# Patient Record
Sex: Female | Born: 1980 | Race: Black or African American | Hispanic: No | Marital: Married | Smoking: Current every day smoker
Health system: Southern US, Community
[De-identification: ages and names within clinical notes are randomized; demographics above are authoritative.]

## PROBLEM LIST (undated history)

## (undated) DIAGNOSIS — IMO0002 Reserved for concepts with insufficient information to code with codable children: Secondary | ICD-10-CM

## (undated) DIAGNOSIS — K219 Gastro-esophageal reflux disease without esophagitis: Secondary | ICD-10-CM

## (undated) DIAGNOSIS — I209 Angina pectoris, unspecified: Secondary | ICD-10-CM

## (undated) DIAGNOSIS — D649 Anemia, unspecified: Secondary | ICD-10-CM

## (undated) DIAGNOSIS — J45909 Unspecified asthma, uncomplicated: Secondary | ICD-10-CM

## (undated) DIAGNOSIS — M545 Low back pain, unspecified: Secondary | ICD-10-CM

## (undated) DIAGNOSIS — I639 Cerebral infarction, unspecified: Secondary | ICD-10-CM

## (undated) DIAGNOSIS — R011 Cardiac murmur, unspecified: Secondary | ICD-10-CM

## (undated) DIAGNOSIS — F209 Schizophrenia, unspecified: Secondary | ICD-10-CM

## (undated) DIAGNOSIS — I82409 Acute embolism and thrombosis of unspecified deep veins of unspecified lower extremity: Secondary | ICD-10-CM

## (undated) DIAGNOSIS — I10 Essential (primary) hypertension: Secondary | ICD-10-CM

## (undated) DIAGNOSIS — G43909 Migraine, unspecified, not intractable, without status migrainosus: Secondary | ICD-10-CM

## (undated) DIAGNOSIS — F319 Bipolar disorder, unspecified: Secondary | ICD-10-CM

## (undated) DIAGNOSIS — E1151 Type 2 diabetes mellitus with diabetic peripheral angiopathy without gangrene: Secondary | ICD-10-CM

## (undated) DIAGNOSIS — E785 Hyperlipidemia, unspecified: Secondary | ICD-10-CM

## (undated) DIAGNOSIS — Q2112 Patent foramen ovale: Secondary | ICD-10-CM

## (undated) DIAGNOSIS — G8929 Other chronic pain: Secondary | ICD-10-CM

## (undated) DIAGNOSIS — E1165 Type 2 diabetes mellitus with hyperglycemia: Secondary | ICD-10-CM

## (undated) DIAGNOSIS — R569 Unspecified convulsions: Secondary | ICD-10-CM

## (undated) DIAGNOSIS — Q211 Atrial septal defect: Secondary | ICD-10-CM

## (undated) HISTORY — PX: VSD REPAIR: SHX276

## (undated) HISTORY — PX: HERNIA REPAIR: SHX51

## (undated) HISTORY — PX: UMBILICAL HERNIA REPAIR: SHX196

---

## 2006-05-11 HISTORY — PX: TUBAL LIGATION: SHX77

## 2010-07-03 ENCOUNTER — Emergency Department (HOSPITAL_COMMUNITY)
Admission: EM | Admit: 2010-07-03 | Discharge: 2010-07-03 | Disposition: A | Discharge status: Home / Self Care | Payer: Medicaid Other | Attending: Emergency Medicine | Admitting: Emergency Medicine

## 2010-07-03 DIAGNOSIS — R109 Unspecified abdominal pain: Secondary | ICD-10-CM | POA: Insufficient documentation

## 2010-07-03 DIAGNOSIS — R000 Tachycardia, unspecified: Secondary | ICD-10-CM | POA: Insufficient documentation

## 2010-07-03 DIAGNOSIS — R197 Diarrhea, unspecified: Secondary | ICD-10-CM | POA: Insufficient documentation

## 2010-07-03 DIAGNOSIS — A599 Trichomoniasis, unspecified: Secondary | ICD-10-CM | POA: Insufficient documentation

## 2010-07-03 DIAGNOSIS — Z79899 Other long term (current) drug therapy: Secondary | ICD-10-CM | POA: Insufficient documentation

## 2010-07-03 DIAGNOSIS — R112 Nausea with vomiting, unspecified: Secondary | ICD-10-CM | POA: Insufficient documentation

## 2010-07-03 LAB — COMPREHENSIVE METABOLIC PANEL
Alkaline Phosphatase: 87 U/L (ref 39–117)
BUN: 10 mg/dL (ref 6–23)
Glucose, Bld: 73 mg/dL (ref 70–99)
Potassium: 3.9 mEq/L (ref 3.5–5.1)
Total Bilirubin: 0.5 mg/dL (ref 0.3–1.2)
Total Protein: 7.5 g/dL (ref 6.0–8.3)

## 2010-07-03 LAB — POCT PREGNANCY, URINE: Preg Test, Ur: NEGATIVE

## 2010-07-03 LAB — DIFFERENTIAL
Basophils Absolute: 0 10*3/uL (ref 0.0–0.1)
Basophils Relative: 0 % (ref 0–1)
Neutro Abs: 6 10*3/uL (ref 1.7–7.7)
Neutrophils Relative %: 70 % (ref 43–77)

## 2010-07-03 LAB — CBC
Hemoglobin: 13.9 g/dL (ref 12.0–15.0)
MCHC: 33.8 g/dL (ref 30.0–36.0)
RBC: 4.57 MIL/uL (ref 3.87–5.11)
WBC: 8.4 10*3/uL (ref 4.0–10.5)

## 2010-07-03 LAB — URINALYSIS, ROUTINE W REFLEX MICROSCOPIC
Protein, ur: 30 mg/dL — AB
Specific Gravity, Urine: 1.022 (ref 1.005–1.030)
Urine Glucose, Fasting: NEGATIVE mg/dL
pH: 5.5 (ref 5.0–8.0)

## 2010-07-03 LAB — URINE MICROSCOPIC-ADD ON

## 2010-07-04 LAB — URINE CULTURE

## 2010-11-25 ENCOUNTER — Emergency Department (HOSPITAL_COMMUNITY)
Admission: EM | Admit: 2010-11-25 | Discharge: 2010-11-25 | Disposition: A | Discharge status: Home / Self Care | Payer: Medicaid Other | Attending: Emergency Medicine | Admitting: Emergency Medicine

## 2010-11-25 DIAGNOSIS — H209 Unspecified iridocyclitis: Secondary | ICD-10-CM | POA: Insufficient documentation

## 2010-11-25 DIAGNOSIS — H53149 Visual discomfort, unspecified: Secondary | ICD-10-CM | POA: Insufficient documentation

## 2015-01-03 ENCOUNTER — Emergency Department (HOSPITAL_COMMUNITY): Payer: Medicaid Other

## 2015-01-03 ENCOUNTER — Encounter (HOSPITAL_COMMUNITY): Payer: Self-pay | Admitting: *Deleted

## 2015-01-03 ENCOUNTER — Inpatient Hospital Stay (HOSPITAL_COMMUNITY)
Admission: EM | Admit: 2015-01-03 | Discharge: 2015-01-04 | DRG: 065 | Discharge status: Against Medical Advice | Payer: Medicaid Other | Attending: Internal Medicine | Admitting: Internal Medicine

## 2015-01-03 DIAGNOSIS — J45909 Unspecified asthma, uncomplicated: Secondary | ICD-10-CM | POA: Diagnosis present

## 2015-01-03 DIAGNOSIS — Z9119 Patient's noncompliance with other medical treatment and regimen: Secondary | ICD-10-CM | POA: Diagnosis present

## 2015-01-03 DIAGNOSIS — I639 Cerebral infarction, unspecified: Secondary | ICD-10-CM | POA: Diagnosis not present

## 2015-01-03 DIAGNOSIS — Z823 Family history of stroke: Secondary | ICD-10-CM | POA: Diagnosis not present

## 2015-01-03 DIAGNOSIS — I69854 Hemiplegia and hemiparesis following other cerebrovascular disease affecting left non-dominant side: Secondary | ICD-10-CM | POA: Diagnosis not present

## 2015-01-03 DIAGNOSIS — F419 Anxiety disorder, unspecified: Secondary | ICD-10-CM | POA: Diagnosis present

## 2015-01-03 DIAGNOSIS — Z720 Tobacco use: Secondary | ICD-10-CM | POA: Diagnosis not present

## 2015-01-03 DIAGNOSIS — I69354 Hemiplegia and hemiparesis following cerebral infarction affecting left non-dominant side: Secondary | ICD-10-CM

## 2015-01-03 DIAGNOSIS — Z91012 Allergy to eggs: Secondary | ICD-10-CM

## 2015-01-03 DIAGNOSIS — G43909 Migraine, unspecified, not intractable, without status migrainosus: Secondary | ICD-10-CM | POA: Diagnosis present

## 2015-01-03 DIAGNOSIS — R4781 Slurred speech: Secondary | ICD-10-CM

## 2015-01-03 DIAGNOSIS — I10 Essential (primary) hypertension: Secondary | ICD-10-CM | POA: Diagnosis present

## 2015-01-03 DIAGNOSIS — Z7902 Long term (current) use of antithrombotics/antiplatelets: Secondary | ICD-10-CM | POA: Diagnosis not present

## 2015-01-03 DIAGNOSIS — R531 Weakness: Secondary | ICD-10-CM

## 2015-01-03 DIAGNOSIS — Z79899 Other long term (current) drug therapy: Secondary | ICD-10-CM | POA: Diagnosis not present

## 2015-01-03 DIAGNOSIS — Z8673 Personal history of transient ischemic attack (TIA), and cerebral infarction without residual deficits: Secondary | ICD-10-CM

## 2015-01-03 DIAGNOSIS — E785 Hyperlipidemia, unspecified: Secondary | ICD-10-CM | POA: Diagnosis present

## 2015-01-03 DIAGNOSIS — E119 Type 2 diabetes mellitus without complications: Secondary | ICD-10-CM | POA: Diagnosis not present

## 2015-01-03 DIAGNOSIS — I129 Hypertensive chronic kidney disease with stage 1 through stage 4 chronic kidney disease, or unspecified chronic kidney disease: Secondary | ICD-10-CM | POA: Diagnosis present

## 2015-01-03 DIAGNOSIS — Z7982 Long term (current) use of aspirin: Secondary | ICD-10-CM | POA: Diagnosis not present

## 2015-01-03 DIAGNOSIS — Z91018 Allergy to other foods: Secondary | ICD-10-CM | POA: Diagnosis not present

## 2015-01-03 DIAGNOSIS — F449 Dissociative and conversion disorder, unspecified: Secondary | ICD-10-CM | POA: Diagnosis present

## 2015-01-03 DIAGNOSIS — N182 Chronic kidney disease, stage 2 (mild): Secondary | ICD-10-CM | POA: Diagnosis present

## 2015-01-03 DIAGNOSIS — E1149 Type 2 diabetes mellitus with other diabetic neurological complication: Secondary | ICD-10-CM | POA: Diagnosis present

## 2015-01-03 DIAGNOSIS — F1210 Cannabis abuse, uncomplicated: Secondary | ICD-10-CM | POA: Diagnosis present

## 2015-01-03 DIAGNOSIS — Z86718 Personal history of other venous thrombosis and embolism: Secondary | ICD-10-CM

## 2015-01-03 DIAGNOSIS — F17200 Nicotine dependence, unspecified, uncomplicated: Secondary | ICD-10-CM | POA: Diagnosis present

## 2015-01-03 DIAGNOSIS — F329 Major depressive disorder, single episode, unspecified: Secondary | ICD-10-CM | POA: Diagnosis present

## 2015-01-03 DIAGNOSIS — E1122 Type 2 diabetes mellitus with diabetic chronic kidney disease: Secondary | ICD-10-CM | POA: Diagnosis present

## 2015-01-03 HISTORY — DX: Cerebral infarction, unspecified: I63.9

## 2015-01-03 HISTORY — DX: Essential (primary) hypertension: I10

## 2015-01-03 LAB — CBC
HCT: 42.1 % (ref 36.0–46.0)
Hemoglobin: 13.5 g/dL (ref 12.0–15.0)
MCH: 29.2 pg (ref 26.0–34.0)
MCHC: 32.1 g/dL (ref 30.0–36.0)
MCV: 90.9 fL (ref 78.0–100.0)
PLATELETS: 180 10*3/uL (ref 150–400)
RBC: 4.63 MIL/uL (ref 3.87–5.11)
RDW: 13.4 % (ref 11.5–15.5)
WBC: 5.5 10*3/uL (ref 4.0–10.5)

## 2015-01-03 LAB — ETHANOL: Alcohol, Ethyl (B): <5 mg/dL (ref <5)

## 2015-01-03 LAB — CBG MONITORING, ED: GLUCOSE-CAPILLARY: 111 mg/dL — AB (ref 65–99)

## 2015-01-03 LAB — URINALYSIS, ROUTINE W REFLEX MICROSCOPIC
Bilirubin Urine: NEGATIVE
Glucose, UA: NEGATIVE mg/dL
Hgb urine dipstick: NEGATIVE
Ketones, ur: NEGATIVE mg/dL
LEUKOCYTES UA: NEGATIVE
NITRITE: NEGATIVE
PH: 7 (ref 5.0–8.0)
Protein, ur: 30 mg/dL — AB
SPECIFIC GRAVITY, URINE: 1.012 (ref 1.005–1.030)
Urobilinogen, UA: 0.2 mg/dL (ref 0.0–1.0)

## 2015-01-03 LAB — RAPID URINE DRUG SCREEN, HOSP PERFORMED
Amphetamines: NOT DETECTED
Barbiturates: NOT DETECTED
Benzodiazepines: POSITIVE — AB
COCAINE: NOT DETECTED
OPIATES: NOT DETECTED
TETRAHYDROCANNABINOL: POSITIVE — AB

## 2015-01-03 LAB — I-STAT CHEM 8, ED
BUN: 12 mg/dL (ref 6–20)
Calcium, Ion: 1.24 mmol/L — ABNORMAL HIGH (ref 1.12–1.23)
Chloride: 105 mmol/L (ref 101–111)
Creatinine, Ser: 1.2 mg/dL — ABNORMAL HIGH (ref 0.44–1.00)
Glucose, Bld: 76 mg/dL (ref 65–99)
HEMATOCRIT: 47 % — AB (ref 36.0–46.0)
HEMOGLOBIN: 16 g/dL — AB (ref 12.0–15.0)
Potassium: 4.2 mmol/L (ref 3.5–5.1)
SODIUM: 142 mmol/L (ref 135–145)
TCO2: 24 mmol/L (ref 0–100)

## 2015-01-03 LAB — DIFFERENTIAL
Basophils Absolute: 0 10*3/uL (ref 0.0–0.1)
Basophils Relative: 1 % (ref 0–1)
EOS ABS: 0.2 10*3/uL (ref 0.0–0.7)
EOS PCT: 4 % (ref 0–5)
LYMPHS ABS: 1.9 10*3/uL (ref 0.7–4.0)
Lymphocytes Relative: 35 % (ref 12–46)
Monocytes Absolute: 0.5 10*3/uL (ref 0.1–1.0)
Monocytes Relative: 8 % (ref 3–12)
NEUTROS PCT: 52 % (ref 43–77)
Neutro Abs: 2.9 10*3/uL (ref 1.7–7.7)

## 2015-01-03 LAB — APTT: aPTT: 29 seconds (ref 24–37)

## 2015-01-03 LAB — COMPREHENSIVE METABOLIC PANEL
ALBUMIN: 4.1 g/dL (ref 3.5–5.0)
ALT: 17 U/L (ref 14–54)
ANION GAP: 7 (ref 5–15)
AST: 28 U/L (ref 15–41)
Alkaline Phosphatase: 105 U/L (ref 38–126)
BILIRUBIN TOTAL: 0.6 mg/dL (ref 0.3–1.2)
BUN: 12 mg/dL (ref 6–20)
CO2: 26 mmol/L (ref 22–32)
Calcium: 9.5 mg/dL (ref 8.9–10.3)
Chloride: 108 mmol/L (ref 101–111)
Creatinine, Ser: 1.32 mg/dL — ABNORMAL HIGH (ref 0.44–1.00)
GFR calc non Af Amer: 52 mL/min — ABNORMAL LOW (ref >60)
GLUCOSE: 79 mg/dL (ref 65–99)
POTASSIUM: 4.5 mmol/L (ref 3.5–5.1)
Sodium: 141 mmol/L (ref 135–145)
TOTAL PROTEIN: 7.8 g/dL (ref 6.5–8.1)

## 2015-01-03 LAB — GLUCOSE, CAPILLARY
GLUCOSE-CAPILLARY: 83 mg/dL (ref 65–99)
Glucose-Capillary: 87 mg/dL (ref 65–99)

## 2015-01-03 LAB — PROTIME-INR
INR: 0.88 (ref 0.00–1.49)
PROTHROMBIN TIME: 12.2 s (ref 11.6–15.2)

## 2015-01-03 LAB — URINE MICROSCOPIC-ADD ON

## 2015-01-03 LAB — I-STAT BETA HCG BLOOD, ED (MC, WL, AP ONLY)

## 2015-01-03 LAB — POC OCCULT BLOOD, ED: FECAL OCCULT BLD: NEGATIVE

## 2015-01-03 LAB — I-STAT TROPONIN, ED: TROPONIN I, POC: 0.01 ng/mL (ref 0.00–0.08)

## 2015-01-03 LAB — I-STAT CG4 LACTIC ACID, ED: LACTIC ACID, VENOUS: 1.39 mmol/L (ref 0.5–2.0)

## 2015-01-03 MED ORDER — GADOBENATE DIMEGLUMINE 529 MG/ML IV SOLN
15.0000 mL | Freq: Once | INTRAVENOUS | Status: AC | PRN
  Administered 2015-01-03: 15 mL via INTRAVENOUS

## 2015-01-03 MED ORDER — ASPIRIN 300 MG RE SUPP
300.0000 mg | Freq: Every day | RECTAL | Status: DC
Start: 2015-01-03 — End: 2015-01-04
  Administered 2015-01-03: 300 mg via RECTAL
  Filled 2015-01-03 (×2): qty 1

## 2015-01-03 MED ORDER — SODIUM CHLORIDE 0.9 % IV SOLN
INTRAVENOUS | Status: AC
  Administered 2015-01-03: 21:00:00 via INTRAVENOUS

## 2015-01-03 MED ORDER — INSULIN ASPART 100 UNIT/ML ~~LOC~~ SOLN: 0.0000 [IU] | SUBCUTANEOUS | Status: DC

## 2015-01-03 MED ORDER — DIPHENHYDRAMINE HCL 50 MG/ML IJ SOLN
12.5000 mg | Freq: Once | INTRAMUSCULAR | Status: AC
  Administered 2015-01-03: 12.5 mg via INTRAVENOUS
  Filled 2015-01-03: qty 1

## 2015-01-03 MED ORDER — ENOXAPARIN SODIUM 40 MG/0.4ML ~~LOC~~ SOLN
40.0000 mg | SUBCUTANEOUS | Status: DC
  Administered 2015-01-03: 40 mg via SUBCUTANEOUS
  Filled 2015-01-03: qty 0.4

## 2015-01-03 MED ORDER — ACETAMINOPHEN 650 MG RE SUPP
650.0000 mg | RECTAL | Status: DC | PRN
  Administered 2015-01-04: 650 mg via RECTAL
  Filled 2015-01-03: qty 1

## 2015-01-03 MED ORDER — CLOPIDOGREL BISULFATE 75 MG PO TABS
75.0000 mg | ORAL_TABLET | Freq: Every day | ORAL | Status: DC
  Administered 2015-01-04: 75 mg via ORAL
  Filled 2015-01-03: qty 1

## 2015-01-03 MED ORDER — HYDRALAZINE HCL 20 MG/ML IJ SOLN: 10.0000 mg | Freq: Four times a day (QID) | INTRAMUSCULAR | Status: DC | PRN

## 2015-01-03 MED ORDER — STROKE: EARLY STAGES OF RECOVERY BOOK
Freq: Once | Status: AC
  Administered 2015-01-03: 20:00:00

## 2015-01-03 MED ORDER — PROCHLORPERAZINE EDISYLATE 5 MG/ML IJ SOLN
10.0000 mg | Freq: Once | INTRAMUSCULAR | Status: AC
  Administered 2015-01-03: 10 mg via INTRAVENOUS
  Filled 2015-01-03: qty 2

## 2015-01-03 NOTE — ED Notes (Addendum)
Pt husband has been gone for estiamted 2 hours. Pt reports husband went to talk to a Clinical research associate.  pts black cell phone in pt belonging bag, along with her usb port with medial info, clothes, shoes.

## 2015-01-03 NOTE — ED Provider Notes (Signed)
CSN: [id number]     Arrival date & time 01/03/15  1127 History   First MD Initiated Contact with Patient 01/03/15 1142     Chief Complaint  Patient presents with  . Hypertension     (Consider location/radiation/quality/duration/timing/severity/associated sxs/prior Treatment) The history is provided by the patient, a significant other and medical records. No language interpreter was used.     Tanya Mayer is a 34 y.o. 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  Patient significant other is in the room. She was last seen well at 8:15 last night. 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 She denies known history of clotting disorder. She reports she is currently seeing spots.  Associated symptoms include rectal bleeding. Patient reports that she was evaluated for this in [place] and was told this was from hemorrhoids.   She has been in [place] 1 week and has not established care with a primary care provider or neurologist.      Past Medical History  Diagnosis Date  . Diabetes mellitus without complication   . Hypertension   . Stroke     hx of- left sided deficits   No past surgical history on file. No family history on file. Social History  Substance Use Topics  . Smoking status: Not on file  . Smokeless tobacco: Not on file  . Alcohol Use: Not on file   OB History    No data available     Review of Systems  Constitutional: Negative for fever, diaphoresis, appetite change, fatigue and unexpected weight change.  HENT: Negative for mouth sores.   Eyes: Negative for visual disturbance.  Respiratory: Negative for cough, chest tightness, shortness of breath and  wheezing.   Cardiovascular: Negative for chest pain.  Gastrointestinal: Negative for nausea, vomiting, abdominal pain, diarrhea and constipation.  Endocrine: Negative for polydipsia, polyphagia and polyuria.  Genitourinary: Negative for dysuria, urgency, frequency and hematuria.  Musculoskeletal: Negative for back pain and neck stiffness.  Skin: Negative for rash.  Allergic/Immunologic: Negative for immunocompromised state.  Neurological: Positive for speech difficulty and weakness. Negative for syncope, light-headedness and headaches.  Hematological: Does not bruise/bleed easily.  Psychiatric/Behavioral: Negative for sleep disturbance. The patient is not nervous/anxious.       Allergies  Carrot; Eggs or egg-derived products; and Mushroom extract complex  Home Medications   Prior to Admission medications   Medication Sig Start Date End Date Taking? Authorizing Provider  acetaminophen (TYLENOL) 500 MG tablet Take 500 mg by mouth every 6 (six) hours as needed for moderate pain.   Yes Historical Provider, MD  ALPRAZolam Prudy Feeler) 1 MG tablet Take 1 mg by mouth 2 (two) times daily as needed for anxiety.   Yes Historical Provider, MD  aspirin 81 MG tablet Take 81 mg by mouth daily.   Yes Historical Provider, MD  clopidogrel (PLAVIX) 75 MG tablet Take 75 mg by mouth daily.   Yes Historical Provider, MD  gabapentin (NEURONTIN) 300 MG capsule Take 900 mg by mouth 3 (three) times daily.   Yes Historical Provider, MD  hydrOXYzine (VISTARIL) 50 MG capsule Take 50 mg by mouth at bedtime.   Yes Historical Provider, MD  polyethylene glycol (MIRALAX / GLYCOLAX) packet Take 17 g by mouth daily.  Yes Historical Provider, MD  QUEtiapine (SEROQUEL) 300 MG tablet Take 300 mg by mouth at bedtime.   Yes Historical Provider, MD  risperiDONE (RISPERDAL) 0.25 MG tablet Take 0.25 mg by mouth 2 (two) times daily.   Yes Historical Provider, MD  trazodone (DESYREL) 300 MG tablet Take 300 mg by mouth at bedtime.    Yes Historical Provider, MD   BP 146/107 mmHg  Pulse 58  Temp(Src) 98.4 F (36.9 C) (Oral)  Resp 22  SpO2 100%  LMP 12/13/2014 (Approximate) Physical Exam  Constitutional: She is oriented to person, place, and time. She appears well-developed and well-nourished. No distress.  HENT:  Head: Normocephalic and atraumatic.  Mouth/Throat: Oropharynx is clear and moist.  Eyes: Conjunctivae and EOM are normal. Pupils are equal, round, and reactive to light. No scleral icterus.  No horizontal, vertical or rotational nystagmus  Neck: Normal range of motion. Neck supple.  Full active and passive ROM without pain No midline or paraspinal tenderness No nuchal rigidity or meningeal signs  Cardiovascular: Normal rate, regular rhythm, normal heart sounds and intact distal pulses.   No murmur heard. Pulmonary/Chest: Effort normal and breath sounds normal. No respiratory distress. She has no wheezes. She has no rales.  Abdominal: Soft. Bowel sounds are normal. There is no tenderness. There is no rebound and no guarding.  Musculoskeletal: Normal range of motion.  Lymphadenopathy:    She has no cervical adenopathy.  Neurological: She is alert and oriented to person, place, and time. No cranial nerve deficit. She exhibits normal muscle tone. Coordination normal.  Mental Status:  Alert, oriented, thought content appropriate. Slurred speech.  Able to follow 2 step commands without difficulty.  Cranial Nerves:  II: pupils equal, round, reactive to light III,IV, VI: ptosis not present, extra-ocular motions intact bilaterally  V,VII: Minimal left-sided facial droop noted at the corner of the mouth, facial light touch sensation equal VIII: hearing grossly normal bilaterally  IX,X: midline uvula rise  XI: bilateral shoulder shrug equal and strong XII: midline tongue extension  Motor:  4/5 in left upper and lower extremities including grip strength and dorsiflexion/plantar flexion 5/5 in left upper and  lower extremities including grip strength and dorsiflexion/plantar flexion Sensory: Pinprick and light touch normal in all extremities.  Cerebellar: normal finger-to-nose with RUE, unable to perform with left Gait: gait testing deferred CV: distal pulses palpable throughout  No Clonus  Skin: Skin is warm and dry. No rash noted. She is not diaphoretic.  Psychiatric: She has a normal mood and affect. Her behavior is normal. Judgment and thought content normal.  Nursing note and vitals reviewed.   ED Course  Procedures (including critical care time) Labs Review Labs Reviewed  COMPREHENSIVE METABOLIC PANEL - Abnormal; Notable for the following:    Creatinine, Ser 1.32 (*)    GFR calc non Af Amer 52 (*)    All other components within normal limits  URINE RAPID DRUG SCREEN, HOSP PERFORMED - Abnormal; Notable for the following:    Benzodiazepines POSITIVE (*)    Tetrahydrocannabinol POSITIVE (*)    All other components within normal limits  URINALYSIS, ROUTINE W REFLEX MICROSCOPIC (NOT AT [facility]) - Abnormal; Notable for the following:    APPearance CLOUDY (*)    Protein, ur 30 (*)    All other components within normal limits  URINE MICROSCOPIC-ADD ON - Abnormal; Notable for the following:    Bacteria, UA FEW (*)    All other components within normal limits  CBG MONITORING, ED - Abnormal;  Notable for the following:    Glucose-Capillary 111 (*)    All other components within normal limits  I-STAT CHEM 8, ED - Abnormal; Notable for the following:    Creatinine, Ser 1.20 (*)    Calcium, Ion 1.24 (*)    Hemoglobin 16.0 (*)    HCT 47.0 (*)    All other components within normal limits  ETHANOL  PROTIME-INR  APTT  CBC  DIFFERENTIAL  I-STAT TROPOININ, ED  I-STAT BETA HCG BLOOD, ED ([facility], [facility], [facility] ONLY)  CBG MONITORING, ED  POC OCCULT BLOOD, ED  I-STAT CG4 LACTIC ACID, ED    Imaging Review Dg Chest 2 View  01/03/2015   CLINICAL DATA:  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 There is no radio opaque or metallic valve demonstrated. No metallic structures are demonstrated within the thorax. There is a butane cigarette lighter overlying the left axillary and left breast region. The lungs are well-expanded and clear. The pulmonary vascularity is normal. The bony thorax is unremarkable.  IMPRESSION: No radiopaque prosthetic valve or other radiopaque implanted device is demonstrated. A butane cigarette lighter is visible overlying the soft tissues over the left aspect of the thorax.   Electronically Signed   By: David  Swaziland M.D.   On: 01/03/2015 15:25   Ct Head Wo Contrast  01/03/2015   CLINICAL DATA:  Slurred speech at 7 a.m. today. Previous strokes with left-sided deficits.  EXAM: CT HEAD WITHOUT CONTRAST  TECHNIQUE: Contiguous axial images were obtained from the base of the skull through the vertex without intravenous contrast.  COMPARISON:  None.  FINDINGS: Normal appearing cerebral hemispheres and posterior fossa structures. Normal size and position of the ventricles. No intracranial hemorrhage, mass lesion or CT evidence of acute infarction. Unremarkable bones and included paranasal sinuses.  IMPRESSION: Normal examination.   Electronically Signed   By: Beckie Salts M.D.   On: 01/03/2015 13:50   I have personally reviewed and evaluated these images and lab results as part of my medical decision-making.   EKG Interpretation   Date/Time:  Thursday January 03 2015 11:46:26 EDT Ventricular Rate:  72 PR Interval:  170 QRS Duration: 69 QT Interval:  389 QTC Calculation: 426 R Axis:   92 Text Interpretation:  Sinus rhythm Borderline right axis deviation ST  elevation, consider inferior injury Baseline wander in lead(s) II III aVR  aVL aVF No old tracing to compare Confirmed by CAMPOS  MD, Caryn Bee ([id number])  on 01/03/2015 11:48:52 AM      MDM    Final diagnoses:  Left-sided weakness  Slurred speech  H/O: CVA (cerebrovascular accident)   Tanya Mayer presents with increasing left-sided weakness and slurred speech onset upon awaking this morning. Her last seen normal was last night prior to bed.  Patient with reported history of CVA.  Slurred speech and left-sided weakness noted on exam. Patient with baseline left-sided weakness therefore it unclear whether or not this is worse today or not. Patient is alert and oriented.    2:00 PM  CT head without acute abnormality.  Pending MRI. Labs reassuring.  4:09 PM Labs reassuring.  Pt pending MRI.  Care transferred to Sharilyn Sites, PA-C who will follow MRI.  If negative, pt may be d/c to home.    The patient was discussed with Dr. Patria Mane who agrees with the treatment  plan.    Dierdre Forth, PA-C 01/03/15 1610  Azalia Bilis, MD 01/03/15 [phone]

## 2015-01-03 NOTE — ED Notes (Signed)
Pt back from x-ray.

## 2015-01-03 NOTE — ED Notes (Addendum)
Per ems pt is from [facility], pt is homeless. Hx of stroke (left side deficits) (pt left handed), HTN, and DM. Pt has not had any meds in 3 months. Nurse at [facility] checked BP (158/116).    The [facility] nurse referral form states "pt has been without meds for 1 month. Hx of multiple strokes. BP 158/116, CBG 83-diabetes on inuslin, has not had med insulin  In over a month. Please consider script for hypertension. [facility] for script at [facility] is you call over there. Alda Lea, RN- [phone]"  Upon rn assessment, pt last seen normal last night, woke up and husband noticed around 0700 pt was slurring speech. Pt has hx of migraines, started seeing spots this morning. Reports 3 months ago came to ED for migraine and was told she had a stroke, blood clot on right side of brain, now has left sided weakness,deficit. Hx of 5 strokes. Pt smile almost symmetrical upon arrival to ED.   At 1233 now smile extremely asymetrically, pt reports increased weakness in left arm and leg. Reports her face feels "funny". Speech getting more slurred. Headache 10/10. Still seeing spots. rn holding up 2 fingers asking pt, pt said 3 fingers. Each time rn does test pt cannot determine correct amount of fingers being held up.

## 2015-01-03 NOTE — Progress Notes (Signed)
Patient listed as having [employer] insurance without a pcp.  EDCM went to speak to patient at bedside, however patient was asleep.  EDCM did not wake patient at this time.

## 2015-01-03 NOTE — ED Notes (Signed)
Report given to Victorino Dike, RN  [facility] called

## 2015-01-03 NOTE — H&P (Signed)
History and Physical  Calle Schader ZOX:[id number] DOB: 1980-08-14 DOA: 01/03/2015  Referring physician: Sharilyn Sites, ED PA-C PCP: No primary care provider on file.  Outpatient Specialists:  1. None  Chief Complaint: Slurred speech and left-sided weakness  HPI: Tanya Mayer is a 34 y.o. female , married, homeless and lived in [place] until a week ago when she moved to [facility] in [place], history of recurrent CVA and residual left hemiparesis, ambulates with the help of a walker, DM 2/IDDM, HTN, HLD, Asthma, recurrent DVT- timing of the last episode not clear,? 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 Patient is somnolent due to medications she received prior to MRI and for headache but is easily arousable and provided history. No family at bedside. Last seen well at 8:15 PM on 01/02/15. This morning her spouse noticed slurred speech and worsening left-sided weakness. Blood pressure was apparently checked at residence and was told to be high. She denies headache currently or visual symptoms. No history of chest pain, palpitations, dyspnea, asymmetric leg swelling or pain. Rectal bleeding documented under EDP notes which patient denies. She does however give history of hemorrhoids. She states that she has not taken any of her medications for greater than 1 month. In the ED, noted high blood pressure, lab work significant for creatinine of 1.3 to, CT head normal, MRI brain shows 2 probable punctate left frontal white matter infarcts. Neurology has consulted and recommend hospitalist admission and transfer to [facility] for further evaluation and management.   Review of Systems: All systems reviewed and apart from history of presenting illness, are negative.  Past Medical History  Diagnosis Date  . Diabetes mellitus without complication     . Hypertension   . Stroke     hx of- left sided deficits   Past Surgical History  Procedure Laterality Date  . 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  She does not have any smokeless tobacco history on file. She reports that she uses illicit drugs (Marijuana). She reports that she does not drink alcohol. Patient states that she has been smoking 1.5 packs of cigarettes since age 68. Denies alcohol use. States that she uses marijuana to control left-sided pain from stroke. Denies other recreational drug abuse.  Allergies  Allergen Reactions  . Carrot [Daucus Carota] Anaphylaxis  . Eggs Or Egg-Derived Products Anaphylaxis and Swelling  . Mushroom Extract Complex Anaphylaxis    History reviewed. No pertinent family history. family history was asked and patient denies.  Prior to Admission medications   Medication Sig Start Date End Date Taking? Authorizing Provider  acetaminophen (TYLENOL) 500 MG tablet Take 500 mg by mouth every 6 (six) hours as needed for moderate pain.   Yes Historical Provider, MD  ALPRAZolam Prudy Feeler) 1 MG tablet Take 1 mg by mouth 2 (two) times daily as needed for anxiety.   Yes Historical Provider, MD  aspirin 81 MG tablet Take 81 mg by mouth daily.   Yes Historical Provider, MD  clopidogrel (PLAVIX) 75 MG tablet Take 75 mg by mouth daily.   Yes Historical Provider, MD  gabapentin (NEURONTIN) 300 MG capsule Take 900 mg by mouth 3 (three) times daily.   Yes Historical Provider, MD  hydrOXYzine (VISTARIL) 50 MG capsule Take 50 mg by mouth at bedtime.  Yes Historical Provider, MD  polyethylene glycol (MIRALAX / GLYCOLAX) packet Take 17 g by mouth daily.   Yes Historical Provider, MD  QUEtiapine (SEROQUEL) 300 MG tablet Take 300 mg by mouth at bedtime.   Yes Historical Provider, MD  risperiDONE (RISPERDAL) 0.25 MG tablet Take 0.25 mg by mouth 2 (two) times daily.   Yes Historical Provider, MD  trazodone (DESYREL) 300 MG  tablet Take 300 mg by mouth at bedtime.   Yes Historical Provider, MD   Physical Exam: Filed Vitals:   01/03/15 1500 01/03/15 1540 01/03/15 1541 01/03/15 1645  BP: 140/117 146/107 146/107 148/105  Pulse: 65 62 58 69  Temp:   98.4 F (36.9 C) 98.1 F (36.7 C)  TempSrc:    Oral  Resp: 16 17 22 23   SpO2: 99% 99% 100% 99%   Patient was examined with a female nursing aide as chaperone in the room.  General exam: Moderately built and nourished pleasant young female patient, lying comfortably supine on the gurney in no obvious distress.  Head, eyes and ENT: Nontraumatic and normocephalic. Pupils equally reacting to light and accommodation. Oral mucosa slightly dry.  Neck: Supple. No JVD, carotid bruit or thyromegaly.  Lymphatics: No lymphadenopathy.  Respiratory system: Clear to auscultation. No increased work of breathing.  Cardiovascular system: S1 and S2 heard, RRR. No JVD, murmurs, gallops, clicks or pedal edema.  Gastrointestinal system: Abdomen is nondistended, soft and nontender. Normal bowel sounds heard. No organomegaly or masses appreciated.  Central nervous system: Somnolent but easily arousable and oriented 3 and follows instructions appropriately. Dysarthria +. Facial asymmetry + (decrease prominence of left nasolabial fold)  Extremities: 5 x 5 power of right limbs, LUE grade 1 x 5 power and LLE grade 3 x 5 power at least. Peripheral pulses symmetrically felt.   Skin: No rashes or acute findings.  Musculoskeletal system: Negative exam.  Psychiatry: Pleasant and cooperative.   Labs on Admission:  Basic Metabolic Panel:  Recent Labs Lab 01/03/15 1248 01/03/15 1251  NA 141 142  K 4.5 4.2  CL 108 105  CO2 26  --   GLUCOSE 79 76  BUN 12 12  CREATININE 1.32* 1.20*  CALCIUM 9.5  --    Liver Function Tests:  Recent Labs Lab 01/03/15 1248  AST 28  ALT 17  ALKPHOS 105  BILITOT 0.6  PROT 7.8  ALBUMIN 4.1   No results for input(s): LIPASE, AMYLASE in  the last 168 hours. No results for input(s): AMMONIA in the last 168 hours. CBC:  Recent Labs Lab 01/03/15 1248 01/03/15 1251  WBC 5.5  --   NEUTROABS 2.9  --   HGB 13.5 16.0*  HCT 42.1 47.0*  MCV 90.9  --   PLT 180  --    Cardiac Enzymes: No results for input(s): CKTOTAL, CKMB, CKMBINDEX, TROPONINI in the last 168 hours.  BNP (last 3 results) No results for input(s): PROBNP in the last 8760 hours. 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 There is no radio opaque or metallic valve demonstrated. No metallic structures are demonstrated within the thorax. There is a butane cigarette lighter overlying the left axillary and left breast region. The lungs  are well-expanded and clear. The pulmonary vascularity is normal. The bony thorax is unremarkable.  IMPRESSION: No radiopaque prosthetic valve or other radiopaque implanted device is demonstrated. A butane cigarette lighter is visible overlying the soft tissues over the left aspect of the thorax.   Electronically Signed   By: David  Swaziland M.D.   On: 01/03/2015 15:25   Ct Head Wo Contrast  01/03/2015   CLINICAL DATA:  Slurred speech at 7 a.m. today. Previous strokes with left-sided deficits.  EXAM: CT HEAD WITHOUT CONTRAST  TECHNIQUE: Contiguous axial images were obtained from the base of the skull through the vertex without intravenous contrast.  COMPARISON:  None.  FINDINGS: Normal appearing cerebral hemispheres and posterior fossa structures. Normal size and position of the ventricles. No intracranial hemorrhage, mass lesion or CT evidence of acute infarction. Unremarkable bones and included paranasal sinuses.  IMPRESSION: Normal examination.   Electronically Signed    By: Beckie Salts M.D.   On: 01/03/2015 13:50   Mr Laqueta Jean WU Contrast  01/03/2015   CLINICAL DATA:  Headache and history of CVA.  EXAM: MRI HEAD WITHOUT AND WITH CONTRAST  TECHNIQUE: Multiplanar, multiecho pulse sequences of the brain and surrounding structures were obtained without and with intravenous contrast.  CONTRAST:  15mL MULTIHANCE GADOBENATE DIMEGLUMINE 529 MG/ML IV SOLN  COMPARISON:  None.  FINDINGS: Calvarium and upper cervical spine: No focal marrow signal abnormality.  Orbits: No significant findings.  Sinuses and Mastoids: Clear. Mastoid and middle ears are clear.  Brain: 2 small (~3 mm) DWI hyperintense foci on coronal imaging in the left frontal white matter which are suspicious for acute infarcts. These are not clearly localized on anatomic sequences. No evidence of major vessel occlusion or intracranial hemorrhage. Small T2 hyperintense defect present in the left caudate head which could reflect the patient's reported prior strokes.  Normal brain volume.  No hydrocephalus, hemorrhage, or mass lesion.  IMPRESSION: Two probable punctate left frontal white matter infarcts, uncertainty related to small size.   Electronically Signed   By: Marnee Spring M.D.   On: 01/03/2015 16:51    EKG: Independently reviewed. Sinus rhythm without acute changes. QTC 426 ms.  Assessment/Plan Principal Problem:   Stroke Active Problems:   Essential hypertension   DM (diabetes mellitus) type II controlled, neurological manifestation   Hyperlipidemia   Hemiparesis affecting left side as late effect of cerebrovascular accident   Tobacco abuse   Marijuana abuse   Asthma   History of DVT of lower extremity   Possible acute left brain stroke - Patient's symptoms appear to be disproportionate to severity of MRI findings - Could patient have worsening stroke like presentation due to uncontrolled hypertension rather than acute stroke? - Last seen well on 01/02/15 at 8:15 PM - Supposed to be on aspirin  and Plavix but has not taken in greater than one month - will admit and transferred to [facility] telemetry for follow-up by stroke service in a.m. as per neurology recommendations - Complete stroke workup: MRA brain, 2-D echo, carotid Dopplers, fasting lipids and hemoglobin A1c - As per ED RN, patient failed swallow screen. - PT, OT and ST evaluation. Diet pending ST input - Resume aspirin and Plavix for secondary stroke prophylaxis - Etiology of prior strokes unclear. Requesting medical records from PCP/hospitalization in [place]  Essential hypertension - Uncontrolled. Allow permissive hypertension due to possible acute stroke.   Type II DM with vascular complications - Patient states that she was on Levemir 10 units twice a  day but has been noncompliant. Blood sugars look reasonable. We'll place on NovoLog SSI. Check A1c.  Hyperlipidemia - Check statins  Tobacco abuse - Cessation counseled. Patient declines nicotine patch  Marijuana abuse - Cessation counseled  Asthma - Stable  History of recurrent DVT - Last episode unknown. Only on aspirin and Plavix in the recent past. Obtain outside medical records. Place on Lovenox for DVT prophylaxis  Stage II chronic kidney disease or AKI - Brief IV fluid hydration and follow BMP in a.m.  Medical noncompliance  Mental health-? History of anxiety, depression - Has not been on medications for greater than a month     DVT prophylaxis: Lovenox Code Status:  Full  Family Communication:  None at bedside  Disposition Plan:  Transfer to [facility] inpatient telemetry for further evaluation. DC home when medically stable.   Time spent:  60 minutes  Isaul Landi, MD, FACP, FHM. [facility] Pager [phone]  If 7PM-7AM, please contact night-coverage [email or web address] Password [facility] 01/03/2015, 6:13 PM

## 2015-01-03 NOTE — ED Notes (Signed)
Bed: ZO10 Expected date:  Expected time:  Means of arrival:  Comments: [id number]-htn

## 2015-01-03 NOTE — Progress Notes (Signed)
Received report on patient -

## 2015-01-03 NOTE — ED Provider Notes (Signed)
Patient received in sign out from PA Muthersbaugh at shift change.  Briefly, 34 y.o. 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  LKW 0815 this morning.  Patient is supposed to be taking plavix and ASA, uncertain as to whether or not she is taking these regularly.  Was hypertensive on arrival here.  CT head and lab work overall reassuring here.  Plan:  MRI pending to r/o stroke.  If negative, may d/c home.  Results for orders placed or performed during the hospital encounter of 01/03/15  Ethanol  Result Value Ref Range   Alcohol, Ethyl (B) <5 <5 mg/dL  Protime-INR  Result Value Ref Range   Prothrombin Time 12.2 11.6 - 15.2 seconds   INR 0.88 0.00 - 1.49  APTT  Result Value Ref Range   aPTT 29 24 - 37 seconds  CBC  Result Value Ref Range   WBC 5.5 4.0 - 10.5 K/uL   RBC 4.63 3.87 - 5.11 MIL/uL   Hemoglobin 13.5 12.0 - 15.0 g/dL   HCT 16.1 09.6 - 04.5 %   MCV 90.9 78.0 - 100.0 fL   MCH 29.2 26.0 - 34.0 pg   MCHC 32.1 30.0 - 36.0 g/dL   RDW 40.9 81.1 - 91.4 %   Platelets 180 150 - 400 K/uL  Differential  Result Value Ref Range   Neutrophils Relative % 52 43 - 77 %   Neutro Abs 2.9 1.7 - 7.7 K/uL   Lymphocytes Relative 35 12 - 46 %   Lymphs Abs 1.9 0.7 - 4.0 K/uL   Monocytes Relative 8 3 - 12 %   Monocytes Absolute 0.5 0.1 - 1.0 K/uL   Eosinophils Relative 4 0 - 5 %   Eosinophils Absolute 0.2 0.0 - 0.7 K/uL   Basophils Relative 1 0 - 1 %   Basophils Absolute 0.0 0.0 - 0.1 K/uL  Comprehensive metabolic panel  Result Value Ref Range   Sodium 141 135 - 145 mmol/L   Potassium 4.5 3.5 - 5.1 mmol/L   Chloride 108 101 - 111 mmol/L   CO2 26 22 - 32 mmol/L   Glucose, Bld 79 65 - 99 mg/dL   BUN 12 6 - 20 mg/dL   Creatinine, Ser 7.82 (H) 0.44 - 1.00 mg/dL   Calcium 9.5 8.9 - 95.6 mg/dL   Total Protein 7.8 6.5 - 8.1 g/dL   Albumin 4.1 3.5 - 5.0 g/dL   AST 28 15 - 41 U/L   ALT  17 14 - 54 U/L   Alkaline Phosphatase 105 38 - 126 U/L   Total Bilirubin 0.6 0.3 - 1.2 mg/dL   GFR calc non Af Amer 52 (L) >60 mL/min   GFR calc Af Amer >60 >60 mL/min   Anion gap 7 5 - 15  Urine rapid drug screen (hosp performed)not at [facility]  Result Value Ref Range   Opiates NONE DETECTED NONE DETECTED   Cocaine NONE DETECTED NONE DETECTED   Benzodiazepines POSITIVE (A) NONE DETECTED   Amphetamines NONE DETECTED NONE DETECTED   Tetrahydrocannabinol POSITIVE (A) NONE DETECTED   Barbiturates NONE DETECTED NONE DETECTED  Urinalysis, Routine w reflex microscopic (not at [facility])  Result Value Ref Range   Color, Urine YELLOW YELLOW   APPearance CLOUDY (A) CLEAR   Specific Gravity, Urine 1.012 1.005 - 1.030   pH 7.0 5.0 - 8.0   Glucose, UA NEGATIVE NEGATIVE mg/dL  Hgb urine dipstick NEGATIVE NEGATIVE   Bilirubin Urine NEGATIVE NEGATIVE   Ketones, ur NEGATIVE NEGATIVE mg/dL   Protein, ur 30 (A) NEGATIVE mg/dL   Urobilinogen, UA 0.2 0.0 - 1.0 mg/dL   Nitrite NEGATIVE NEGATIVE   Leukocytes, UA NEGATIVE NEGATIVE  Urine microscopic-add on  Result Value Ref Range   RBC / HPF 0-2 <3 RBC/hpf   Bacteria, UA FEW (A) RARE  CBG monitoring, ED  Result Value Ref Range   Glucose-Capillary 111 (H) 65 - 99 mg/dL   Comment 1 Notify RN    Comment 2 Document in Chart   I-Stat Chem 8, ED  (not at [facility])  Result Value Ref Range   Sodium 142 135 - 145 mmol/L   Potassium 4.2 3.5 - 5.1 mmol/L   Chloride 105 101 - 111 mmol/L   BUN 12 6 - 20 mg/dL   Creatinine, Ser 1.61 (H) 0.44 - 1.00 mg/dL   Glucose, Bld 76 65 - 99 mg/dL   Calcium, Ion 0.96 (H) 1.12 - 1.23 mmol/L   TCO2 24 0 - 100 mmol/L   Hemoglobin 16.0 (H) 12.0 - 15.0 g/dL   HCT 04.5 (H) 40.9 - 81.1 %  I-stat troponin, ED (not at [facility], [facility]  Result Value Ref Range   Troponin i, poc 0.01 0.00 - 0.08 ng/mL   Comment 3          I-Stat Beta hCG blood, ED ([facility], [facility], [facility] only)  Result Value Ref Range   I-stat hCG, quantitative <5.0 <5 mIU/mL    Comment 3          POC occult blood, ED RN will collect  Result Value Ref Range   Fecal Occult Bld NEGATIVE NEGATIVE  I-Stat CG4 Lactic Acid, ED  Result Value Ref Range   Lactic Acid, Venous 1.39 0.5 - 2.0 mmol/L   Dg Chest 2 View  01/03/2015   CLINICAL DATA:  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 There is no radio opaque or metallic valve demonstrated. No metallic structures are demonstrated within the thorax. There is a butane cigarette lighter overlying the left axillary and left breast region. The lungs are well-expanded and clear. The pulmonary vascularity is normal. The bony thorax is unremarkable.  IMPRESSION: No radiopaque prosthetic valve or other radiopaque implanted device is demonstrated. A butane cigarette lighter is visible overlying the soft tissues over the left aspect of the thorax.   Electronically Signed   By: David  [place]az[place]and M.D.   On: 01/03/2015 15:25   Ct Head Wo Contrast  01/03/2015   CLINICAL DATA:  Slurred speech at 7 a.m. today. Previous strokes with left-sided deficits.  EXAM: CT HEAD WITHOUT CONTRAST  TECHNIQUE: Contiguous axial images were obtained from the base of the skull through the vertex without intravenous contrast.  COMPARISON:  None.  FINDINGS: Normal appearing cerebral hemispheres and posterior fossa structures. Normal size and position of the ventricles. No intracranial hemorrhage, mass lesion or CT evidence of acute infarction. Unremarkable bones and included paranasal sinuses.  IMPRESSION: Normal examination.   Electronically Signed   By: Beckie Salts M.D.   On: 01/03/2015 13:50   Mr Laqueta Jean BJ Contrast  01/03/2015   CLINICAL DATA:  Headache and history of CVA.  EXAM: MRI HEAD WITHOUT AND WITH CONTRAST  TECHNIQUE: Multiplanar, multiecho pulse sequences of the brain  and surrounding structures were obtained without and with  intravenous contrast.  CONTRAST:  15mL MULTIHANCE GADOBENATE DIMEGLUMINE 529 MG/ML IV SOLN  COMPARISON:  None.  FINDINGS: Calvarium and upper cervical spine: No focal marrow signal abnormality.  Orbits: No significant findings.  Sinuses and Mastoids: Clear. Mastoid and middle ears are clear.  Brain: 2 small (~3 mm) DWI hyperintense foci on coronal imaging in the left frontal white matter which are suspicious for acute infarcts. These are not clearly localized on anatomic sequences. No evidence of major vessel occlusion or intracranial hemorrhage. Small T2 hyperintense defect present in the left caudate head which could reflect the patient's reported prior strokes.  Normal brain volume.  No hydrocephalus, hemorrhage, or mass lesion.  IMPRESSION: Two probable punctate left frontal white matter infarcts, uncertainty related to small size.   Electronically Signed   By: Marnee Spring M.D.   On: 01/03/2015 16:51    5:11 PM MRI indicated likely small left frontal white matter infarcts.  Discussed with patient, agrees to admission.  She is resting comfortably, denies needs at this time.  Case discussed with neurology, Dr. Roseanne Reno-- patient will require admission, will need to be transferred to [facility].  Patient will be admitted to hospitalist service and transferred to [facility] cone-- Dr. Waymon Amato.  Temp admit orders placed.  VS remain stable here.  Garlon Hatchet, PA-C 01/03/15 1908  Arby Barrette, MD 01/16/15 [phone]

## 2015-01-03 NOTE — ED Notes (Signed)
Pt back from MRI 

## 2015-01-03 NOTE — ED Notes (Addendum)
Lab delay - RN in there starting IV

## 2015-01-03 NOTE — ED Notes (Signed)
rn tried 2x with ultrasound, unsuccessful. Successful 22g IV in right hand. md made aware, a md will attempt ultrasound IV.

## 2015-01-03 NOTE — Progress Notes (Signed)
Patient arrived to 5MW14 AAOx3. Vitals taken, tele placed, call bell at bedside. Will continue to monitor. Karell Tukes, [place] Scrape, RN

## 2015-01-03 NOTE — ED Notes (Signed)
Pt in MRI.

## 2015-01-03 NOTE — Consult Note (Signed)
Admission H&P    Chief Complaint: New onset weakness involving left face arm and leg as well as numbness.  HPI: Tanya Mayer is an 34 y.o. female with a history of previous strokes, retention and diabetes mellitus, presenting with new onset left-sided weakness and numbness area to was last known well at 8:15 PM on 01/02/2015. Patient has been on aspirin and Plavix and a platelet therapy but has not been compliant with taking these medications. She also has not been on antihypertensive medication. Blood pressure in the ED was 140/117. CT scan of the head was unremarkable. Noncontrast MRI study showed 2 small right frontal subcortical ischemic infarctions.  LSN: 8:15 PM on 01/02/2015 tPA Given: No: Beyond time window for treatment consideration mRankin:  Past Medical History  Diagnosis Date  . Diabetes mellitus without complication   . Hypertension   . Stroke     hx of- left sided deficits    No past surgical history on file.  Family history: Positive for stroke and breast cancer involving her mother.  Social History:  has no tobacco, alcohol, and drug history on file.  Allergies:  Allergies  Allergen Reactions  . Carrot [Daucus Carota] Anaphylaxis  . Eggs Or Egg-Derived Products Anaphylaxis and Swelling  . Mushroom Extract Complex Anaphylaxis   Medications: Patient's preadmission medications were reviewed by me.  ROS: History obtained from the patient  General ROS: negative for - chills, fatigue, fever, night sweats, weight gain or weight loss Psychological ROS: negative for - behavioral disorder, hallucinations, memory difficulties, mood swings or suicidal ideation Ophthalmic ROS: negative for - blurry vision, double vision, eye pain or loss of vision ENT ROS: negative for - epistaxis, nasal discharge, oral lesions, sore throat, tinnitus or vertigo Allergy and Immunology ROS: negative for - hives or itchy/watery eyes Hematological and Lymphatic ROS: negative for -  bleeding problems, bruising or swollen lymph nodes Endocrine ROS: negative for - galactorrhea, hair pattern changes, polydipsia/polyuria or temperature intolerance Respiratory ROS: negative for - cough, hemoptysis, shortness of breath or wheezing Cardiovascular ROS: negative for - chest pain, dyspnea on exertion, edema or irregular heartbeat Gastrointestinal ROS: negative for - abdominal pain, diarrhea, hematemesis, nausea/vomiting or stool incontinence Genito-Urinary ROS: negative for - dysuria, hematuria, incontinence or urinary frequency/urgency Musculoskeletal ROS: negative for - joint swelling or muscular weakness Neurological ROS: as noted in HPI Dermatological ROS: negative for rash and skin lesion changes  Physical Examination: Blood pressure 148/105, pulse 69, temperature 98.1 F (36.7 C), temperature source Oral, resp. rate 23, last menstrual period 12/13/2014, SpO2 99 %.  HEENT-  Normocephalic, no lesions, without obvious abnormality.  Normal external eye and conjunctiva.  Normal TM's bilaterally.  Normal auditory canals and external ears. Normal external nose, mucus membranes and septum.  Normal pharynx. Neck supple with no masses, nodes, nodules or enlargement. Cardiovascular - regular rate and rhythm, S1, S2 normal, no murmur, click, rub or gallop Lungs - chest clear, no wheezing, rales, normal symmetric air entry Abdomen - soft, non-tender; bowel sounds normal; no masses,  no organomegaly Extremities - no edema, no skin discoloration and no clubbing  Neurologic Examination: Mental Status: Drowsy, oriented, in no acute distress.  Speech minimally slurred without evidence of aphasia. Able to follow commands without difficulty. Cranial Nerves: II-Visual fields were normal. III/IV/VI-Pupils were equal and reacted normally to light. Extraocular movements were full and conjugate.    V/VII-reduced perception of tactile sensation over left lower face; mild left lower facial  weakness. VIII-normal. X-mild dysarthria. XI: trapezius strength/neck flexion  strength normal bilaterally XII-midline tongue extension with normal strength. Motor: Poor effort long-term movements of left extremities; patient was able to hold the left upper extremity as well as left lower extremity against gravity but drifted. Strength of right extremities was normal. Sensory: Reduced perception of tactile sensation over right extremities compared to left Deep Tendon Reflexes: 1+ and symmetric. Plantars: Mute bilaterally Cerebellar: Normal finger-to-nose testing with use of right upper extremity; poor effort with left upper extremity. Carotid auscultation: Normal  Results for orders placed or performed during the hospital encounter of 01/03/15 (from the past 48 hour(s))  CBG monitoring, ED     Status: Abnormal   Collection Time: 01/03/15 11:42 AM  Result Value Ref Range   Glucose-Capillary 111 (H) 65 - 99 mg/dL   Comment 1 Notify RN    Comment 2 Document in Chart   Ethanol     Status: None   Collection Time: 01/03/15 12:45 PM  Result Value Ref Range   Alcohol, Ethyl (B) <5 <5 mg/dL    Comment:        LOWEST DETECTABLE LIMIT FOR SERUM ALCOHOL IS 5 mg/dL FOR MEDICAL PURPOSES ONLY   Protime-INR     Status: None   Collection Time: 01/03/15 12:48 PM  Result Value Ref Range   Prothrombin Time 12.2 11.6 - 15.2 seconds   INR 0.88 0.00 - 1.49  APTT     Status: None   Collection Time: 01/03/15 12:48 PM  Result Value Ref Range   aPTT 29 24 - 37 seconds  CBC     Status: None   Collection Time: 01/03/15 12:48 PM  Result Value Ref Range   WBC 5.5 4.0 - 10.5 K/uL   RBC 4.63 3.87 - 5.11 MIL/uL   Hemoglobin 13.5 12.0 - 15.0 g/dL   HCT 42.1 36.0 - 46.0 %   MCV 90.9 78.0 - 100.0 fL   MCH 29.2 26.0 - 34.0 pg   MCHC 32.1 30.0 - 36.0 g/dL   RDW 13.4 11.5 - 15.5 %   Platelets 180 150 - 400 K/uL  Differential     Status: None   Collection Time: 01/03/15 12:48 PM  Result Value Ref Range    Neutrophils Relative % 52 43 - 77 %   Neutro Abs 2.9 1.7 - 7.7 K/uL   Lymphocytes Relative 35 12 - 46 %   Lymphs Abs 1.9 0.7 - 4.0 K/uL   Monocytes Relative 8 3 - 12 %   Monocytes Absolute 0.5 0.1 - 1.0 K/uL   Eosinophils Relative 4 0 - 5 %   Eosinophils Absolute 0.2 0.0 - 0.7 K/uL   Basophils Relative 1 0 - 1 %   Basophils Absolute 0.0 0.0 - 0.1 K/uL  Comprehensive metabolic panel     Status: Abnormal   Collection Time: 01/03/15 12:48 PM  Result Value Ref Range   Sodium 141 135 - 145 mmol/L   Potassium 4.5 3.5 - 5.1 mmol/L   Chloride 108 101 - 111 mmol/L   CO2 26 22 - 32 mmol/L   Glucose, Bld 79 65 - 99 mg/dL   BUN 12 6 - 20 mg/dL   Creatinine, Ser 1.32 (H) 0.44 - 1.00 mg/dL   Calcium 9.5 8.9 - 10.3 mg/dL   Total Protein 7.8 6.5 - 8.1 g/dL   Albumin 4.1 3.5 - 5.0 g/dL   AST 28 15 - 41 U/L   ALT 17 14 - 54 U/L   Alkaline Phosphatase 105 38 - 126 U/L  Total Bilirubin 0.6 0.3 - 1.2 mg/dL   GFR calc non Af Amer 52 (L) >60 mL/min   GFR calc Af Amer >60 >60 mL/min    Comment: (NOTE) The eGFR has been calculated using the CKD EPI equation. This calculation has not been validated in all clinical situations. eGFR's persistently <60 mL/min signify possible Chronic Kidney Disease.    Anion gap 7 5 - 15  I-stat troponin, ED (not at [facility], [facility])     Status: None   Collection Time: 01/03/15 12:49 PM  Result Value Ref Range   Troponin i, poc 0.01 0.00 - 0.08 ng/mL   Comment 3            Comment: Due to the release kinetics of cTnI, a negative result within the first hours of the onset of symptoms does not rule out myocardial infarction with certainty. If myocardial infarction is still suspected, repeat the test at appropriate intervals.   I-Stat Chem 8, ED  (not at [facility], [facility])     Status: Abnormal   Collection Time: 01/03/15 12:51 PM  Result Value Ref Range   Sodium 142 135 - 145 mmol/L   Potassium 4.2 3.5 - 5.1 mmol/L   Chloride 105 101 - 111 mmol/L   BUN 12 6 - 20 mg/dL    Creatinine, Ser 1.20 (H) 0.44 - 1.00 mg/dL   Glucose, Bld 76 65 - 99 mg/dL   Calcium, Ion 1.24 (H) 1.12 - 1.23 mmol/L   TCO2 24 0 - 100 mmol/L   Hemoglobin 16.0 (H) 12.0 - 15.0 g/dL   HCT 47.0 (H) 36.0 - 46.0 %  Urine rapid drug screen (hosp performed)not at [facility]     Status: Abnormal   Collection Time: 01/03/15 12:51 PM  Result Value Ref Range   Opiates NONE DETECTED NONE DETECTED   Cocaine NONE DETECTED NONE DETECTED   Benzodiazepines POSITIVE (A) NONE DETECTED   Amphetamines NONE DETECTED NONE DETECTED   Tetrahydrocannabinol POSITIVE (A) NONE DETECTED   Barbiturates NONE DETECTED NONE DETECTED    Comment:        DRUG SCREEN FOR MEDICAL PURPOSES ONLY.  IF CONFIRMATION IS NEEDED FOR ANY PURPOSE, NOTIFY LAB WITHIN 5 DAYS.        LOWEST DETECTABLE LIMITS FOR URINE DRUG SCREEN Drug Class       Cutoff (ng/mL) Amphetamine      1000 Barbiturate      200 Benzodiazepine   712 Tricyclics       458 Opiates          300 Cocaine          300 THC              50   Urinalysis, Routine w reflex microscopic (not at [facility])     Status: Abnormal   Collection Time: 01/03/15 12:51 PM  Result Value Ref Range   Color, Urine YELLOW YELLOW   APPearance CLOUDY (A) CLEAR   Specific Gravity, Urine 1.012 1.005 - 1.030   pH 7.0 5.0 - 8.0   Glucose, UA NEGATIVE NEGATIVE mg/dL   Hgb urine dipstick NEGATIVE NEGATIVE   Bilirubin Urine NEGATIVE NEGATIVE   Ketones, ur NEGATIVE NEGATIVE mg/dL   Protein, ur 30 (A) NEGATIVE mg/dL   Urobilinogen, UA 0.2 0.0 - 1.0 mg/dL   Nitrite NEGATIVE NEGATIVE   Leukocytes, UA NEGATIVE NEGATIVE  I-Stat CG4 Lactic Acid, ED     Status: None   Collection Time: 01/03/15 12:51 PM  Result Value Ref Range  Lactic Acid, Venous 1.39 0.5 - 2.0 mmol/L  Urine microscopic-add on     Status: Abnormal   Collection Time: 01/03/15 12:51 PM  Result Value Ref Range   RBC / HPF 0-2 <3 RBC/hpf    Comment: 0-2   Bacteria, UA FEW (A) RARE    Comment: FEW  I-Stat Beta hCG blood, ED  ([facility], [facility], [facility] only)     Status: None   Collection Time: 01/03/15 12:56 PM  Result Value Ref Range   I-stat hCG, quantitative <5.0 <5 mIU/mL   Comment 3            Comment:   GEST. AGE      CONC.  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 There is no radio opaque or metallic valve demonstrated. No metallic structures are demonstrated within the thorax. There is a butane cigarette lighter overlying the left axillary and left breast region. The lungs are well-expanded and clear. The pulmonary vascularity is normal. The bony thorax is unremarkable.  IMPRESSION: No radiopaque prosthetic valve or other radiopaque implanted device is demonstrated. A butane cigarette lighter is visible overlying the soft tissues over the left aspect of the thorax.   Electronically Signed   By: David  Martinique M.D.   On: 01/03/2015 15:25   Ct Head Wo Contrast  01/03/2015   CLINICAL DATA:  Slurred speech at 7 a.m. today. Previous strokes with left-sided deficits.  EXAM: CT HEAD WITHOUT CONTRAST  TECHNIQUE: Contiguous axial images were obtained from the base of the skull through the vertex without intravenous contrast.  COMPARISON:  None.  FINDINGS: Normal appearing cerebral hemispheres and posterior fossa structures. Normal size and position of the ventricles. No intracranial hemorrhage, mass lesion or CT evidence of acute infarction. Unremarkable bones and included paranasal sinuses.  IMPRESSION: Normal  examination.   Electronically Signed   By: Claudie Revering M.D.   On: 01/03/2015 13:50   Mr Jeri Cos UD Contrast  01/03/2015   CLINICAL DATA:  Headache and history of CVA.  EXAM: MRI HEAD WITHOUT AND WITH CONTRAST  TECHNIQUE: Multiplanar, multiecho pulse sequences of the brain and surrounding structures were obtained without and with intravenous contrast.  CONTRAST:  66m MULTIHANCE GADOBENATE DIMEGLUMINE 529 MG/ML IV SOLN  COMPARISON:  None.  FINDINGS: Calvarium and upper cervical spine: No focal marrow signal abnormality.  Orbits: No significant findings.  Sinuses and Mastoids: Clear. Mastoid and middle ears are clear.  Brain: 2 small (~3 mm) DWI hyperintense foci on coronal imaging in the left frontal white matter which are suspicious for acute infarcts. These are not clearly localized on anatomic sequences.  No evidence of major vessel occlusion or intracranial hemorrhage. Small T2 hyperintense defect present in the left caudate head which could reflect the patient's reported prior strokes.  Normal brain volume.  No hydrocephalus, hemorrhage, or mass lesion.  IMPRESSION: Two probable punctate left frontal white matter infarcts, uncertainty related to small size.   Electronically Signed   By: Monte Fantasia M.D.   On: 01/03/2015 16:51    Assessment: 34 y.o. female with multiple risk factors for stroke presenting with new onset left-sided weakness and numbness as well as small areas of acute ischemic infarction involving subcortical right frontal lobe. Patient's symptoms appear to be disproportionate to severity to MRI finding.  Stroke Risk Factors - diabetes mellitus, family history and hypertension  Plan: 1. HgbA1c, fasting lipid panel 2. MRA  of the brain without contrast 3. PT consult, OT consult, Speech consult 4. Echocardiogram 5. Carotid dopplers 6. Prophylactic therapy-resume aspirin and Plavix 7. Risk factor modification 8. Hypercoagulation panel  C.R. Nicole Kindred, MD [facility] [phone]  01/03/2015, 5:39 PM

## 2015-01-03 NOTE — ED Notes (Signed)
Pt to MRI

## 2015-01-04 ENCOUNTER — Ambulatory Visit (HOSPITAL_COMMUNITY): Payer: Medicaid Other

## 2015-01-04 ENCOUNTER — Encounter (HOSPITAL_COMMUNITY): Payer: BLUE CROSS/BLUE SHIELD

## 2015-01-04 ENCOUNTER — Inpatient Hospital Stay (HOSPITAL_COMMUNITY): Payer: Medicaid Other

## 2015-01-04 DIAGNOSIS — I69854 Hemiplegia and hemiparesis following other cerebrovascular disease affecting left non-dominant side: Secondary | ICD-10-CM

## 2015-01-04 LAB — GLUCOSE, CAPILLARY
GLUCOSE-CAPILLARY: 92 mg/dL (ref 65–99)
Glucose-Capillary: 89 mg/dL (ref 65–99)
Glucose-Capillary: 85 mg/dL (ref 65–99)

## 2015-01-04 LAB — LIPID PANEL
CHOLESTEROL: 155 mg/dL (ref 0–200)
HDL: 36 mg/dL — ABNORMAL LOW (ref >40)
LDL Cholesterol: 73 mg/dL (ref 0–99)
Total CHOL/HDL Ratio: 4.3 RATIO
Triglycerides: 232 mg/dL — ABNORMAL HIGH (ref <150)
VLDL: 46 mg/dL — AB (ref 0–40)

## 2015-01-04 MED ORDER — ASPIRIN 325 MG PO TABS
325.0000 mg | ORAL_TABLET | Freq: Every day | ORAL | Status: DC
  Administered 2015-01-04: 325 mg via ORAL
  Filled 2015-01-04: qty 1

## 2015-01-04 NOTE — Discharge Summary (Signed)
Physician Discharge Summary  Tanya Mayer ZOX:[id number] DOB: Feb 08, 1981 DOA: 01/03/2015  PCP: No primary care provider on file.  Admit date: 01/03/2015 Discharge date: 01/04/2015  Time spent: 30 minutes  Recommendations for Outpatient Follow-up:  Patient left AMA. CM did arrange follow up appointment for the patient on 01/10/2015.  Discharge Diagnoses:  Possible acute left brain stroke versus migraine A central hypertension Diabetes mellitus, type II next on hyperlipidemia Tobacco abuse next line marijuana abuse Asthma History of recurrent DVT Stage II chronic kidney disease Medical noncompliance History of anxiety and depression  Discharge Condition: Left AMA  Diet recommendation: None, Left AMA  There were no vitals filed for this visit.  History of present illness:  On 01/03/2015 by Dr. Marcellus Scott Tanya Mayer is a 34 y.o. female , married, homeless and lived in [place] until a week ago when she moved to [facility] in [place], history of recurrent CVA and residual left hemiparesis, ambulates with the help of a walker, DM 2/IDDM, HTN, HLD, Asthma, recurrent DVT- timing of the last episode not clear,? 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 Patient is somnolent due to medications she received prior to MRI and for headache but is easily arousable and provided history. No family at bedside. Last seen well at 8:15 PM on 01/02/15. This morning her spouse noticed slurred speech and worsening left-sided weakness. Blood pressure was apparently checked at residence and was told to be high. She denies headache currently or visual symptoms. No history of chest pain, palpitations, dyspnea, asymmetric leg swelling or pain. Rectal bleeding documented under EDP notes which patient denies. She does however give history of  hemorrhoids. She states that she has not taken any of her medications for greater than 1 month. In the ED, noted high blood pressure, lab work significant for creatinine of 1.3 to, CT head normal, MRI brain shows 2 probable punctate left frontal white matter infarcts. Neurology has consulted and recommend hospitalist admission and transfer to [facility] for further evaluation and management.  Hospital Course:  Possible acute left brain stroke vs Migraine -Patient's symptoms appear to be disproportionate to severity of MRI findings -Spoke with Dr. Pearlean Brownie, unlikely CVA, possibly migraine -Was supposed to be on aspirin and Plavix but has not taken in greater than one month -[facility] in [place]: Patient had presented for strokes 10/31/2014. She had a 5 day stay. She was noted to have left-sided weakness at that time. Psychiatry was consulted for anxiety depression. Patient was discharged with a diagnosis of conversion disorder with left-sided weakness. Patient did attend rehabilitation.   -Obtained signed consent from patient for medical release. Still pending records from [facility]. -Not obtain echocardiogram, carotid Doppler is able to conduct her 3 months ago at a different hospital. This record are still pending. -PT, OT, speech therapy did assess patient, no further follow-up needed -Patient was placed on aspirin as well as Plavix during her hospital stay -LDL 73, hemoglobin A1c pending  Essential hypertension -Uncontrolled. Allowed for permissive hypertension due to possible acute stroke.   Type II DM with vascular complications -Patient stated that she was on Levemir 10 units twice a day but has been noncompliant. Blood sugars look reasonable.  -Was placed on insulin sliding scale CBG monitoring -Hemoglobin A1c pending  Hyperlipidemia -Lipid panel: Total cholesterol 155, triglycerides 232, HDL 36, LDL 73  Tobacco  abuse -Cessation counseled. Patient declined nicotine  patch  Marijuana abuse -Cessation counseled  Asthma -Stable  History of recurrent DVT -Last episode unknown. Only on aspirin and Plavix in the recent past.  -Pending medical records.  -Placed on Lovenox for DVT prophylaxis  Stage II chronic kidney disease or AKI -Creatinine slightly improved, 1.2  Medical noncompliance  Mental health-? History of anxiety, depression -Has not been on medications for greater than a month -As noted above, patient was hospitalized and discharged with a diagnosis of conversion disorder. During her hospital stay at [facility] in June 2016, psychiatry was consulted for anxiety and depression. Patient does take multiple medications at home.  Procedures: None  Consultations: Neurology  Discharge Exam: Filed Vitals:   01/04/15 0936  BP: 145/103  Pulse: 52  Temp: 97.8 F (36.6 C)  Resp: 16     General: Well developed, well nourished, No distress  HEENT: NCAT,  mucous membranes moist.  Cardiovascular: S1 S2 auscultated, no rubs, murmurs or gallops. Regular rate and rhythm.  Respiratory: Clear to auscultation bilaterally   Abdomen: Soft, nontender, nondistended, + bowel sounds  Extremities: warm dry without cyanosis clubbing or edema.  Neuro: AAOx3, Strength decreased on left (supposedly baseline)  Discharge Instructions     Medication List    ASK your doctor about these medications        acetaminophen 500 MG tablet  Commonly known as:  TYLENOL  Take 500 mg by mouth every 6 (six) hours as needed for moderate pain.     ALPRAZolam 1 MG tablet  Commonly known as:  XANAX  Take 1 mg by mouth 2 (two) times daily as needed for anxiety.     aspirin 81 MG tablet  Take 81 mg by mouth daily.     clopidogrel 75 MG tablet  Commonly known as:  PLAVIX  Take 75 mg by mouth daily.     gabapentin 300 MG capsule  Commonly known as:  NEURONTIN  Take 900 mg by mouth 3 (three) times daily.     hydrOXYzine 50 MG capsule  Commonly known as:   VISTARIL  Take 50 mg by mouth at bedtime.     polyethylene glycol packet  Commonly known as:  MIRALAX / GLYCOLAX  Take 17 g by mouth daily.     QUEtiapine 300 MG tablet  Commonly known as:  SEROQUEL  Take 300 mg by mouth at bedtime.     risperiDONE 0.25 MG tablet  Commonly known as:  RISPERDAL  Take 0.25 mg by mouth 2 (two) times daily.     trazodone 300 MG tablet  Commonly known as:  DESYREL  Take 300 mg by mouth at bedtime.       Allergies  Allergen Reactions  . Carrot [Daucus Carota] Anaphylaxis  . Eggs Or Egg-Derived Products Anaphylaxis and Swelling  . Mushroom Extract Complex Anaphylaxis       Follow-up Information    Follow up with [facility] On 01/10/2015.   Specialty:  Internal Medicine   Why:  Hospital follow-up appointment on 01/10/15 at 8:30 am with Concepcion Living, NP to establish care.   Contact information:   [address] [place] [address] [phone]       The results of significant diagnostics from this hospitalization (including imaging, microbiology, ancillary and laboratory) are listed below for reference.    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 There is no radio opaque or metallic valve demonstrated. No metallic structures are demonstrated within the thorax. There is a butane cigarette lighter overlying the left axillary and left breast region. The lungs are well-expanded and clear. The pulmonary vascularity is normal. The bony thorax is unremarkable.  IMPRESSION: No radiopaque prosthetic valve or other radiopaque implanted device is demonstrated. A butane cigarette lighter is visible overlying the soft tissues over the left aspect of the thorax.   Electronically Signed   By: David  [place]and M.D.    On: 01/03/2015 15:25   Ct Head Wo Contrast  01/04/2015   CLINICAL DATA:  New acute onset left-sided weakness. Diabetes. Yesterday an MRI showed 2 tiny lesions in the left frontal white matter suspicious for lacunar infarcts. Hypertension.  EXAM: CT HEAD WITHOUT CONTRAST  TECHNIQUE: Contiguous axial images were obtained from the base of the skull through the vertex without intravenous contrast.  COMPARISON:  01/03/2015  FINDINGS: The brainstem, cerebellum, cerebral peduncles, thalamus, basal ganglia, basilar cisterns, and ventricular system appear within normal limits. No intracranial hemorrhage, mass lesion, or acute CVA.  IMPRESSION: 1.  No significant abnormality identified.   Electronically Signed   By: Gaylyn Rong M.D.   On: 01/04/2015 08:27   Ct Head Wo Contrast  01/03/2015   CLINICAL DATA:  Slurred speech at 7 a.m. today. Previous strokes with left-sided deficits.  EXAM: CT HEAD WITHOUT CONTRAST  TECHNIQUE: Contiguous axial images were obtained from the base of the skull through the vertex without intravenous contrast.  COMPARISON:  None.  FINDINGS: Normal appearing cerebral hemispheres and posterior fossa structures. Normal size and position of the ventricles. No intracranial hemorrhage, mass lesion or CT evidence of acute infarction. Unremarkable bones and included paranasal sinuses.  IMPRESSION: Normal examination.   Electronically Signed   By: Beckie Salts M.D.   On: 01/03/2015 13:50   Mr Laqueta Jean ZO Contrast  01/03/2015   CLINICAL DATA:  Headache and history of CVA.  EXAM: MRI HEAD WITHOUT AND WITH CONTRAST  TECHNIQUE: Multiplanar, multiecho pulse sequences of the brain and surrounding structures were obtained without and with intravenous contrast.  CONTRAST:  15mL MULTIHANCE GADOBENATE DIMEGLUMINE 529 MG/ML IV SOLN  COMPARISON:  None.  FINDINGS: Calvarium and upper cervical spine: No focal marrow signal abnormality.  Orbits: No significant findings.  Sinuses and Mastoids: Clear. Mastoid  and middle ears are clear.  Brain: 2 small (~3 mm) DWI hyperintense foci on coronal imaging in the left frontal white matter which are suspicious for acute infarcts. These are not clearly localized on anatomic sequences. No evidence of major vessel occlusion or intracranial hemorrhage. Small T2 hyperintense defect present in the left caudate head which could reflect the patient's reported prior strokes.  Normal brain volume.  No hydrocephalus, hemorrhage, or mass lesion.  IMPRESSION: Two probable punctate left frontal white matter infarcts, uncertainty related to small size.   Electronically Signed   By: Marnee Spring M.D.   On: 01/03/2015 16:51   Mr Maxine Glenn Head/brain Wo Cm  01/04/2015   CLINICAL DATA:  34 year old female with new onset left side weakness with questionable left frontal lacunar infarcts on MRI. Diabetes and hypertension. Initial encounter.  EXAM: MRA HEAD WITHOUT CONTRAST  TECHNIQUE: Angiographic images of the Circle of Willis were obtained using MRA technique without intravenous contrast.  COMPARISON:  Head CT  without contrast 01/04/2015. Brain MRI 01/03/2015, and earlier  FINDINGS: Study is mildly degraded by motion artifact despite repeated imaging attempts.  Antegrade flow in the posterior circulation. Mildly dominant distal left vertebral artery. Normal left PICA origin and vertebrobasilar junction. Dominant right AICA. No basilar artery stenosis. SCA and PCA origins are within normal limits. Posterior communicating arteries are diminutive or absent. Bilateral PCA branches are within normal limits.  Antegrade flow in both ICA siphons. No siphon stenosis. Ophthalmic artery origins are within normal limits. Carotid termini are patent. The right ACA A1 segment is diminutive or absent. The anterior communicating artery, left A1, and visualized bilateral ACA branches are within normal limits. Bilateral MCA M1 segments and bifurcations are normal. Visualized bilateral MCA branches are within normal  limits.  IMPRESSION: Negative intracranial MRA.   Electronically Signed   By: Odessa Fleming M.D.   On: 01/04/2015 10:04    Microbiology: No results found for this or any previous visit (from the past 240 hour(s)).   Labs: Basic Metabolic Panel:  Recent Labs Lab 01/03/15 1248 01/03/15 1251  NA 141 142  K 4.5 4.2  CL 108 105  CO2 26  --   GLUCOSE 79 76  BUN 12 12  CREATININE 1.32* 1.20*  CALCIUM 9.5  --    Liver Function Tests:  Recent Labs Lab 01/03/15 1248  AST 28  ALT 17  ALKPHOS 105  BILITOT 0.6  PROT 7.8  ALBUMIN 4.1   No results for input(s): LIPASE, AMYLASE in the last 168 hours. No results for input(s): AMMONIA in the last 168 hours. CBC:  Recent Labs Lab 01/03/15 1248 01/03/15 1251  WBC 5.5  --   NEUTROABS 2.9  --   HGB 13.5 16.0*  HCT 42.1 47.0*  MCV 90.9  --   PLT 180  --    Cardiac Enzymes: No results for input(s): CKTOTAL, CKMB, CKMBINDEX, TROPONINI in the last 168 hours. BNP: BNP (last 3 results) No results for input(s): BNP in the last 8760 hours.  ProBNP (last 3 results) No results for input(s): PROBNP in the last 8760 hours.  CBG:  Recent Labs Lab 01/03/15 2048 01/03/15 2347 01/04/15 0407 01/04/15 0741 01/04/15 1138  GLUCAP 87 83 89 85 92       Signed:  Dacota Devall  [facility] 01/04/2015, 12:51 PM

## 2015-01-04 NOTE — Progress Notes (Signed)
STROKE TEAM PROGRESS NOTE  HPI Tanya Mayer is an 34 y.o. female with a history of previous strokes, retention and diabetes mellitus, presenting with new onset left-sided weakness and numbness area to was last known well at 8:15 PM on 01/02/2015. Patient has been on aspirin and Plavix and a platelet therapy but has not been compliant with taking these medications. She also has not been on antihypertensive medication. Blood pressure in the ED was 140/117. CT scan of the head was unremarkable. Noncontrast MRI study showed 2 small right frontal subcortical ischemic infarctions.  LSN: 8:15 PM on 01/02/2015 tPA Given: No: Beyond time window for treatment consideration mRankin:    SUBJECTIVE (INTERVAL HISTORY) No family members present. The patient states she has a history of migraine headaches for which she takes Percocet. She also reports that she had a previous stroke while in [place] 3 months ago. She reports continued left-sided weakness from her previous stroke.   OBJECTIVE Temp:  [97 F (36.1 C)-98.4 F (36.9 C)] 97.6 F (36.4 C) (08/26 0602) Pulse Rate:  [51-83] 51 (08/26 0602) Cardiac Rhythm:  [-] Sinus bradycardia (08/25 2203) Resp:  [14-23] 14 (08/26 0602) BP: (127-166)/(84-117) 144/102 mmHg (08/26 0602) SpO2:  [96 %-100 %] 100 % (08/26 0602) Weight:  [74.844 kg (165 lb)] 74.844 kg (165 lb) (08/25 1539)     Component Value Date/Time   CHOL 155 01/04/2015 0450   TRIG 232* 01/04/2015 0450   HDL 36* 01/04/2015 0450   CHOLHDL 4.3 01/04/2015 0450   VLDL 46* 01/04/2015 0450   LDLCALC 73 01/04/2015 0450   No results found for: HGBA1C    Component Value Date/Time   LABOPIA NONE DETECTED 01/03/2015 1251   COCAINSCRNUR NONE DETECTED 01/03/2015 1251   LABBENZ POSITIVE* 01/03/2015 1251   AMPHETMU NONE DETECTED 01/03/2015 1251   THCU POSITIVE* 01/03/2015 1251   LABBARB NONE DETECTED 01/03/2015 1251      IMAGING  Dg Chest 2 View 01/03/2015    No radiopaque prosthetic  valve or other radiopaque implanted device is demonstrated. A butane cigarette lighter is visible overlying the soft tissues over the left aspect of the thorax.      Ct Head Wo Contrast 01/04/2015    1.  No significant abnormality identified.      Ct Head Wo Contrast 01/03/2015    Normal examination.     Mr Laqueta Jean Wo Contrast 01/03/2015    Two probable punctate left frontal white matter infarcts, uncertainty related to small size.    Negative intracranial MRA.   PHYSICAL EXAM   Pleasant age dlady not in distress.. Afebrile. Head is nontraumatic. Neck is supple without bruit.    Cardiac exam no murmur or gallop. Lungs are clear to auscultation. Distal pulses are well felt.  Neurological Exam ;  Awake  Alert oriented x 3. Normal speech and language.eye movements full without nystagmus.fundi were not visualized. Vision acuity and fields appear normal. Hearing is normal. Palatal movements are normal. Face symmetric. Tongue midline. Normal strength, tone, reflexes and coordination. Normal sensation. Gait deferred.    ASSESSMENT/PLAN [name] is a 34 y.o. female with history of migraine headaches, a previous stroke, diabetes mellitus, and hypertension presenting with left sided weakness and numbness. She did not receive IV t-PA due to late presentation.  TIA:  Non-dominant   Resultant residual left hemiparesis  MRI  Two probable punctate left frontal white matter infarcts, uncertainty related to small size.   MRA  negative  Carotid Doppler not performed  2D Echo not performed  LDL 73  HgbA1c pending  Lovenox for VTE prophylaxis  Diet NPO time specified  aspirin 81 mg orally every day and clopidogrel 75 mg orally every day prior to admission, now on aspirin 81 mg orally every day and clopidogrel 75 mg orally every day  Patient counseled to be compliant with her antithrombotic medications  Ongoing aggressive stroke risk factor management  Therapy  recommendations:  No follow-up physical therapy recommended  Disposition: Discharged  Hypertension  Elevated diastolic blood pressures   Hyperlipidemia  Home meds:  No lipid lowering medications prior to admission  LDL 73, goal < 70   Diabetes  HgbA1c pending, goal < 7.0  Controlled  Other Stroke Risk Factors  Cigarette smoker, advised to stop smoking  Hx stroke/TIA  Migraines     Other Active Problems  Drug screen positive for benzodiazepine and THC  Creatinine 1.32  Other Pertinent History  History of recurrent DVT  Chronic kidney disease  History of anxiety and depression  The patient is followed at the [facility]  Anaphylaxis with carrots, eggs, and mushrooms  Hospital day # 1  Delton See PA-C [facility] Pager [phone] 01/04/2015, 5:27 PM I have personally examined this patient, reviewed notes, independently viewed imaging studies, participated in medical decision making and plan of care. I have made any additions or clarifications directly to the above note. Agree with note above.she presented with transient left body paresthesias and headache likely TIA versus complicated migraine episode.She is at risk for neurological worsening, recurrent TIA,stroke and needs ongoing stroke evaluation and risk factor modification. Delia Heady, MD Medical Director [facility] Pager: [phone] 01/04/2015 6:49 PM     To contact Stroke Continuity provider, please refer to [email or web address]. After hours, contact General Neurology

## 2015-01-04 NOTE — Care Management Note (Signed)
Case Management Note  Patient Details  Name: Lavenia Stumpo MRN: [id number] Date of Birth: 24-Oct-1980  Subjective/Objective:                    Action/Plan: Met with patient to verify demographics and discuss obtaining a PCP.  Patient has recently moved from [place] with her husband and is currently residing at the [facility].  She states that she DOES carry [employer] at that time.  Consult placed to Financial Counselor to assist with verification. Patient was provided with the [facility] number and encouraged to make the call today to have a PCP selected prior to discharge.  CSW is aware that patient is from [facility]. CM will continue to follow for additional discharge needs.  Expected Discharge Date:                  Expected Discharge Plan:  Homeless Shelter  In-House Referral:  Development worker, community, PCP / [facility]  Discharge planning Services  CM Consult  Post Acute Care Choice:    Choice offered to:     DME Arranged:    DME Agency:     HH Arranged:    HH Agency:     Status of Service:  In process, will continue to follow  Medicare Important Message Given:    Date Medicare IM Given:    Medicare IM give by:    Date Additional Medicare IM Given:    Additional Medicare Important Message give by:     If discussed at [place] of Stay Meetings, dates discussed:    Additional Comments:  Rolm Baptise, RN 01/04/2015, 10:09 AM

## 2015-01-04 NOTE — Evaluation (Addendum)
Physical Therapy Evaluation Patient Details Name: Tanya Mayer MRN: [id number] DOB: 09-21-1980 Today's Date: 01/04/2015   History of Present Illness  34 y.o. 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 Imaging showed L frontal infact.   Clinical Impression  Pt reports she's at baseline of independence with a walker for mobility. She walked 160' with RW, no loss of balance. Decreased sensation to light touch and weakness LLE are at baseline. She is borrowing a Occupational hygienist from [employer], rollator recommended (although pt reports her insurance covered a RW for her recently).  No further PT indicated, PT signing off.     Follow Up Recommendations No PT follow up    Equipment Recommendations  Other (comment) Aeronautical engineer (pt is borrowing one from [employer]))    Recommendations for Other Services OT consult     Precautions / Restrictions Precautions Precautions: Fall Precaution Comments: pt denies falls in past 3 months Restrictions Weight Bearing Restrictions: No      Mobility  Bed Mobility Overal bed mobility: Independent                Transfers Overall transfer level: Modified independent Equipment used: Rolling walker (2 wheeled)                Ambulation/Gait Ambulation/Gait assistance: Modified independent (Device/Increase time) Ambulation Distance (Feet): 160 Feet Assistive device: Rolling walker (2 wheeled) Gait Pattern/deviations: Step-through pattern   Gait velocity interpretation: Below normal speed for age/gender (at baseline) General Gait Details: steady with RW, no LOB  Stairs            Wheelchair Mobility    Modified Rankin (Stroke Patients Only)       Balance                                             Pertinent Vitals/Pain Pain Assessment: No/denies pain    Home Living Family/patient expects to be discharged to::  Shelter/Homeless [employer])                      Prior Function Level of Independence: Independent with assistive device(s)         Comments: used a Probation officer at [employer], has RW, Independent with bathing/dressing, uses elevator at [employer]   Dominant Hand: Left    Extremity/Trunk Assessment   Upper Extremity Assessment: Defer to OT evaluation           Lower Extremity Assessment: LLE deficits/detail   LLE Deficits / Details: ankle DF 1/5, knee extension -3/5, pt reports she's at baseline with strength and sensation  Cervical / Trunk Assessment: Normal  Communication   Communication: No difficulties  Cognition Arousal/Alertness: Awake/alert Behavior During Therapy: WFL for tasks assessed/performed Overall Cognitive Status: Within Functional Limits for tasks assessed                      General Comments      Exercises        Assessment/Plan    PT Assessment Patent does not need any further PT services  PT Diagnosis     PT Problem List    PT Treatment Interventions     PT Goals (Current goals can be found in the Care Plan section)  Acute Rehab PT Goals Patient Stated Goal: to eat, pt is NPO right now PT Goal Formulation: All assessment and education complete, DC therapy    Frequency     Barriers to discharge        Co-evaluation               End of Session Equipment Utilized During Treatment: Gait belt Activity Tolerance: Patient tolerated treatment well Patient left: in chair;with call bell/phone within reach;with nursing/sitter in room Nurse Communication: Mobility status         Time: 1610-9604 PT Time Calculation (min) (ACUTE ONLY): 16 min   Charges:   PT Evaluation $Initial PT Evaluation Tier I: 1 Procedure     PT G CodesTamala Ser 01/04/2015, 10:27 AM [phone]

## 2015-01-04 NOTE — Progress Notes (Signed)
Patient's neuro assessment changed, NIHHS increased from 8 to 16. Patient not alert, awakens to sternal rubs, left side flaccid, patient unable to move left arm or leg, no movement to painful stimulus, patient with slurred speech, complaining of "severe migraine headache." Neurology MD aware, stat ct head order placed, patient taken to CT with RN by bed. Patient returned to room at 0800, [facility] at 6. Will continue to monitor.

## 2015-01-04 NOTE — Evaluation (Signed)
Clinical/Bedside Swallow Evaluation Patient Details  Name: Tanya Mayer MRN: [id number] Date of Birth: Nov 10, 1980  Today's Date: 01/04/2015 Time: SLP Start Time (ACUTE ONLY): 1025 SLP Stop Time (ACUTE ONLY): 1040 SLP Time Calculation (min) (ACUTE ONLY): 15 min  Past Medical History:  Past Medical History  Diagnosis Date  . Diabetes mellitus without complication   . Hypertension   . Stroke     hx of- left sided deficits   Past Surgical History:  Past Surgical History  Procedure Laterality Date  . 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 PMH: history of recurrent CVA and residual left hemiparesis, ambulates with the help of a walker, DM 2/IDDM, HTN, HLD, Asthma, recurrent DVT.  Assessment / Plan / Recommendation Clinical Impression  Patient presents with functional swallow with all tested consistencies(puree, hard solids, thin liquids via cup and straw). No delays or difficulty with mastication, bolus formation, swallow initiation and no overt s/s aspiration as per this bedside swallow evaluation.    Aspiration Risk  None    Diet Recommendation Age appropriate regular solids;Thin   Medication Administration: Whole meds with liquid    Other  Recommendations Oral Care Recommendations: Patient independent with oral care   Follow Up Recommendations    N/A   Frequency and Duration   N/A     Pertinent Vitals/Pain     SLP Swallow Goals   N/A   Swallow Study Prior Functional Status       General Date of Onset: 01/03/15 Type of Study: Bedside swallow evaluation Previous Swallow Assessment: N/A Diet Prior to this Study: NPO Temperature Spikes Noted: No Respiratory Status: Room air History of Recent Intubation: No Behavior/Cognition: Alert;Cooperative;Pleasant mood Oral Cavity - Dentition: Adequate natural dentition/normal for age Self-Feeding Abilities: Able to feed  self Baseline Vocal Quality: Normal Volitional Cough: Strong Volitional Swallow: Able to elicit    Oral/Motor/Sensory Function Overall Oral Motor/Sensory Function: Appears within functional limits for tasks assessed   Ice Chips Ice chips: Not tested   Thin Liquid Thin Liquid: Within functional limits Presentation: Cup;Self Fed;Straw    Nectar Thick Nectar Thick Liquid: Not tested   Honey Thick Honey Thick Liquid: Not tested   Puree Puree: Within functional limits Presentation: Self Fed;Spoon   Solid   GO    Solid: Within functional limits Presentation: Self Fed Other Comments: mastication, bolus formation and swallow initiation all WNL with graham cracker       Angela Nevin, MA, CCC-SLP Speech-Language Pathologist

## 2015-01-04 NOTE — Progress Notes (Signed)
Patient stated she did not want to be at hospital any longer, asked to leave AMA. MD notified prior to patient leaving. Patient's neuro assessment unchanged, left side slightly weaker than right, patient states this is her baseline. Patient able to walk steady with no equipment. Patient states headache is resolved. Tele removed, IV removed. Patient stated she would be going to get bus to get ride home. Patient given follow up appointment information on 01/10/15. Patient signed AMA paperwork, in chart.

## 2015-01-04 NOTE — Care Management (Signed)
This Case Manager received phone call from Elmer Bales, RN CM regarding patient. Per Toni Amend, patient just moved to [place] from [place] and does not have a PCP.  Hospital follow-up appointment made on 01/10/15 at 0830 with Concepcion Living, NP at [facility] to establish care.  Appointment placed on AVS.  Elmer Bales, RN CM updated.

## 2015-01-04 NOTE — Evaluation (Signed)
Occupational Therapy Evaluation Patient Details Name: Tanya Mayer MRN: [id number] DOB: 1981/01/12 Today's Date: 01/04/2015    History of Present Illness 34 y.o. 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 Imaging showed L frontal infact.    Clinical Impression   Pt admitted with above and reports she has returned to baseline of independent with walker.  Dominant LUE weakness with decreased ROM and strength, however able to complete self-care tasks.  Handwriting back to baseline with name and birth date legible.  No equipment recommended, no further OT needs.  Will d/c.     Follow Up Recommendations  No OT follow up    Equipment Recommendations  None recommended by OT    Recommendations for Other Services       Precautions / Restrictions Precautions Precautions: Fall Precaution Comments: pt denies falls in past 3 months Restrictions Weight Bearing Restrictions: No      Mobility Bed Mobility Overal bed mobility: Independent                Transfers Overall transfer level: Modified independent Equipment used: Rolling walker (2 wheeled)             General transfer comment: Ambulated to bathroom to complete toilet transfer and then return to bed.  Pt Mod I with RW, noted some instances of picking up RW to maneuver obstacles reporting typically uses Rollator with swivel wheels.         ADL Overall ADL's : Modified independent;At baseline                                       General ADL Comments: Pt reports able to complete self-care tasks without assistance, able to fasten buttons and zippers and tie shoes.  Has handicap accessible shower with grab bars at current residence [facility]       Vision Vision Assessment?: No apparent visual deficits   Perception Perception Comments: WFL   Praxis Praxis Praxis tested?: Within functional limits     Pertinent Vitals/Pain Pain Assessment: No/denies pain     Hand Dominance Left   Extremity/Trunk Assessment Upper Extremity Assessment Upper Extremity Assessment: LUE deficits/detail LUE Deficits / Details: shoulder flexion approx 60 degrees, elbow and wrist WFL, loose gross grasp, able to complete thumb opposition and touch nose.  Pt reports no difficulty with buttons or tying.  Handwriting back to baseline and reports premorbid shoulder limitations LUE Coordination: decreased fine motor;decreased gross motor   Lower Extremity Assessment Lower Extremity Assessment: LLE deficits/detail LLE Deficits / Details: ankle DF 1/5, knee extension -3/5, pt reports she's at baseline with strength and sensation LLE Sensation: decreased light touch   Cervical / Trunk Assessment Cervical / Trunk Assessment: Normal   Communication Communication Communication: No difficulties   Cognition Arousal/Alertness: Awake/alert Behavior During Therapy: WFL for tasks assessed/performed Overall Cognitive Status: Within Functional Limits for tasks assessed                                Home Living Family/patient expects to be discharged to:: Shelter/Homeless (currently staying at [facility])  Prior Functioning/Environment Level of Independence: Independent with assistive device(s)        Comments: used a borrowed Occupational hygienist at [employer], has RW, Independent with bathing/dressing, uses Engineer, structural at [employer].  Reports shower is a handicap shower with grab bars.    OT Diagnosis: Generalized weakness;Hemiplegia dominant side   OT Problem List: Decreased strength;Decreased range of motion;Decreased coordination;Impaired UE functional use   OT Treatment/Interventions:      OT Goals(Current goals can be found in the care plan section) Acute Rehab OT Goals Patient Stated Goal: to eat, pt just placed on diet, and to go home OT Goal  Formulation: All assessment and education complete, DC therapy Time For Goal Achievement: 01/11/15 Potential to Achieve Goals: Good   End of Session Equipment Utilized During Treatment: Rolling walker Nurse Communication: Mobility status  Activity Tolerance: Patient tolerated treatment well;No increased pain Patient left: in bed;with nursing/sitter in room   Time: 1045-1055 OT Time Calculation (min): 10 min Charges:  OT General Charges $OT Visit: 1 Procedure OT Evaluation $Initial OT Evaluation Tier I: 1 Procedure G-CodesRosalio Loud 01/13/15, 11:09 AM

## 2015-01-05 LAB — HEMOGLOBIN A1C
HEMOGLOBIN A1C: 5.7 % — AB (ref 4.8–5.6)
MEAN PLASMA GLUCOSE: 117 mg/dL

## 2015-01-10 ENCOUNTER — Ambulatory Visit: Payer: BLUE CROSS/BLUE SHIELD | Admitting: Family Medicine

## 2015-01-18 ENCOUNTER — Emergency Department (HOSPITAL_COMMUNITY)
Admission: EM | Admit: 2015-01-18 | Discharge: 2015-01-18 | Disposition: A | Discharge status: Home / Self Care | Payer: Medicaid Other | Attending: Emergency Medicine | Admitting: Emergency Medicine

## 2015-01-18 ENCOUNTER — Encounter (HOSPITAL_COMMUNITY): Payer: Self-pay | Admitting: Emergency Medicine

## 2015-01-18 DIAGNOSIS — Z7982 Long term (current) use of aspirin: Secondary | ICD-10-CM | POA: Insufficient documentation

## 2015-01-18 DIAGNOSIS — Z79899 Other long term (current) drug therapy: Secondary | ICD-10-CM | POA: Insufficient documentation

## 2015-01-18 DIAGNOSIS — I10 Essential (primary) hypertension: Secondary | ICD-10-CM | POA: Insufficient documentation

## 2015-01-18 DIAGNOSIS — R1084 Generalized abdominal pain: Secondary | ICD-10-CM | POA: Diagnosis not present

## 2015-01-18 DIAGNOSIS — Z720 Tobacco use: Secondary | ICD-10-CM | POA: Diagnosis not present

## 2015-01-18 DIAGNOSIS — R112 Nausea with vomiting, unspecified: Secondary | ICD-10-CM | POA: Diagnosis not present

## 2015-01-18 DIAGNOSIS — R197 Diarrhea, unspecified: Secondary | ICD-10-CM | POA: Diagnosis not present

## 2015-01-18 DIAGNOSIS — Z8673 Personal history of transient ischemic attack (TIA), and cerebral infarction without residual deficits: Secondary | ICD-10-CM | POA: Insufficient documentation

## 2015-01-18 DIAGNOSIS — K644 Residual hemorrhoidal skin tags: Secondary | ICD-10-CM | POA: Insufficient documentation

## 2015-01-18 DIAGNOSIS — Z7902 Long term (current) use of antithrombotics/antiplatelets: Secondary | ICD-10-CM | POA: Insufficient documentation

## 2015-01-18 DIAGNOSIS — Z3202 Encounter for pregnancy test, result negative: Secondary | ICD-10-CM | POA: Insufficient documentation

## 2015-01-18 DIAGNOSIS — E119 Type 2 diabetes mellitus without complications: Secondary | ICD-10-CM | POA: Diagnosis not present

## 2015-01-18 LAB — COMPREHENSIVE METABOLIC PANEL
ALK PHOS: 81 U/L (ref 38–126)
ALT: 18 U/L (ref 14–54)
AST: 25 U/L (ref 15–41)
Albumin: 3.6 g/dL (ref 3.5–5.0)
Anion gap: 6 (ref 5–15)
BUN: 11 mg/dL (ref 6–20)
CALCIUM: 9.1 mg/dL (ref 8.9–10.3)
CHLORIDE: 108 mmol/L (ref 101–111)
CO2: 25 mmol/L (ref 22–32)
CREATININE: 1.09 mg/dL — AB (ref 0.44–1.00)
GFR calc Af Amer: >60 mL/min (ref >60)
GFR calc non Af Amer: >60 mL/min (ref >60)
GLUCOSE: 95 mg/dL (ref 65–99)
Potassium: 4.5 mmol/L (ref 3.5–5.1)
SODIUM: 139 mmol/L (ref 135–145)
Total Bilirubin: 0.6 mg/dL (ref 0.3–1.2)
Total Protein: 7.3 g/dL (ref 6.5–8.1)

## 2015-01-18 LAB — PREGNANCY, URINE: Preg Test, Ur: NEGATIVE

## 2015-01-18 LAB — URINE MICROSCOPIC-ADD ON

## 2015-01-18 LAB — URINALYSIS, ROUTINE W REFLEX MICROSCOPIC
BILIRUBIN URINE: NEGATIVE
Glucose, UA: NEGATIVE mg/dL
HGB URINE DIPSTICK: NEGATIVE
KETONES UR: NEGATIVE mg/dL
Leukocytes, UA: NEGATIVE
Nitrite: NEGATIVE
PH: 5.5 (ref 5.0–8.0)
Protein, ur: 100 mg/dL — AB
SPECIFIC GRAVITY, URINE: 1.019 (ref 1.005–1.030)
UROBILINOGEN UA: 0.2 mg/dL (ref 0.0–1.0)

## 2015-01-18 LAB — CBG MONITORING, ED: GLUCOSE-CAPILLARY: 82 mg/dL (ref 65–99)

## 2015-01-18 LAB — CBC
HEMATOCRIT: 44.2 % (ref 36.0–46.0)
Hemoglobin: 14.6 g/dL (ref 12.0–15.0)
MCH: 29 pg (ref 26.0–34.0)
MCHC: 33 g/dL (ref 30.0–36.0)
MCV: 87.9 fL (ref 78.0–100.0)
PLATELETS: 331 10*3/uL (ref 150–400)
RBC: 5.03 MIL/uL (ref 3.87–5.11)
RDW: 13.9 % (ref 11.5–15.5)
WBC: 11.7 10*3/uL — AB (ref 4.0–10.5)

## 2015-01-18 LAB — LIPASE, BLOOD: Lipase: 36 U/L (ref 22–51)

## 2015-01-18 LAB — POC OCCULT BLOOD, ED: Fecal Occult Bld: NEGATIVE

## 2015-01-18 MED ORDER — TRAZODONE HCL 300 MG PO TABS: 300.0000 mg | ORAL_TABLET | Freq: Every day | ORAL | Status: DC

## 2015-01-18 MED ORDER — CLOPIDOGREL BISULFATE 75 MG PO TABS: 75.0000 mg | ORAL_TABLET | Freq: Every day | ORAL | Status: DC

## 2015-01-18 MED ORDER — QUETIAPINE FUMARATE 300 MG PO TABS: 300.0000 mg | ORAL_TABLET | Freq: Every day | ORAL | Status: DC

## 2015-01-18 MED ORDER — GABAPENTIN 300 MG PO CAPS: 900.0000 mg | ORAL_CAPSULE | Freq: Three times a day (TID) | ORAL | Status: DC

## 2015-01-18 MED ORDER — ONDANSETRON HCL 4 MG/2ML IJ SOLN
4.0000 mg | Freq: Once | INTRAMUSCULAR | Status: AC | PRN
  Administered 2015-01-18: 4 mg via INTRAVENOUS
  Filled 2015-01-18: qty 2

## 2015-01-18 MED ORDER — PANTOPRAZOLE SODIUM 40 MG IV SOLR
40.0000 mg | Freq: Once | INTRAVENOUS | Status: AC
  Administered 2015-01-18: 40 mg via INTRAVENOUS
  Filled 2015-01-18: qty 40

## 2015-01-18 MED ORDER — ONDANSETRON HCL 4 MG PO TABS: 4.0000 mg | ORAL_TABLET | Freq: Four times a day (QID) | ORAL | Status: DC

## 2015-01-18 MED ORDER — RISPERIDONE 0.25 MG PO TABS: 0.2500 mg | ORAL_TABLET | Freq: Two times a day (BID) | ORAL | Status: DC

## 2015-01-18 NOTE — Discharge Instructions (Signed)
°Emergency Department Resource Guide °1) Find a Doctor and Pay Out of Pocket °Although you won't have to find out who is covered by your insurance plan, it is a good idea to ask around and get recommendations. You will then need to call the office and see if the doctor you have chosen will accept you as a new patient and what types of options they offer for patients who are self-pay. Some doctors offer discounts or will set up payment plans for their patients who do not have insurance, but you will need to ask so you aren't surprised when you get to your appointment. ° °2) Contact Your Local Health Department °Not all health departments have doctors that can see patients for sick visits, but many do, so it is worth a call to see if yours does. If you don't know where your local health department is, you can check in your phone book. The CDC also has a tool to help you locate your state's health department, and many state websites also have listings of all of their local health departments. ° °3) Find a Walk-in Clinic °If your illness is not likely to be very severe or complicated, you may want to try a walk in clinic. These are popping up all over the country in pharmacies, drugstores, and shopping centers. They're usually staffed by nurse practitioners or physician assistants that have been trained to treat common illnesses and complaints. They're usually fairly quick and inexpensive. However, if you have serious medical issues or chronic medical problems, these are probably not your best option. ° °No Primary Care Doctor: °- Call [facility] at  832-8000 - they can help you locate a primary care doctor that  accepts your insurance, provides certain services, etc. °- Physician Referral Service- 1-800-533-3463 ° °Chronic Pain Problems: °Organization         Address  Phone   Notes  °[facility]  (336) 297-2271 Patients need to be referred by their primary care doctor.  ° °Medication  Assistance: °Organization         Address  Phone   Notes  °[place] [facility] [address] °[place], [place] [address] (336) 641-8030 --Must be a resident of [place] °-- Must have NO insurance coverage whatsoever (no Medicaid/ Medicare, etc.) °-- The pt. MUST have a primary care doctor that directs their care regularly and follows them in the community °  °[facility]  (866) 331-1348   °[employer]  (888) 892-1162   ° °Agencies that provide inexpensive medical care: °Organization         Address  Phone   Notes  °[facility]  (336) 832-8035   ° Internal Medicine    (336) 832-7272   °[facility] [address] °[place], [place] [address] (336) 832-4777   °[facility] [address], °[place] (336) 271-4999   °[facility]    (336) 373-0678   °[facility]    (336) 272-1050   °[facility] ° [address], [place] Phone:  (336) 832-4444, Fax:  (336) 832-4440 Hours of Operation:  9 am - 6 pm, M-F.  Also accepts Medicaid/Medicare and self-pay.  °[facility] ° [address],  Phone: (336) 832-3150, Fax: (336) 832-3151. Hours of Operation:  8:30 am - 5:30 pm, M-F.  Also accepts Medicaid and self-pay.  °[facility] [place] [address], [place] Phone: (336) 878-6027   °[facility] [address], [place], [place] (336)723-1848, Ext. 123 Mondays & Thursdays: 7-9 AM.  First 15 patients are seen on a first come, first serve basis. °  ° °Medicaid-accepting [place] Providers: ° °Organization         Address  Phone   Notes  °[facility] [address], [place] (336) 641-2100 Also accepts self-pay patients.  °[facility] [address], [place] ° (336) 856-9996   °[facility] [address], [place]  (336) 288-8857   °[facility] [address] [place] [address], [place] (336) 299-7000   °Veita Bland [address], [place]  ° (336) 373-1557 Only accepts  Access Medicaid patients after they have their name applied to their card.  ° °Self-Pay (no insurance) in [place]: ° °Organization         Address  Phone   Notes  °[facility] [address], [place] (336) 832-1970   °[facility] [address], [place] (336) 832-4400   °[facility] ° [address], [place] (336) 992-4800   °[facility]/Dr. Osei-Bonsu ° [address] [place] [address], [place] or [address], [place] (336) 841-8500 Phone number for both [place] and [place] locations is the same.  °[facility] [address], [place] (336) 299-0000   °[facility] [address] [place] [address], [place] or [address] (336) 852-7530 °(336) 878-2260   °[facility] [address], [place] (336) 350-1642, phone; (336) 294-5005, fax Sees patients 1st and 3rd Saturday of every month.  Must not qualify for public or private insurance (i.e. Medicaid, Medicare, [place] Health Choice, Veterans' Benefits) • Household income should be no more than 200% of the poverty level •The clinic cannot treat you if you are pregnant or think you are pregnant • Sexually transmitted diseases are not treated at the clinic.  ° ° °Dental Care: °Organization         Address  Phone  Notes  °[place] [facility] [address], [place] (336) 641-6152 Accepts children up to age 21 who are enrolled in Medicaid or [place] Health Choice; pregnant women with a Medicaid card; and children who have applied for Medicaid or [place] Health Choice, but were declined, whose parents can pay a reduced fee at time of service.  °[place]  [facility] [place]  [address], [place] (336) 641-7733 Accepts children up to age 21 who are enrolled in Medicaid or [place] Health Choice; pregnant women with a Medicaid card; and children who have applied for Medicaid or  Health Choice, but were declined, whose parents can pay a reduced fee at time of service.  °[facility] ° [address], [place] (336) 641-4533 Patients are seen by appointment only. Walk-ins are not accepted. [facility] will see patients 18 years of age and older. °Monday - Tuesday (8am-5pm) °Most Wednesdays (8:30-5pm) °$30 per visit, cash only  °[facility] ° [address], [place] (336) 641-4533 Patients are seen by appointment only. Walk-ins are not accepted. [facility] will see patients 18 years of age and older. °One   Wednesday Evening (Monthly: Volunteer Based).  $30 per visit, cash only  °[facility]  (919) 537-3737 for adults; Children under age 4, call Graduate Pediatric Dentistry at (919) [phone]. Children aged 4-14, please call (919) [phone] to request a pediatric application. ° Dental services are provided in all areas of dental care including fillings, crowns and bridges, complete and partial dentures, implants, gum treatment, root canals, and extractions. Preventive care is also provided. Treatment is provided to both adults and children. °Patients are selected via a lottery and there is often a waiting list. °  °[facility] [address], °[place] ° (336) 763-8833 [email or web address] °  °[facility] [address], [place], [place] (336)723-1848, Ext. 123 Second and Fourth Thursday of each month, opens at 6:30 AM; Clinic ends at 9 AM.  Patients are seen on a first-come first-served basis, and a limited number are seen during each clinic.  ° °[facility] ° [address], [place], [place] (336) 723-7904    Eligibility Requirements °You must have lived in [place], [place], or [place] counties for at least the last three months. °  You cannot be eligible for state or federal sponsored healthcare insurance, including Veterans Administration, Medicaid, or Medicare. °  You generally cannot be eligible for healthcare insurance through your employer.  °  How to apply: °Eligibility screenings are held every Tuesday and Wednesday afternoon from 1:00 pm until 4:00 pm. You do not need an appointment for the interview!  °[facility] [address], [place], [place] 336-631-2330   °[place] [facility]  336-342-8273   °[facility]  336-703-3100   °[facility]  336-570-6415   ° °Behavioral Health Resources in the Community: °Intensive Outpatient Programs °Organization         Address  Phone  Notes  °[facility] [address], [place], [place] 336-878-6098   °[facility] [address], [place], [place] 336-832-9800   °[facility] [address], [place], [place] ° 336-882-2125   °[facility] [address],  °[place], [place] 1-800-853-5163 or 336-641-4981   °Substance Abuse Resources °Organization         Address  Phone  Notes  °[facility]  336-882-2125   °[facility]  336-784-9470   °[facility] [place] [facility]  336-285-9073   °[facility]  336-845-3988   °Residential & Outpatient Substance Abuse Program  1-800-659-3381   °Psychological Services °Organization         Address  Phone  Notes  ° Health  336- 832-9600   °[facility]  336- 378-7881   °[facility] [address], [place] 1-800-853-5163 or 336-641-4981   ° °Mobile Crisis Teams °Organization         Address  Phone  Notes  °[facility], Mobile Crisis Care Unit  1-877-626-1772   °Assertive °[facility] ° [address]  [place], [place] 336-834-9664   °Sharon DeEsch [address] °[place] 336-554-5454   ° °Self-Help/Support Groups °Organization         Address  Phone             Notes  °[facility]  - variety of support groups  336- 373-1402 Call for more information  °Narcotics Anonymous (NA), [facility] [address], °[place]  2 meetings at this location  ° °  Residential Treatment Programs °Organization         Address  Phone  Notes  °[facility] [address],    °[place]  1-866-801-8205   °[facility] ° [address], [place], [place] 704-293-8524   °[facility] [address], [place] 336-845-3988 Admissions: 8am-3pm M-F  °[facility] [address]    °[place], [place] 336-841-1104   °[facility] [address], [place], [place] 336-379-7146   °[facility] [place] [facility] [address]  °[place], [place] 336-285-9073   °[facility] Intensive Outpatient [address], [place], [place] 336-852-3033   °[facility] [address]  °[place], [place] 1-877-615-2722 or 336-784-9470   °[facility] ([facility] [address] [place], [place] 336-227-7417 Accepts Medicaid  °[facility] [address]  °[place] 1-800-659-3381 Substance Abuse/Addiction Treatment  ° °[place] Behavioral Health Resources °Organization         Address  Phone  Notes  °[facility]  (888) 581-9988   °Julie Brannon, PhD [address] [place], [place]   (336) 349-5553 or (336) 951-0000   °[facility]   [address] °[place], [place] (336) 349-4454   °[facility] [address], [place], [place] (336) 342-8316 Insurance/Medicaid/sponsorship through [facility]  °[facility] [address]                                    [place], [place] (336) 342-8316 Therapy/tele-psych/case    °[facility] [address]  ° [place], [place] (336) 349-2233    °Dr. Arfeen  (336) 349-4544   °[facility] [place]  [employer] [place] [facility] 1) [address], [place] °2) [address], [place] °3)  [address], [place] (336) 349-3220 °(336) 342-7768 ° °(336) 342-8140   °[place] [facility] (336) 342-1394 or (336) 342-3537 (After Hours)    ° ° °

## 2015-01-18 NOTE — ED Notes (Addendum)
IV attempts unsuccessful by Virgel Manifold, RN and Katha Cabal, RN.

## 2015-01-18 NOTE — ED Notes (Signed)
Called lab re: pending CMP. Lab states that the initial sample was hemolyzed but they failed to report it to the ED.  Will attempt redraw and notify PA.

## 2015-01-18 NOTE — ED Notes (Signed)
RN at bedside attempting blood draw and Iv start

## 2015-01-18 NOTE — ED Notes (Signed)
Pt c/o abdominal pain and began vomiting this morning at approx 0730. Pt c/o diarrhea that began at the same time.

## 2015-01-18 NOTE — ED Notes (Signed)
Pts repeat labs drawn and sent to lab.

## 2015-01-18 NOTE — ED Provider Notes (Signed)
CSN: [id number]     Arrival date & time 01/18/15  0920 History   First MD Initiated Contact with Patient 01/18/15 0932     Chief Complaint  Patient presents with  . Abdominal Pain  . Emesis     (Consider location/radiation/quality/duration/timing/severity/associated sxs/prior Treatment) HPI Comments: Patient presents today with complaints of generalized abdominal pain, nausea, vomiting, and diarrhea.  She reports onset of symptoms this morning.  She reports 3-4 episodes of vomiting and 3-4 episodes of diarrhea.   She does report dark red blood in her stool.  She reports a history of external hemorrhoid and states that she has noticed bright reed blood in her stool intermittently for months.  She denies hematemesis.  She denies any blood in between bowel movements.  She describes the abdominal pain as diffuse cramping.  She has not taken anything for symptoms prior to arrival.  She denies fever, chills, urinary symptoms, or vaginal discharge.  She does have a history of CVA and is currently on Plavix.  Patient denies foreign travel or known sick contacts.    The history is provided by the patient.    Past Medical History  Diagnosis Date  . Diabetes mellitus without complication   . Hypertension   . Stroke     hx of- left sided deficits   Past Surgical History  Procedure Laterality Date  . Heart valve repair as a baby     History reviewed. No pertinent family history. Social History  Substance Use Topics  . Smoking status: Current Every Day Smoker -- 1.50 packs/day for 18 years  . Smokeless tobacco: None  . Alcohol Use: No   OB History    No data available     Review of Systems  All other systems reviewed and are negative.     Allergies  Carrot; Eggs or egg-derived products; and Mushroom extract complex  Home Medications   Prior to Admission medications   Medication Sig Start Date End Date Taking? Authorizing Provider  acetaminophen (TYLENOL) 500 MG tablet Take 500 mg  by mouth every 6 (six) hours as needed for moderate pain.    Historical Provider, MD  ALPRAZolam Prudy Feeler) 1 MG tablet Take 1 mg by mouth 2 (two) times daily as needed for anxiety.    Historical Provider, MD  aspirin 81 MG tablet Take 81 mg by mouth daily.    Historical Provider, MD  clopidogrel (PLAVIX) 75 MG tablet Take 75 mg by mouth daily.    Historical Provider, MD  gabapentin (NEURONTIN) 300 MG capsule Take 900 mg by mouth 3 (three) times daily.    Historical Provider, MD  hydrOXYzine (VISTARIL) 50 MG capsule Take 50 mg by mouth at bedtime.    Historical Provider, MD  polyethylene glycol (MIRALAX / GLYCOLAX) packet Take 17 g by mouth daily.    Historical Provider, MD  QUEtiapine (SEROQUEL) 300 MG tablet Take 300 mg by mouth at bedtime.    Historical Provider, MD  risperiDONE (RISPERDAL) 0.25 MG tablet Take 0.25 mg by mouth 2 (two) times daily.    Historical Provider, MD  trazodone (DESYREL) 300 MG tablet Take 300 mg by mouth at bedtime.    Historical Provider, MD   BP 141/85 mmHg  Pulse 76  Temp(Src) 98.4 F (36.9 C) (Oral)  Resp 16  SpO2 99%  LMP 01/10/2015 Physical Exam  Constitutional: She appears well-developed and well-nourished.  HENT:  Head: Normocephalic and atraumatic.  Mouth/Throat: Oropharynx is clear and moist.  Neck: Normal range of motion.  Neck supple.  Cardiovascular: Normal rate, regular rhythm and normal heart sounds.   Pulmonary/Chest: Effort normal and breath sounds normal.  Abdominal: Soft. Bowel sounds are normal. She exhibits no distension and no mass. There is generalized tenderness. There is no rebound and no guarding.  Genitourinary: Rectal exam shows external hemorrhoid. Rectal exam shows no mass. Guaiac negative stool.  Small non thrombosed external hemorrhoid at the 6:00 position Stool brown in color  Musculoskeletal: Normal range of motion.  Neurological: She is alert.  Skin: Skin is warm and dry.  Psychiatric: She has a normal mood and affect.   Nursing note and vitals reviewed.   ED Course  Procedures (including critical care time) Labs Review Labs Reviewed - No data to display  Imaging Review No results found. I have personally reviewed and evaluated these images and lab results as part of my medical decision-making.   EKG Interpretation None     3:00 PM Reassessed patient.  Nausea improved.  Tolerating PO liquids.  Abdomen soft with mild diffuse tenderness to palpation.   MDM   Final diagnoses:  None   Patient presents today with complaints of nausea, vomiting, and diarrhea.  She also reports noticing some bright red blood in her stool earlier today.  Hemoccult today is negative.  Labs unremarkable including hemoglobin.  Abdomen is soft with mild diffuse tenderness on exam.  No localized tenderness.  No rebound or guarding.  Therefore, do not feel that imaging is indicated at this time.  Nausea improved in the ED.  No vomiting during entire ED course.  Patient tolerating PO liquids.  Feel that she is stable for discharge.  Return precautions given.      Santiago Glad, PA-C 01/19/15 1214  Glynn Octave, MD 01/20/15 [phone]

## 2015-01-18 NOTE — ED Notes (Signed)
Bed: WA22 Expected date:  Expected time:  Means of arrival:  Comments: Ems-nvd 

## 2015-02-27 ENCOUNTER — Encounter (HOSPITAL_COMMUNITY): Payer: Self-pay | Admitting: *Deleted

## 2015-02-27 ENCOUNTER — Observation Stay (HOSPITAL_COMMUNITY): Payer: Medicaid Other

## 2015-02-27 ENCOUNTER — Emergency Department (HOSPITAL_COMMUNITY): Payer: Medicaid Other

## 2015-02-27 ENCOUNTER — Inpatient Hospital Stay (HOSPITAL_COMMUNITY)
Admission: EM | Admit: 2015-02-27 | Discharge: 2015-03-04 | DRG: 064 | Disposition: A | Discharge status: Home / Self Care | Payer: Medicaid Other | Attending: Family Medicine | Admitting: Family Medicine

## 2015-02-27 DIAGNOSIS — Z8774 Personal history of (corrected) congenital malformations of heart and circulatory system: Secondary | ICD-10-CM

## 2015-02-27 DIAGNOSIS — A599 Trichomoniasis, unspecified: Secondary | ICD-10-CM | POA: Diagnosis present

## 2015-02-27 DIAGNOSIS — Z86718 Personal history of other venous thrombosis and embolism: Secondary | ICD-10-CM | POA: Diagnosis not present

## 2015-02-27 DIAGNOSIS — F1290 Cannabis use, unspecified, uncomplicated: Secondary | ICD-10-CM | POA: Diagnosis present

## 2015-02-27 DIAGNOSIS — Z91018 Allergy to other foods: Secondary | ICD-10-CM | POA: Diagnosis not present

## 2015-02-27 DIAGNOSIS — F319 Bipolar disorder, unspecified: Secondary | ICD-10-CM | POA: Insufficient documentation

## 2015-02-27 DIAGNOSIS — I10 Essential (primary) hypertension: Secondary | ICD-10-CM | POA: Diagnosis present

## 2015-02-27 DIAGNOSIS — Z7902 Long term (current) use of antithrombotics/antiplatelets: Secondary | ICD-10-CM

## 2015-02-27 DIAGNOSIS — E1140 Type 2 diabetes mellitus with diabetic neuropathy, unspecified: Secondary | ICD-10-CM | POA: Diagnosis present

## 2015-02-27 DIAGNOSIS — I959 Hypotension, unspecified: Secondary | ICD-10-CM | POA: Diagnosis not present

## 2015-02-27 DIAGNOSIS — G40909 Epilepsy, unspecified, not intractable, without status epilepticus: Secondary | ICD-10-CM | POA: Diagnosis present

## 2015-02-27 DIAGNOSIS — I6340 Cerebral infarction due to embolism of unspecified cerebral artery: Secondary | ICD-10-CM

## 2015-02-27 DIAGNOSIS — G9340 Encephalopathy, unspecified: Secondary | ICD-10-CM | POA: Diagnosis present

## 2015-02-27 DIAGNOSIS — J45909 Unspecified asthma, uncomplicated: Secondary | ICD-10-CM | POA: Diagnosis present

## 2015-02-27 DIAGNOSIS — R471 Dysarthria and anarthria: Secondary | ICD-10-CM | POA: Diagnosis present

## 2015-02-27 DIAGNOSIS — Z794 Long term (current) use of insulin: Secondary | ICD-10-CM | POA: Diagnosis not present

## 2015-02-27 DIAGNOSIS — I639 Cerebral infarction, unspecified: Secondary | ICD-10-CM | POA: Insufficient documentation

## 2015-02-27 DIAGNOSIS — F17210 Nicotine dependence, cigarettes, uncomplicated: Secondary | ICD-10-CM | POA: Diagnosis present

## 2015-02-27 DIAGNOSIS — Z803 Family history of malignant neoplasm of breast: Secondary | ICD-10-CM

## 2015-02-27 DIAGNOSIS — G8194 Hemiplegia, unspecified affecting left nondominant side: Secondary | ICD-10-CM | POA: Diagnosis present

## 2015-02-27 DIAGNOSIS — N179 Acute kidney failure, unspecified: Secondary | ICD-10-CM | POA: Diagnosis present

## 2015-02-27 DIAGNOSIS — I63133 Cerebral infarction due to embolism of bilateral carotid arteries: Secondary | ICD-10-CM | POA: Diagnosis not present

## 2015-02-27 DIAGNOSIS — Z8249 Family history of ischemic heart disease and other diseases of the circulatory system: Secondary | ICD-10-CM | POA: Diagnosis not present

## 2015-02-27 DIAGNOSIS — F209 Schizophrenia, unspecified: Secondary | ICD-10-CM | POA: Diagnosis present

## 2015-02-27 DIAGNOSIS — D6859 Other primary thrombophilia: Secondary | ICD-10-CM | POA: Diagnosis present

## 2015-02-27 DIAGNOSIS — Q211 Atrial septal defect: Secondary | ICD-10-CM | POA: Diagnosis not present

## 2015-02-27 DIAGNOSIS — Z590 Homelessness: Secondary | ICD-10-CM

## 2015-02-27 DIAGNOSIS — R4781 Slurred speech: Secondary | ICD-10-CM | POA: Diagnosis present

## 2015-02-27 DIAGNOSIS — Z6830 Body mass index (BMI) 30.0-30.9, adult: Secondary | ICD-10-CM

## 2015-02-27 DIAGNOSIS — Z95828 Presence of other vascular implants and grafts: Secondary | ICD-10-CM

## 2015-02-27 DIAGNOSIS — I6310 Cerebral infarction due to embolism of unspecified precerebral artery: Principal | ICD-10-CM | POA: Diagnosis present

## 2015-02-27 DIAGNOSIS — I6789 Other cerebrovascular disease: Secondary | ICD-10-CM | POA: Diagnosis not present

## 2015-02-27 DIAGNOSIS — Q2112 Patent foramen ovale: Secondary | ICD-10-CM | POA: Insufficient documentation

## 2015-02-27 DIAGNOSIS — R7989 Other specified abnormal findings of blood chemistry: Secondary | ICD-10-CM | POA: Diagnosis not present

## 2015-02-27 DIAGNOSIS — N390 Urinary tract infection, site not specified: Secondary | ICD-10-CM | POA: Diagnosis present

## 2015-02-27 DIAGNOSIS — R748 Abnormal levels of other serum enzymes: Secondary | ICD-10-CM | POA: Diagnosis present

## 2015-02-27 DIAGNOSIS — I6330 Cerebral infarction due to thrombosis of unspecified cerebral artery: Secondary | ICD-10-CM | POA: Diagnosis not present

## 2015-02-27 DIAGNOSIS — E785 Hyperlipidemia, unspecified: Secondary | ICD-10-CM | POA: Diagnosis present

## 2015-02-27 DIAGNOSIS — Z820 Family history of epilepsy and other diseases of the nervous system: Secondary | ICD-10-CM

## 2015-02-27 DIAGNOSIS — I6309 Cerebral infarction due to thrombosis of other precerebral artery: Secondary | ICD-10-CM | POA: Diagnosis not present

## 2015-02-27 DIAGNOSIS — Z91012 Allergy to eggs: Secondary | ICD-10-CM

## 2015-02-27 LAB — I-STAT CHEM 8, ED
BUN: 23 mg/dL — ABNORMAL HIGH (ref 6–20)
CHLORIDE: 103 mmol/L (ref 101–111)
Calcium, Ion: 1.17 mmol/L (ref 1.12–1.23)
Creatinine, Ser: 1.5 mg/dL — ABNORMAL HIGH (ref 0.44–1.00)
GLUCOSE: 116 mg/dL — AB (ref 65–99)
HEMATOCRIT: 46 % (ref 36.0–46.0)
Hemoglobin: 15.6 g/dL — ABNORMAL HIGH (ref 12.0–15.0)
POTASSIUM: 4.2 mmol/L (ref 3.5–5.1)
Sodium: 137 mmol/L (ref 135–145)
TCO2: 22 mmol/L (ref 0–100)

## 2015-02-27 LAB — DIFFERENTIAL
BASOS ABS: 0 10*3/uL (ref 0.0–0.1)
BASOS PCT: 0 %
Eosinophils Absolute: 0.1 10*3/uL (ref 0.0–0.7)
Eosinophils Relative: 1 %
Lymphocytes Relative: 14 %
Lymphs Abs: 1 10*3/uL (ref 0.7–4.0)
Monocytes Absolute: 0.5 10*3/uL (ref 0.1–1.0)
Monocytes Relative: 7 %
NEUTROS ABS: 5.9 10*3/uL (ref 1.7–7.7)
Neutrophils Relative %: 78 %

## 2015-02-27 LAB — RAPID URINE DRUG SCREEN, HOSP PERFORMED
AMPHETAMINES: NOT DETECTED
BENZODIAZEPINES: NOT DETECTED
Barbiturates: NOT DETECTED
COCAINE: NOT DETECTED
OPIATES: NOT DETECTED
Tetrahydrocannabinol: POSITIVE — AB

## 2015-02-27 LAB — URINALYSIS, ROUTINE W REFLEX MICROSCOPIC
Bilirubin Urine: NEGATIVE
GLUCOSE, UA: NEGATIVE mg/dL
Ketones, ur: NEGATIVE mg/dL
NITRITE: NEGATIVE
PROTEIN: 30 mg/dL — AB
Specific Gravity, Urine: 1.011 (ref 1.005–1.030)
UROBILINOGEN UA: 0.2 mg/dL (ref 0.0–1.0)
pH: 7 (ref 5.0–8.0)

## 2015-02-27 LAB — ANTITHROMBIN III: ANTITHROMB III FUNC: 100 % (ref 75–120)

## 2015-02-27 LAB — URINE MICROSCOPIC-ADD ON

## 2015-02-27 LAB — COMPREHENSIVE METABOLIC PANEL
ALBUMIN: 3.7 g/dL (ref 3.5–5.0)
ALT: 17 U/L (ref 14–54)
AST: 26 U/L (ref 15–41)
Alkaline Phosphatase: 87 U/L (ref 38–126)
Anion gap: 12 (ref 5–15)
BUN: 19 mg/dL (ref 6–20)
CHLORIDE: 101 mmol/L (ref 101–111)
CO2: 21 mmol/L — AB (ref 22–32)
CREATININE: 1.5 mg/dL — AB (ref 0.44–1.00)
Calcium: 9.5 mg/dL (ref 8.9–10.3)
GFR calc Af Amer: 51 mL/min — ABNORMAL LOW (ref >60)
GFR calc non Af Amer: 44 mL/min — ABNORMAL LOW (ref >60)
Glucose, Bld: 114 mg/dL — ABNORMAL HIGH (ref 65–99)
Potassium: 4.3 mmol/L (ref 3.5–5.1)
SODIUM: 134 mmol/L — AB (ref 135–145)
Total Bilirubin: 0.3 mg/dL (ref 0.3–1.2)
Total Protein: 7.5 g/dL (ref 6.5–8.1)

## 2015-02-27 LAB — ETHANOL

## 2015-02-27 LAB — PROTIME-INR
INR: 1 (ref 0.00–1.49)
PROTHROMBIN TIME: 13.4 s (ref 11.6–15.2)

## 2015-02-27 LAB — CBC
HEMATOCRIT: 43 % (ref 36.0–46.0)
Hemoglobin: 14.1 g/dL (ref 12.0–15.0)
MCH: 28.4 pg (ref 26.0–34.0)
MCHC: 32.8 g/dL (ref 30.0–36.0)
MCV: 86.7 fL (ref 78.0–100.0)
PLATELETS: 177 10*3/uL (ref 150–400)
RBC: 4.96 MIL/uL (ref 3.87–5.11)
RDW: 14.4 % (ref 11.5–15.5)
WBC: 7.4 10*3/uL (ref 4.0–10.5)

## 2015-02-27 LAB — GLUCOSE, CAPILLARY: Glucose-Capillary: 85 mg/dL (ref 65–99)

## 2015-02-27 LAB — APTT: APTT: 26 s (ref 24–37)

## 2015-02-27 LAB — I-STAT TROPONIN, ED: Troponin i, poc: 0.09 ng/mL (ref 0.00–0.08)

## 2015-02-27 LAB — PREGNANCY, URINE: PREG TEST UR: NEGATIVE

## 2015-02-27 LAB — CBG MONITORING, ED: GLUCOSE-CAPILLARY: 125 mg/dL — AB (ref 65–99)

## 2015-02-27 MED ORDER — ALBUTEROL SULFATE (2.5 MG/3ML) 0.083% IN NEBU: 2.5000 mg | INHALATION_SOLUTION | Freq: Four times a day (QID) | RESPIRATORY_TRACT | Status: DC | PRN

## 2015-02-27 MED ORDER — CLOPIDOGREL BISULFATE 75 MG PO TABS
75.0000 mg | ORAL_TABLET | Freq: Every day | ORAL | Status: DC
  Administered 2015-02-27 – 2015-03-04 (×6): 75 mg via ORAL
  Filled 2015-02-27 (×6): qty 1

## 2015-02-27 MED ORDER — GABAPENTIN 300 MG PO CAPS
900.0000 mg | ORAL_CAPSULE | Freq: Three times a day (TID) | ORAL | Status: DC
  Administered 2015-02-27 – 2015-02-28 (×2): 900 mg via ORAL
  Filled 2015-02-27 (×2): qty 3

## 2015-02-27 MED ORDER — SODIUM CHLORIDE 0.9 % IV SOLN
INTRAVENOUS | Status: DC
  Administered 2015-02-27 – 2015-02-28 (×3): via INTRAVENOUS
  Filled 2015-02-27: qty 1000

## 2015-02-27 MED ORDER — RISPERIDONE 0.5 MG PO TABS
0.2500 mg | ORAL_TABLET | Freq: Two times a day (BID) | ORAL | Status: DC
  Administered 2015-02-27 – 2015-03-03 (×8): 0.25 mg via ORAL
  Administered 2015-03-03: 25 mg via ORAL
  Administered 2015-03-04: 0.25 mg via ORAL
  Filled 2015-02-27 (×10): qty 1

## 2015-02-27 MED ORDER — STROKE: EARLY STAGES OF RECOVERY BOOK
Freq: Once | Status: AC
  Administered 2015-02-27: 19:00:00

## 2015-02-27 MED ORDER — QUETIAPINE FUMARATE 200 MG PO TABS
300.0000 mg | ORAL_TABLET | Freq: Every day | ORAL | Status: DC
  Administered 2015-02-27 – 2015-03-03 (×5): 300 mg via ORAL
  Filled 2015-02-27 (×2): qty 2
  Filled 2015-02-27 (×4): qty 1
  Filled 2015-02-27 (×3): qty 2
  Filled 2015-02-27: qty 1

## 2015-02-27 MED ORDER — ENOXAPARIN SODIUM 40 MG/0.4ML ~~LOC~~ SOLN
40.0000 mg | SUBCUTANEOUS | Status: DC
  Administered 2015-02-27 – 2015-03-03 (×5): 40 mg via SUBCUTANEOUS
  Filled 2015-02-27 (×5): qty 0.4

## 2015-02-27 MED ORDER — ALBUTEROL SULFATE HFA 108 (90 BASE) MCG/ACT IN AERS: 2.0000 | INHALATION_SPRAY | Freq: Four times a day (QID) | RESPIRATORY_TRACT | Status: DC | PRN

## 2015-02-27 NOTE — Consult Note (Signed)
Referring Physician: Gwendolyn Grant    Chief Complaint: left sided weakness  HPI:                                                                                                                                         Tanya Mayer is an 34 y.o. female who lives in a homeless shelter.  She was last seen in the hospital this September for left sided wekness and found to have 2 punctate strokes in right hemisphere. She was admitted but left AMA. She returns today stating she has left sided weakness. On exam she is a poor historian, will speak clearly to start but then mumbles her words but again will speak clearly at the end of her sentence.  She perseverates on the fact she has had multiple strokes. She is stating she cannot move her left side. MRI of her brain showed bilateral frontal cortical acute ischemic infarctions.  Date last known well: Date: 02/26/2015 Time last known well: Time: 21:30 tPA Given: No: out of window     Past Medical History  Diagnosis Date  . Diabetes mellitus without complication (HCC)   . Hypertension   . Stroke [facility])     hx of- left sided deficits    Past Surgical History  Procedure Laterality Date  . Heart valve repair as a baby      Family History  Problem Relation Age of Onset  . Hypertension Mother    Social History:  reports that she has been smoking.  She does not have any smokeless tobacco history on file. She reports that she uses illicit drugs (Marijuana). She reports that she does not drink alcohol.  Allergies:  Allergies  Allergen Reactions  . Carrot [Daucus Carota] Anaphylaxis  . Eggs Or Egg-Derived Products Anaphylaxis and Swelling  . Mushroom Extract Complex Anaphylaxis    Medications:                                                                                                                           No current facility-administered medications for this encounter.   Current Outpatient Prescriptions  Medication Sig Dispense Refill  .  acetaminophen (TYLENOL) 500 MG tablet Take 500 mg by mouth every 6 (six) hours as needed for moderate pain.    Marland Kitchen albuterol (PROVENTIL HFA;VENTOLIN HFA) 108 (90 BASE) MCG/ACT inhaler Inhale 2 puffs into the lungs  every 6 (six) hours as needed for wheezing or shortness of breath.    . clopidogrel (PLAVIX) 75 MG tablet Take 1 tablet (75 mg total) by mouth daily. 10 tablet 0  . gabapentin (NEURONTIN) 300 MG capsule Take 3 capsules (900 mg total) by mouth 3 (three) times daily. 90 capsule 0  . hydrochlorothiazide (MICROZIDE) 12.5 MG capsule Take 12.5 mg by mouth daily.    . hydrOXYzine (VISTARIL) 50 MG capsule Take 50 mg by mouth at bedtime.    . polyethylene glycol (MIRALAX / GLYCOLAX) packet Take 17 g by mouth daily.    . QUEtiapine (SEROQUEL) 300 MG tablet Take 1 tablet (300 mg total) by mouth at bedtime. 10 tablet 0  . risperiDONE (RISPERDAL) 0.25 MG tablet Take 1 tablet (0.25 mg total) by mouth 2 (two) times daily. 20 tablet 0  . trazodone (DESYREL) 300 MG tablet Take 1 tablet (300 mg total) by mouth at bedtime. 14 tablet 0     ROS:                                                                                                                                       History obtained from the patient  General ROS: negative for - chills, fatigue, fever, night sweats, weight gain or weight loss Psychological ROS: negative for - behavioral disorder, hallucinations, memory difficulties, mood swings or suicidal ideation Ophthalmic ROS: negative for - blurry vision, double vision, eye pain or loss of vision ENT ROS: negative for - epistaxis, nasal discharge, oral lesions, sore throat, tinnitus or vertigo Allergy and Immunology ROS: negative for - hives or itchy/watery eyes Hematological and Lymphatic ROS: negative for - bleeding problems, bruising or swollen lymph nodes Endocrine ROS: negative for - galactorrhea, hair pattern changes, polydipsia/polyuria or temperature intolerance Respiratory ROS:  negative for - cough, hemoptysis, shortness of breath or wheezing Cardiovascular ROS: negative for - chest pain, dyspnea on exertion, edema or irregular heartbeat Gastrointestinal ROS: negative for - abdominal pain, diarrhea, hematemesis, nausea/vomiting or stool incontinence Genito-Urinary ROS: negative for - dysuria, hematuria, incontinence or urinary frequency/urgency Musculoskeletal ROS: negative for - joint swelling or muscular weakness Neurological ROS: as noted in HPI Dermatological ROS: negative for rash and skin lesion changes  Neurologic Examination:                                                                                                      Blood pressure 108/68, pulse 85, temperature 97.5 F (36.4 C), temperature source Oral,  resp. rate 15, height 5' (1.524 m), weight 71.215 kg (157 lb), last menstrual period 02/20/2015, SpO2 94 %.  HEENT-  Normocephalic, no lesions, without obvious abnormality.  Normal external eye and conjunctiva.  Normal TM's bilaterally.  Normal auditory canals and external ears. Normal external nose, mucus membranes and septum.  Normal pharynx. Cardiovascular- S1, S2 normal, pulses palpable throughout   Lungs- chest clear, no wheezing, rales, normal symmetric air entry Abdomen- normal findings: bowel sounds normal Extremities- no joint deformities, effusion, or inflammation Lymph-no adenopathy palpable Musculoskeletal-no joint tenderness, deformity or swelling Skin-warm and dry, no hyperpigmentation, vitiligo, or suspicious lesions  Neurological Examination Mental Status: Alert, oriented, thought content appropriate.  Speech fluent at times and dysarthric at others (functional--does not move her lips or jaw when talking) without evidence of aphasia.  Able to follow 3 step commands without difficulty. Cranial Nerves: II: Discs flat bilaterally; Visual fields grossly normal, pupils equal, round, reactive to light and accommodation III,IV, VI: ptosis  not present, extra-ocular motions intact bilaterally V,VII: smile asymmetric with left NLF decrease, facial light touch sensation normal bilaterally VIII: hearing normal bilaterally IX,X: uvula rises symmetrically XI: bilateral shoulder shrug XII: midline tongue extension Motor: Right : Upper extremity   5/5    Left:     Upper extremity   3/5--poor effort, does not allow hand to hit face when held above head, when distracted shows good strength.   Lower extremity   5/5     Lower extremity   5/5--holds leg in extension and will not allow me to bend leg Tone and bulk:normal tone throughout; no atrophy noted Sensory: Pinprick and light touch intact throughout, bilaterally Deep Tendon Reflexes: 2+ and symmetric throughout Plantars: Right: downgoing   Left: downgoing Cerebellar: normal finger-to-nose on the right and normal heel-to-shin test on right --this again fluctuates Gait: not tested       Lab Results: Basic Metabolic Panel:  Recent Labs Lab 02/27/15 1014 02/27/15 1021  NA 134* 137  K 4.3 4.2  CL 101 103  CO2 21*  --   GLUCOSE 114* 116*  BUN 19 23*  CREATININE 1.50* 1.50*  CALCIUM 9.5  --     Liver Function Tests:  Recent Labs Lab 02/27/15 1014  AST 26  ALT 17  ALKPHOS 87  BILITOT 0.3  PROT 7.5  ALBUMIN 3.7   No results for input(s): LIPASE, AMYLASE in the last 168 hours. No results for input(s): AMMONIA in the last 168 hours.  CBC:  Recent Labs Lab 02/27/15 1014 02/27/15 1021  WBC 7.4  --   NEUTROABS 5.9  --   HGB 14.1 15.6*  HCT 43.0 46.0  MCV 86.7  --   PLT 177  --     Cardiac Enzymes: No results for input(s): CKTOTAL, CKMB, CKMBINDEX, TROPONINI in the last 168 hours.  Lipid Panel: No results for input(s): CHOL, TRIG, HDL, CHOLHDL, VLDL, LDLCALC in the last 168 hours.  CBG:  Recent Labs Lab 02/27/15 1004  GLUCAP 125*    Microbiology: Results for orders placed or performed during the hospital encounter of 07/03/10  Urine culture      Status: None   Collection Time: 07/03/10  8:45 AM  Result Value Ref Range Status   Specimen Description URINE, RANDOM  Final   Special Requests NONE  Final   Culture  Setup Time [id number]  Final   Colony Count >=100,000 COLONIES/ML  Final   Culture   Final    Multiple bacterial morphotypes present, none predominant.  Suggest appropriate recollection if clinically indicated.   Report Status 07/04/2010 FINAL  Final    Coagulation Studies:  Recent Labs  02/27/15 1014  LABPROT 13.4  INR 1.00    Imaging: Ct Head Wo Contrast  02/27/2015  CLINICAL DATA:  Left-sided weakness, slurred speech, and facial droopupon awakening. EXAM: CT HEAD WITHOUT CONTRAST TECHNIQUE: Contiguous axial images were obtained from the base of the skull through the vertex without intravenous contrast. COMPARISON:  01/03/2015 FINDINGS: Skull and Sinuses:Negative for fracture or destructive process. The mastoids, middle ears, and imaged paranasal sinuses are clear. Orbits: No acute abnormality. Brain: No evidence of acute infarction, hemorrhage, hydrocephalus, or shift. 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 IMPRESSION: Negative head CT. Electronically Signed   By: Marnee Spring M.D.   On: 02/27/2015 11:01   Dg Chest Port 1 View  02/27/2015  CLINICAL DATA:  Shortness of breath. Stroke symptoms with left-sided weakness. EXAM: PORTABLE CHEST 1 VIEW COMPARISON:  01/03/2015 FINDINGS: The cardiac silhouette is upper limits of normal to mildly enlarged in size, unchanged. The patient has taken a shallower inspiration than on the prior study with minimal left basilar atelectasis noted. No pulmonary edema, pleural effusion, or pneumothorax is identified. No acute osseous abnormality is seen. IMPRESSION: Hypoinflation without evidence of acute cardiopulmonary disease. Electronically Signed   By: Sebastian Ache M.D.   On: 02/27/2015 10:38       Assessment and plan discussed with with attending physician and  they are in agreement.    Felicie Morn PA-C [facility] [phone]  02/27/2015, 2:21 PM   Assessment: 35 y.o. female presenting with left sided weakness with MRI findings consistent with acute right frontal cortex stroke, as well as small acute left frontal cortex stroke, suggestive of embolic phenomena.  Stroke Risk Factors - diabetes mellitus and hypertension   Recommend: 1. HgbA1c, fasting lipid panel 2. MRA  of the brain without contrast 3. PT consult, OT consult, Speech consult 4. Echocardiogram 5. Carotid dopplers 6. Prophylactic therapy-Antiplatelet med: Plavix - dose 75 mg daily 7. Risk factor modification 8. Hypercoagulopathy panel 9. Frequent neuro checks 10 NPO until passes stroke swallow screen  I personally participated in this patient's evaluation and management, including formulating the above clinical impression and management recommendations.  Venetia Maxon M.D. [facility] [phone]

## 2015-02-27 NOTE — ED Notes (Signed)
Pt CBG, 125. Nurse was notified. 

## 2015-02-27 NOTE — ED Notes (Signed)
Patient transported to CT 

## 2015-02-27 NOTE — H&P (Signed)
[facility] History and Physical Service Pager: [phone]  Patient name: Tanya Mayer Medical record number: [id number] Date of birth: 11/29/1980 Age: 34 y.o. Gender: female  Primary Care Provider: No primary care provider on file. Consultants: neuorlogy Code Status: full  Chief Complaint: slurred speech and left sided weakness  Assessment and Plan: Tanya Mayer is a 34 y.o. female presenting with slurred speech and left sided weakness . PMH is significant for clotting disorder, DVT, hypertension, DM-2, Epilepsy, Bipolar disorder, Schizophrenia, depression, CVA in the past x 2 ( 09/2014, 04/2014 per pt.), ?IVC filter, Congenital heart defect s/p valve repair? Per pt.   Hemiplegia and slurred speech: ischemic stroke. CT head negative. MRI brain with acute infarct in the right frontal cortex 2.5 cm, small acute infarct left frontal cortex suggestive of emboli. Three CVA's in the past one year  Apparently treated in [place] per pt. Hx of hypercoagulability, DVT, and IVC filter placement per pt. She is on plavix and ASA prescribed by her physicians in [place]. Additionally she says that she had a "valve repair" as a baby, but does not know if she has a PFO. Exam remarkable for left sided decreased sensation. Motor 3/5 in left UE and LE. Down-going plantar reflex bilaterally. CN II-XII mostly in tact with possible mild facial weakness on the left. Out of the tpa window as she woke up this am with symptoms.  -Admit to telemetry. Attending Dr. Gwendolyn GrantWalden - Neuro checks q 2hrs - Appreciate neuro recs: - Plavix 75 mg daily    - Risk stratification labs  - Hypercoagulablity labs. Need to determine what her hypercoagulability defect is. Also follow up records.  - Consider carotid doppler - Echo with bubble study. - LE PVL - permissive htn.  - Records from outside hospital especially regarding IVC filter. - PT/OT/SLP eval  Elevated troponin: initial  troponin of 0.09. No complaint of chest pain. HDS. EKG with flattened t-waves in the precordial leads.  -Will continue trend -f/u EKG in AM  -consult card if continue to trend up or EKG change.   Hypertension: normotensive here. On HCTZ at home -Hold while NPO. Will restart as needed  DM-2: reports blood sugar of 360 yesterday. CBG 125 here. On levemir 30 unit bid  - A1c  - SSI   AKI: creatinine 1.5. B/l 1.2-1.3  -will monitor BMP - will give fluids overnight.   Hx of DVT: Says she has IVC placed in 08/2014. Has hx of blood clot since she was young. Mother with hx of blood clot as well -Hypercoagulability panel  - Will get LE doppler  H/o Schizophrenia: on risperidone 0.25 mg at home - hold while NPO and restart home dose after SLP eval - discuss compliance with pt.   H/o of Seizure: after she had stroke. Started on Keppra but pt. Didn't feel like it worked for her? Not on any meds now. -will discuss with neuro - supposed to be on keppra at home. Will load and restart here.   Asthma: stable now. On albuterol at home. Didn't use it within last week. Course sounds on lung exams -lbuterol PRNd  Social: [place] [facility] [place] in [place]. She lives in [facility] with her husband. Denies domestic violence. She says [facility], and at [facility] in [place].    FEN/GI: NPO pending SLP eval  Prophylaxis:  -Lovonex   Disposition: med-surg.  Stroke work up  History of  Present Illness:  Tanya Mayer is a 34 y.o. female presenting via EMS with slurred speech and left-sided weakness.  Patient states that she has had several strokes in the past, first in 04/2014 due to blood clot in her brain. She had heart valve  repair as a child. She can't tell which valve. She was born prematurely.  Yesterday in the afternoon, she started to have left sided face twitch. She says her blood glucose was also 360. She went to bed about 9 pm.  This  morning she woke up with slurred speech, left sided weakness (has some left sided weakness at baseline from stroke in the past). She has decreased sensation on the left side and numbness in left foot. She also DM neuropathy. No vision changes.   Patient has history of DVT. Has history of blood clotting d/o since she was in high school. She says she has IVC placed in 08/2014 at [facility] in [place].   Denies SOB, chest pain, N/V, neck or jaw pain.   Reports taking her medication at home. She take Levemir 30 units bid and HCTZ for hypertension. She has been on Plavix since she had her third stroke.  Family h/o significant for blood clot, epilepsy and breast cancer in mother.  Patient moved [place] from [place] in August 2016. She says it is too cold up there. Says she "likes to start life over here".  She lives in shelter at [facility]. Smokes one cigarette a day. Reports using Marijuana for pain occasionally. Denies EtOH or other drug use.  ED course: CT head negative. MRI brain with acute infarct in the right frontal cortex 2.5 cm, small acute infarct left frontal cortex suggestive of emboli. Neurology consulted. Troponin 0.09. UDS positive for THC.  Review Of Systems: Per HPI with the following additions:  Otherwise 12 point review of systems was performed and was unremarkable.  Patient Active Problem List   Diagnosis Date Noted  . Acute encephalopathy 02/27/2015  . Stroke (HCC) 01/03/2015  . Essential hypertension 01/03/2015  . DM (diabetes mellitus) type II controlled, neurological manifestation (HCC) 01/03/2015  . Hyperlipidemia 01/03/2015  . Hemiparesis affecting left side as late effect of cerebrovascular accident (HCC) 01/03/2015  . Tobacco abuse 01/03/2015  . Marijuana abuse 01/03/2015  . Asthma 01/03/2015  . History of DVT of lower extremity 01/03/2015   Past Medical History: Past Medical History  Diagnosis Date  . Diabetes mellitus without complication (HCC)   .  Hypertension   . Stroke [facility])     hx of- left sided deficits   Past Surgical History: Past Surgical History  Procedure Laterality Date  . Heart valve repair as a baby     Social History: Social History  Substance Use Topics  . Smoking status: Current Every Day Smoker -- 1.00 packs/day for 18 years  . Smokeless tobacco: None  . Alcohol Use: No   Additional social history: as in HPI Please also refer to relevant sections of EMR.  Family History: Family History  Problem Relation Age of Onset  . Hypertension Mother    Allergies and Medications: Allergies  Allergen Reactions  . Carrot [Daucus Carota] Anaphylaxis  . Eggs Or Egg-Derived Products Anaphylaxis and Swelling  . Mushroom Extract Complex Anaphylaxis   No current facility-administered medications on file prior to encounter.   Current Outpatient Prescriptions on File Prior to Encounter  Medication Sig Dispense Refill  . acetaminophen (TYLENOL) 500 MG tablet Take 500 mg by mouth every 6 (six)  hours as needed for moderate pain.    Marland Kitchen albuterol (PROVENTIL HFA;VENTOLIN HFA) 108 (90 BASE) MCG/ACT inhaler Inhale 2 puffs into the lungs every 6 (six) hours as needed for wheezing or shortness of breath.    . clopidogrel (PLAVIX) 75 MG tablet Take 1 tablet (75 mg total) by mouth daily. 10 tablet 0  . gabapentin (NEURONTIN) 300 MG capsule Take 3 capsules (900 mg total) by mouth 3 (three) times daily. 90 capsule 0  . hydrochlorothiazide (MICROZIDE) 12.5 MG capsule Take 12.5 mg by mouth daily.    . hydrOXYzine (VISTARIL) 50 MG capsule Take 50 mg by mouth at bedtime.    . polyethylene glycol (MIRALAX / GLYCOLAX) packet Take 17 g by mouth daily.    . QUEtiapine (SEROQUEL) 300 MG tablet Take 1 tablet (300 mg total) by mouth at bedtime. 10 tablet 0  . risperiDONE (RISPERDAL) 0.25 MG tablet Take 1 tablet (0.25 mg total) by mouth 2 (two) times daily. 20 tablet 0  . trazodone (DESYREL) 300 MG tablet Take 1 tablet (300 mg total) by mouth at  bedtime. 14 tablet 0    Objective: BP 108/68 mmHg  Pulse 85  Temp(Src) 97.5 F (36.4 C) (Oral)  Resp 15  Ht 5' (1.524 m)  Wt 157 lb (71.215 kg)  BMI 30.66 kg/m2  SpO2 94%  LMP 02/20/2015 Exam: Gen: sleepy, slurred speech Eyes: PERRLA, EOMI, sclera anicteric, no conjunctival injection Nares: clear, no erythema, swelling or congestion Oropharynx: clear, moist CV: RRR. S1 & S2 audible, no murmurs, rubs.. Resp: no apparent WOB, CTAB. Abd: +BS. Soft, NDNT, no rebound or guarding.  Ext: No edema or gross deformities. Neuro: slurred speech, answers questions appropriately, CN-V & XI deficit on left side, motor 3/5 in LUE and LLE. Finger to nose normal on right. Not able assess on left. Plantar reflex-down going bilaterally. Knee reflex 2+ bilaterally.   Labs and Imaging: CBC BMET   Recent Labs Lab 02/27/15 1014 02/27/15 1021  WBC 7.4  --   HGB 14.1 15.6*  HCT 43.0 46.0  PLT 177  --     Recent Labs Lab 02/27/15 1014 02/27/15 1021  NA 134* 137  K 4.3 4.2  CL 101 103  CO2 21*  --   BUN 19 23*  CREATININE 1.50* 1.50*  GLUCOSE 114* 116*  CALCIUM 9.5  --      Ct Head Wo Contrast  02/27/2015  CLINICAL DATA:  Left-sided weakness, slurred speech, and facial droopupon awakening. EXAM: CT HEAD WITHOUT CONTRAST TECHNIQUE: Contiguous axial images were obtained from the base of the skull through the vertex without intravenous contrast. COMPARISON:  01/03/2015 FINDINGS: Skull and Sinuses:Negative for fracture or destructive process. The mastoids, middle ears, and imaged paranasal sinuses are clear. Orbits: No acute abnormality. Brain: No evidence of acute infarction, hemorrhage, hydrocephalus, or shift. 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 IMPRESSION: Negative head CT. Electronically Signed   By: Marnee Spring M.D.   On: 02/27/2015 11:01   Mr Brain Wo Contrast  02/27/2015  CLINICAL DATA:  Left hemiparesis.  Mental status change. EXAM: MRI HEAD WITHOUT CONTRAST  TECHNIQUE: Multiplanar, multiecho pulse sequences of the brain and surrounding structures were obtained without intravenous contrast. COMPARISON:  CT head 02/27/2015.  MRI 01/03/2015 FINDINGS: Acute infarct right frontal cortex . This measures 2.5 cm. Small acute infarct left frontal cortex. No associated hemorrhage Ventricle size normal.  Cerebral volume normal. Cerebral white matter normal without evidence of chronic ischemia. Negative for demyelinating disease. Brainstem and cerebellum  and basal ganglia normal. Negative for mass or edema. Pituitary normal in size.  Orbits normal.  Paranasal sinuses clear. IMPRESSION: Acute infarct in the right frontal cortex 2.5 cm. Small acute infarct left frontal cortex. Findings are suggestive of emboli. Recommend evaluation for cardiac source of emboli. Electronically Signed   By: Marlan Palau M.D.   On: 02/27/2015 15:59   Dg Chest Port 1 View  02/27/2015  CLINICAL DATA:  Shortness of breath. Stroke symptoms with left-sided weakness. EXAM: PORTABLE CHEST 1 VIEW COMPARISON:  01/03/2015 FINDINGS: The cardiac silhouette is upper limits of normal to mildly enlarged in size, unchanged. The patient has taken a shallower inspiration than on the prior study with minimal left basilar atelectasis noted. No pulmonary edema, pleural effusion, or pneumothorax is identified. No acute osseous abnormality is seen. IMPRESSION: Hypoinflation without evidence of acute cardiopulmonary disease. Electronically Signed   By: Sebastian Ache M.D.   On: 02/27/2015 10:38    Almon Hercules, MD 02/27/2015, 2:27 PM PGY-1, [facility] Intern pager: [phone], text pages welcome  I agree with the above evaluation, assessment, and plan. Any correctional changes can be noted in [facility]   Yolande Jolly, MD Family Medicine Resident - PGY 2

## 2015-02-27 NOTE — ED Notes (Signed)
admitting MD at bedside

## 2015-02-27 NOTE — Progress Notes (Signed)
Patient arrived to 5M10. Patient is drowsy, oriented x4, follows commands. NIHHS 13. Q2 vitals and neuro checks initiated. Aspiration precautions set up. Per ED RN, patient failed SSS, order for SLP bedside swallow in place. Patient oriented to room, unit, staff. Safety measures in place including bed alarm on.

## 2015-02-27 NOTE — ED Notes (Signed)
Pt presents via [facility] for left sided weakness, slurred speech and left sided facial droop.  Pt went to bed at 930 pm normal and woke up at 0700 with theses symptoms.  Hx: stroke with left sided deficits.  BP-116/80 P-87 NSR, CBG 150, pt a x 4, NAD.

## 2015-02-27 NOTE — ED Provider Notes (Addendum)
CSN: [id number]     Arrival date & time 02/27/15  [id number] History   First MD Initiated Contact with Patient 02/27/15 1006     No chief complaint on file.    (Consider location/radiation/quality/duration/timing/severity/associated sxs/prior Treatment) HPI Comments: Patient brought to the emergency department by ambulance for left-sided weakness and slurred speech. Patient reportedly went to bed at 9:30 last night and then woke up this morning with the symptoms. Patient does have a previous history of stroke with left-sided deficit.   Past Medical History  Diagnosis Date  . Diabetes mellitus without complication (HCC)   . Hypertension   . Stroke [facility](HCC)     hx of- left sided deficits   Past Surgical History  Procedure Laterality Date  . Heart valve repair as a baby     No family history on file. Social History  Substance Use Topics  . Smoking status: Current Every Day Smoker -- 1.00 packs/day for 18 years  . Smokeless tobacco: None  . Alcohol Use: No   OB History    No data available     Review of Systems  Neurological: Positive for speech difficulty and weakness.  All other systems reviewed and are negative.     Allergies  Carrot; Eggs or egg-derived products; and Mushroom extract complex  Home Medications   Prior to Admission medications   Medication Sig Start Date End Date Taking? Authorizing Provider  acetaminophen (TYLENOL) 500 MG tablet Take 500 mg by mouth every 6 (six) hours as needed for moderate pain.   Yes Historical Provider, MD  albuterol (PROVENTIL HFA;VENTOLIN HFA) 108 (90 BASE) MCG/ACT inhaler Inhale 2 puffs into the lungs every 6 (six) hours as needed for wheezing or shortness of breath.   Yes Historical Provider, MD  clopidogrel (PLAVIX) 75 MG tablet Take 1 tablet (75 mg total) by mouth daily. 01/18/15  Yes Heather Laisure, PA-C  gabapentin (NEURONTIN) 300 MG capsule Take 3 capsules (900 mg total) by mouth 3 (three) times daily. 01/18/15  Yes Heather Laisure,  PA-C  hydrochlorothiazide (MICROZIDE) 12.5 MG capsule Take 12.5 mg by mouth daily.   Yes Historical Provider, MD  hydrOXYzine (VISTARIL) 50 MG capsule Take 50 mg by mouth at bedtime.   Yes Historical Provider, MD  polyethylene glycol (MIRALAX / GLYCOLAX) packet Take 17 g by mouth daily.   Yes Historical Provider, MD  QUEtiapine (SEROQUEL) 300 MG tablet Take 1 tablet (300 mg total) by mouth at bedtime. 01/18/15  Yes Heather Laisure, PA-C  risperiDONE (RISPERDAL) 0.25 MG tablet Take 1 tablet (0.25 mg total) by mouth 2 (two) times daily. 01/18/15  Yes Heather Laisure, PA-C  trazodone (DESYREL) 300 MG tablet Take 1 tablet (300 mg total) by mouth at bedtime. 01/18/15  Yes Heather Laisure, PA-C   BP 97/69 mmHg  Pulse 77  Temp(Src) 97.5 F (36.4 C) (Oral)  Resp 13  Ht 5' (1.524 m)  Wt 157 lb (71.215 kg)  BMI 30.66 kg/m2  SpO2 96%  LMP 02/20/2015 Physical Exam  Constitutional: She appears well-developed and well-nourished. She appears listless. No distress.  HENT:  Head: Normocephalic and atraumatic.  Right Ear: Hearing normal.  Left Ear: Hearing normal.  Nose: Nose normal.  Mouth/Throat: Oropharynx is clear and moist and mucous membranes are normal.  Eyes: Conjunctivae and EOM are normal. Pupils are equal, round, and reactive to light.  Neck: Normal range of motion. Neck supple.  Cardiovascular: Regular rhythm, S1 normal and S2 normal.  Exam reveals no gallop and no friction rub.  No murmur heard. Pulmonary/Chest: Effort normal and breath sounds normal. No respiratory distress. She exhibits no tenderness.  Abdominal: Soft. Normal appearance and bowel sounds are normal. There is no hepatosplenomegaly. There is no tenderness. There is no rebound, no guarding, no tenderness at McBurney's point and negative Murphy's sign. No hernia.  Musculoskeletal: Normal range of motion.  Neurological: She has normal strength. She appears listless. A cranial nerve deficit and sensory deficit is present. She  exhibits abnormal muscle tone (left hemiparesis). Coordination normal. GCS eye subscore is 4. GCS verbal subscore is 5. GCS motor subscore is 6.  somnolent with slurred speech  Skin: Skin is warm, dry and intact. No rash noted. No cyanosis.  Psychiatric: She has a normal mood and affect. Her speech is normal and behavior is normal. Thought content normal.  Nursing note and vitals reviewed.   ED Course  Procedures (including critical care time) Labs Review Labs Reviewed  COMPREHENSIVE METABOLIC PANEL - Abnormal; Notable for the following:    Sodium 134 (*)    CO2 21 (*)    Glucose, Bld 114 (*)    Creatinine, Ser 1.50 (*)    GFR calc non Af Amer 44 (*)    GFR calc Af Amer 51 (*)    All other components within normal limits  URINALYSIS, ROUTINE W REFLEX MICROSCOPIC (NOT AT [facility]) - Abnormal; Notable for the following:    Hgb urine dipstick MODERATE (*)    Protein, ur 30 (*)    Leukocytes, UA TRACE (*)    All other components within normal limits  URINE RAPID DRUG SCREEN, HOSP PERFORMED - Abnormal; Notable for the following:    Tetrahydrocannabinol POSITIVE (*)    All other components within normal limits  URINE MICROSCOPIC-ADD ON - Abnormal; Notable for the following:    Squamous Epithelial / LPF FEW (*)    Bacteria, UA FEW (*)    All other components within normal limits  I-STAT TROPOININ, ED - Abnormal; Notable for the following:    Troponin i, poc 0.09 (*)    All other components within normal limits  CBG MONITORING, ED - Abnormal; Notable for the following:    Glucose-Capillary 125 (*)    All other components within normal limits  I-STAT CHEM 8, ED - Abnormal; Notable for the following:    BUN 23 (*)    Creatinine, Ser 1.50 (*)    Glucose, Bld 116 (*)    Hemoglobin 15.6 (*)    All other components within normal limits  URINE CULTURE  PROTIME-INR  APTT  CBC  DIFFERENTIAL  ETHANOL    Imaging Review Ct Head Wo Contrast  02/27/2015  CLINICAL DATA:  Left-sided  weakness, slurred speech, and facial droopupon awakening. EXAM: CT HEAD WITHOUT CONTRAST TECHNIQUE: Contiguous axial images were obtained from the base of the skull through the vertex without intravenous contrast. COMPARISON:  01/03/2015 FINDINGS: Skull and Sinuses:Negative for fracture or destructive process. The mastoids, middle ears, and imaged paranasal sinuses are clear. Orbits: No acute abnormality. Brain: No evidence of acute infarction, hemorrhage, hydrocephalus, or shift. 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 IMPRESSION: Negative head CT. Electronically Signed   By: Marnee Spring M.D.   On: 02/27/2015 11:01   Dg Chest Port 1 View  02/27/2015  CLINICAL DATA:  Shortness of breath. Stroke symptoms with left-sided weakness. EXAM: PORTABLE CHEST 1 VIEW COMPARISON:  01/03/2015 FINDINGS: The cardiac silhouette is upper limits of normal to mildly enlarged in size, unchanged. The patient has taken a  shallower inspiration than on the prior study with minimal left basilar atelectasis noted. No pulmonary edema, pleural effusion, or pneumothorax is identified. No acute osseous abnormality is seen. IMPRESSION: Hypoinflation without evidence of acute cardiopulmonary disease. Electronically Signed   By: Sebastian Ache M.D.   On: 02/27/2015 10:38   I have personally reviewed and evaluated these images and lab results as part of my medical decision-making.   EKG Interpretation   Date/Time:  Wednesday February 27 2015 09:58:56 EDT Ventricular Rate:  78 PR Interval:  163 QRS Duration: 87 QT Interval:  381 QTC Calculation: 434 R Axis:   86 Text Interpretation:  Sinus rhythm Consider left ventricular hypertrophy  No significant change since last tracing Confirmed by Ardys Hataway  MD,  Tranell Wojtkiewicz [phone]) on 02/27/2015 10:12:06 AM      MDM   Final diagnoses:  Acute encephalopathy  UTI (lower urinary tract infection)    Brought to emergency department by ambulance from shelter. Patient  reportedly woke up this morning with increased left-sided weakness. Patient is somnolent and sedated, does not answer questions appropriately and there is difficulty in getting history. She was admitted to the hospital 2 months ago with similar picture, had an MRI that showed possible CVA but left AGAINST MEDICAL ADVICE. Patient exhibits poor effort with examination but does have decreased strength in her left extremities and slurred speech. Will repeat MRI. Admit to unassigned service and Dr. Roseanne Reno, neurology will consult.    Gilda Crease, MD 02/27/15 1344  Gilda Crease, MD 02/27/15 1344

## 2015-02-27 NOTE — Progress Notes (Signed)
Pt more awake and alert, speech evaluation repeated and pt passed.  MD notified and diet order placed.  Will continue to monitor.  Estanislado EmmsAshley Schwarz, RN

## 2015-02-28 ENCOUNTER — Ambulatory Visit (HOSPITAL_COMMUNITY): Payer: Medicaid Other

## 2015-02-28 ENCOUNTER — Inpatient Hospital Stay (HOSPITAL_COMMUNITY): Payer: Medicaid Other

## 2015-02-28 ENCOUNTER — Encounter (HOSPITAL_COMMUNITY): Payer: Self-pay | Admitting: Physician Assistant

## 2015-02-28 DIAGNOSIS — F209 Schizophrenia, unspecified: Secondary | ICD-10-CM | POA: Insufficient documentation

## 2015-02-28 DIAGNOSIS — I6309 Cerebral infarction due to thrombosis of other precerebral artery: Secondary | ICD-10-CM

## 2015-02-28 DIAGNOSIS — G40909 Epilepsy, unspecified, not intractable, without status epilepticus: Secondary | ICD-10-CM

## 2015-02-28 DIAGNOSIS — I63133 Cerebral infarction due to embolism of bilateral carotid arteries: Secondary | ICD-10-CM | POA: Insufficient documentation

## 2015-02-28 DIAGNOSIS — F319 Bipolar disorder, unspecified: Secondary | ICD-10-CM

## 2015-02-28 DIAGNOSIS — I6330 Cerebral infarction due to thrombosis of unspecified cerebral artery: Secondary | ICD-10-CM

## 2015-02-28 DIAGNOSIS — I639 Cerebral infarction, unspecified: Secondary | ICD-10-CM

## 2015-02-28 DIAGNOSIS — G9340 Encephalopathy, unspecified: Secondary | ICD-10-CM

## 2015-02-28 DIAGNOSIS — R7989 Other specified abnormal findings of blood chemistry: Secondary | ICD-10-CM

## 2015-02-28 DIAGNOSIS — I6789 Other cerebrovascular disease: Secondary | ICD-10-CM

## 2015-02-28 LAB — GLUCOSE, CAPILLARY
GLUCOSE-CAPILLARY: 120 mg/dL — AB (ref 65–99)
GLUCOSE-CAPILLARY: 124 mg/dL — AB (ref 65–99)
GLUCOSE-CAPILLARY: 150 mg/dL — AB (ref 65–99)
Glucose-Capillary: 87 mg/dL (ref 65–99)

## 2015-02-28 LAB — BETA-2-GLYCOPROTEIN I ABS, IGG/M/A
Beta-2 Glyco I IgG: <9 GPI IgG units (ref 0–20)
Beta-2-Glycoprotein I IgA: <9 GPI IgA units (ref 0–25)
Beta-2-Glycoprotein I IgM: 11 GPI IgM units (ref 0–32)

## 2015-02-28 LAB — BASIC METABOLIC PANEL
Anion gap: 5 (ref 5–15)
BUN: 16 mg/dL (ref 6–20)
CALCIUM: 8.6 mg/dL — AB (ref 8.9–10.3)
CO2: 25 mmol/L (ref 22–32)
CREATININE: 1.29 mg/dL — AB (ref 0.44–1.00)
Chloride: 105 mmol/L (ref 101–111)
GFR calc non Af Amer: 53 mL/min — ABNORMAL LOW (ref >60)
Glucose, Bld: 102 mg/dL — ABNORMAL HIGH (ref 65–99)
Potassium: 3.6 mmol/L (ref 3.5–5.1)
SODIUM: 135 mmol/L (ref 135–145)

## 2015-02-28 LAB — LIPID PANEL
CHOL/HDL RATIO: 4.4 ratio
Cholesterol: 177 mg/dL (ref 0–200)
HDL: 40 mg/dL — AB (ref >40)
LDL CALC: 106 mg/dL — AB (ref 0–99)
TRIGLYCERIDES: 156 mg/dL — AB (ref <150)
VLDL: 31 mg/dL (ref 0–40)

## 2015-02-28 LAB — URINE CULTURE

## 2015-02-28 LAB — HOMOCYSTEINE: HOMOCYSTEINE-NORM: 19.6 umol/L — AB (ref 0.0–15.0)

## 2015-02-28 LAB — TROPONIN I
Troponin I: 0.12 ng/mL — ABNORMAL HIGH (ref <0.031)
Troponin I: 0.11 ng/mL — ABNORMAL HIGH (ref <0.031)

## 2015-02-28 MED ORDER — ATORVASTATIN CALCIUM 40 MG PO TABS
40.0000 mg | ORAL_TABLET | Freq: Every day | ORAL | Status: DC
  Administered 2015-02-28 – 2015-03-04 (×5): 40 mg via ORAL
  Filled 2015-02-28 (×5): qty 1

## 2015-02-28 MED ORDER — GABAPENTIN 400 MG PO CAPS
400.0000 mg | ORAL_CAPSULE | Freq: Three times a day (TID) | ORAL | Status: DC
  Administered 2015-02-28 – 2015-03-04 (×12): 400 mg via ORAL
  Filled 2015-02-28 (×12): qty 1

## 2015-02-28 NOTE — Progress Notes (Signed)
Pt refused EEG; will attempt tomorrow AM.

## 2015-02-28 NOTE — Progress Notes (Signed)
STROKE TEAM PROGRESS NOTE   HISTORY Tanya BerlinLatonya Mayer is an 34 y.o. female who lives in a homeless shelter. She was last seen in the hospital this September for left sided weakness and found to have 2 punctate strokes in right hemisphere. She was admitted but left AMA. She returns today stating she has left sided weakness. On exam she is a poor historian, will speak clearly to start but then mumbles her words but again will speak clearly at the end of her sentence. She perseverates on the fact she has had multiple strokes. She is stating she cannot move her left side. MRI of her brain showed bilateral frontal cortical acute ischemic infarctions. She was last known well 02/26/2015 at 21:30. Patient was not administered TPA secondary to out of window. She was admitted for further evaluation and treatment.   SUBJECTIVE (INTERVAL HISTORY) No family is at the bedside.  Overall she feels her condition is stable. Patient reports she has a blood clot in the foot. She reports recent hx of stroke.   OBJECTIVE Temp:  [97.8 F (36.6 C)-98.2 F (36.8 C)] 98.2 F (36.8 C) (10/20 0500) Pulse Rate:  [73-93] 73 (10/20 0500) Cardiac Rhythm:  [-] Normal sinus rhythm (10/20 0700) Resp:  [13-18] 16 (10/20 0500) BP: (91-120)/(51-81) 94/51 mmHg (10/20 0500) SpO2:  [10 %-100 %] 10 % (10/20 0500)  CBC:   Recent Labs Lab 02/27/15 1014 02/27/15 1021  WBC 7.4  --   NEUTROABS 5.9  --   HGB 14.1 15.6*  HCT 43.0 46.0  MCV 86.7  --   PLT 177  --     Basic Metabolic Panel:   Recent Labs Lab 02/27/15 1014 02/27/15 1021 02/28/15 0520  NA 134* 137 135  K 4.3 4.2 3.6  CL 101 103 105  CO2 21*  --  25  GLUCOSE 114* 116* 102*  BUN 19 23* 16  CREATININE 1.50* 1.50* 1.29*  CALCIUM 9.5  --  8.6*    Lipid Panel:     Component Value Date/Time   CHOL 177 02/28/2015 0115   TRIG 156* 02/28/2015 0115   HDL 40* 02/28/2015 0115   CHOLHDL 4.4 02/28/2015 0115   VLDL 31 02/28/2015 0115   LDLCALC 106* 02/28/2015  0115   HgbA1c:  Lab Results  Component Value Date   HGBA1C 5.7* 01/04/2015   Urine Drug Screen:     Component Value Date/Time   LABOPIA NONE DETECTED 02/27/2015 1137   COCAINSCRNUR NONE DETECTED 02/27/2015 1137   LABBENZ NONE DETECTED 02/27/2015 1137   AMPHETMU NONE DETECTED 02/27/2015 1137   THCU POSITIVE* 02/27/2015 1137   LABBARB NONE DETECTED 02/27/2015 1137      IMAGING  Ct Head Wo Contrast 02/27/2015  Negative head CT.   Mr Brain Wo Contrast 02/27/2015   Acute infarct in the right frontal cortex 2.5 cm. Small acute infarct left frontal cortex. Findings are suggestive of emboli. Recommend evaluation for cardiac source of emboli.   Dg Chest Port 1 View 02/27/2015  Hypoinflation without evidence of acute cardiopulmonary disease.    PHYSICAL EXAM [name] currently not in distress. . Afebrile. Head is nontraumatic. Neck is supple without bruit.    Cardiac exam no murmur or gallop. Lungs are clear to auscultation. Distal pulses are well felt. Neurological Exam :  Awake alert oriented 3. Affect is flat with belle la indeference. Follows commands well. Speech is clear without slurred speech or dysarthria. Extraocular moments are full range without nystagmus. Visual acuity seems adequate. Fundi  were not visualized. Mild left lower facial weakness. Tongue midline. Motor system exam good effort on the right with normal strength. Poor effort on the left side and will not hold left upper extremity up against gravity but when distracted she could do that. Subjective diminished touch and pinprick sensation on the left side including the face and forehead and splitting of the midline. Splitting of the forehead to vibration Chin as well. Reflexes are symmetric. Plantars are downgoing. Gait was not tested. ASSESSMENT/PLAN Tanya Mayer is a 34 y.o. female with history of clotting disorder, DVT, hypertension, DM-2, Epilepsy, Bipolar disorder, Schizophrenia,  depression, CVA in the past x 2 09/2014, 04/2014 per pt., ?IVC filter, Congenital heart defect s/p valve repair?  presenting with slurred speech and left sided weakness. She did not receive IV t-PA due to delay in arrival.   Stroke:  Right frontal cortex and small left frontal cortex infarct. Appears embolic secondary to unknown source - ? Hypercoagulable given reported VTE, recent stroke  MRI  Right frontal cortex infarct. Small left frontal cortex.  MRA  pending  Carotid Doppler  pending   2D Echo  pending   Lower extremity venous Dopplers pending  Hypercoagulable panel pending   If lower extremity Doppler negative, recommend TEE to look at heart  EEG pending   LDL 106  HgbA1c 5.7 in Aug  Lovenox 40 mg sq daily for VTE prophylaxis Diet heart healthy/carb modified Room service appropriate?: Yes; Fluid consistency:: Thin  clopidogrel 75 mg daily prior to admission, now on clopidogrel 75 mg daily  Ongoing aggressive stroke risk factor management  Therapy recommendations:  pending   Disposition:  pending   Hypertension  Normotensive  On hydrochlorothiazide at home  Hyperlipidemia  Home meds:  No statin  LDL 106, goal < 70  Added Lipitor 40 mg  Continue statin at discharge  Diabetes  HgbA1c 5.6 in August, at goal < 7.0  Controlled  Other Stroke Risk Factors  Cigarette smoker, advised to stop smoking  Marijuana use, urine drug screen positive this admission  Obesity, Body mass index is 30.66 kg/(m^2).   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  History of schizophrenia  Mildly elevated creatinine  Elevated troponin  Lives at the [facility] shelter with her husband  Hospital day # 1  [facility] See Amion  for Pager information 02/28/2015 4:32 PM  I have personally examined this patient, reviewed notes, independently viewed imaging studies, participated in medical decision making and plan of care. I have made any additions or clarifications directly to the above note. Agree with note above. She presented with speech difficulty and left-sided weakness due to bi-cerebral embolic infarcts as well as to be determined. She has prior history of right hemispheric infarcts with left-sided weakness as well. She remains at risk for neurological worsening, recurrent stroke, TIA needs ongoing stroke evaluation and aggressive risk factor modification. She will need cardiac workup for   including TTE,TEE, hypercoagulable panel, vasculitic labs. Discussed with internal medicine teaching service resident Delia Heady, MD Medical Director [facility] Pager: [phone] 02/28/2015 5:09 PM    To contact Stroke Continuity provider, please refer to [email or web address]. After hours, contact General Neurology

## 2015-02-28 NOTE — Evaluation (Signed)
Clinical/Bedside Swallow Evaluation Patient Details  Name: Tanya Mayer MRN: [id number] Date of Birth: 05/26/1980  Today's Date: 02/28/2015 Time: SLP Start Time (ACUTE ONLY): 0845 SLP Stop Time (ACUTE ONLY): 0900 SLP Time Calculation (min) (ACUTE ONLY): 15 min  Past Medical History:  Past Medical History  Diagnosis Date  . Diabetes mellitus without complication (HCC)   . Hypertension   . Stroke [facility](HCC)     hx of- left sided deficits   Past Surgical History:  Past Surgical History  Procedure Laterality Date  . Heart valve repair as a baby     HPI:  Tanya Mayer is a 34 y.o. female presenting with slurred speech and left sided weakness . PMH is significant for clotting disorder, DVT, hypertension, DM-2, Epilepsy, Bipolar disorder, Schizophrenia, depression, CVA in the past x 2 ( 09/2014, 04/2014 per pt.), ?IVC filter, Congenital heart defect s/p valve repair? Per pt. MRI brain with acute infarct in the right frontal cortex 2.5 cm, small acute infarct left frontal cortex suggestive of emboli.   Assessment / Plan / Recommendation Clinical Impression  Patient presents with a functional oropharyngeal swallow without overt indication of aspiration. No f/u for swallow indicated.     Aspiration Risk  Mild    Diet Recommendation Age appropriate regular solids;Thin   Medication Administration: Whole meds with liquid Compensations: Small sips/bites    Other  Recommendations Oral Care Recommendations: Oral care BID              Swallow Study    General Other Pertinent Information: Tanya Mayer is a 34 y.o. female presenting with slurred speech and left sided weakness . PMH is significant for clotting disorder, DVT, hypertension, DM-2, Epilepsy, Bipolar disorder, Schizophrenia, depression, CVA in the past x 2 ( 09/2014, 04/2014 per pt.), ?IVC filter, Congenital heart defect s/p valve repair? Per pt. MRI brain with acute infarct in the right frontal cortex 2.5 cm, small acute infarct  left frontal cortex suggestive of emboli. Type of Study: Bedside swallow evaluation Previous Swallow Assessment: bedside evaluation complete 8/26 indicated normal swallowing function Diet Prior to this Study: NPO Temperature Spikes Noted: No Respiratory Status: Room air History of Recent Intubation: No Behavior/Cognition: Alert;Other (Comment) (flat affect) Oral Cavity - Dentition: Adequate natural dentition/normal for age Self-Feeding Abilities: Able to feed self Patient Positioning: Upright in bed Baseline Vocal Quality: Normal Volitional Cough: Strong Volitional Swallow: Able to elicit    Oral/Motor/Sensory Function Overall Oral Motor/Sensory Function: Impaired Labial ROM: Reduced left Labial Symmetry: Abnormal symmetry left Labial Strength: Reduced Labial Sensation: Reduced Lingual ROM: Within Functional Limits Lingual Symmetry: Within Functional Limits Lingual Strength: Within Functional Limits Lingual Sensation: Within Functional Limits Facial ROM: Reduced left Facial Sensation: Reduced Velum: Within Functional Limits Mandible: Impaired (?-appearing with decreased opening however normal per pt)   Ice Chips Ice chips: Not tested   Thin Liquid Thin Liquid: Within functional limits Presentation: Cup;Self Fed;Straw    Nectar Thick Nectar Thick Liquid: Not tested   Honey Thick Honey Thick Liquid: Not tested   Puree Puree: Not tested   Solid   Tanya Mayer   Tanya Holloman MA, CCC-SLP [phone]  Solid: Within functional limits Presentation: Self Fed       Tanya Mayer 02/28/2015,10:31 AM

## 2015-02-28 NOTE — Evaluation (Signed)
Speech Language Pathology Evaluation Patient Details Name: Tanya Mayer MRN: [id number] DOB: August 09, 1980 Today's Date: 02/28/2015 Time: 8657-8469 SLP Time Calculation (min) (ACUTE ONLY): 17 min  Problem List:  Patient Active Problem List   Diagnosis Date Noted  . Acute encephalopathy 02/27/2015  . Stroke (HCC) 01/03/2015  . Essential hypertension 01/03/2015  . DM (diabetes mellitus) type II controlled, neurological manifestation (HCC) 01/03/2015  . Hyperlipidemia 01/03/2015  . Hemiparesis affecting left side as late effect of cerebrovascular accident (HCC) 01/03/2015  . Tobacco abuse 01/03/2015  . Marijuana abuse 01/03/2015  . Asthma 01/03/2015  . History of DVT of lower extremity 01/03/2015   Past Medical History:  Past Medical History  Diagnosis Date  . Diabetes mellitus without complication (HCC)   . Hypertension   . Stroke [facility])     hx of- left sided deficits   Past Surgical History:  Past Surgical History  Procedure Laterality Date  . Heart valve repair as a baby     HPI:  Tanya Mayer is a 34 y.o. female presenting with slurred speech and left sided weakness . PMH is significant for clotting disorder, DVT, hypertension, DM-2, Epilepsy, Bipolar disorder, Schizophrenia, depression, CVA in the past x 2 ( 09/2014, 04/2014 per pt.), ?IVC filter, Congenital heart defect s/p valve repair? Per pt. MRI brain with acute infarct in the right frontal cortex 2.5 cm, small acute infarct left frontal cortex suggestive of emboli.   Assessment / Plan / Recommendation Clinical Impression  Cognitive-linguistic evaluation complete. 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 In additon, patient with impairements in the areas of short term recall of information (long term memory appears intact), awareness, sustained attention, and abstract thinking. Difficult to determine baseline vs new  deficits at this time. Patient will benefit from SLP f/u to address deficits above.     SLP Assessment  Patient needs continued Speech Lanaguage Pathology Services    Follow Up Recommendations  Inpatient Rehab    Frequency and Duration min 2x/week  2 weeks   Pertinent Vitals/Pain Pain Assessment: No/denies pain   SLP Goals  Potential to Achieve Goals (ACUTE ONLY): Good Potential Considerations (ACUTE ONLY): Previous level of function  SLP Evaluation Prior Functioning  Cognitive/Linguistic Baseline: Information not available (h/o multiple CVAs this year. )  Lives With: Spouse   Cognition  Overall Cognitive Status: Impaired/Different from baseline Arousal/Alertness: Awake/alert Orientation Level: Oriented to place;Oriented to person;Oriented to situation Attention: Sustained Sustained Attention: Impaired Sustained Attention Impairment: Verbal complex Memory: Impaired Memory Impairment: Storage deficit (long term memory appears intact) Awareness: Impaired Awareness Impairment: Intellectual impairment Problem Solving: Appears intact Executive Function:  (abstract thinking impaired) Behaviors: Poor frustration tolerance Safety/Judgment: Appears intact    Comprehension  Auditory Comprehension Overall Auditory Comprehension: Appears within functional limits for tasks assessed Visual Recognition/Discrimination Discrimination: Within Function Limits Reading Comprehension Reading Status: Not tested    Expression Expression Primary Mode of Expression: Verbal Verbal Expression Overall Verbal Expression: Appears within functional limits for tasks assessed   Oral / Motor Oral Motor/Sensory Function Overall Oral Motor/Sensory Function: Impaired Labial ROM: Reduced left Labial Symmetry: Abnormal symmetry left Labial Strength: Reduced Labial Sensation: Reduced Lingual ROM: Within Functional Limits Lingual Symmetry: Within Functional Limits Lingual Strength: Within Functional  Limits Lingual Sensation: Within Functional Limits Facial ROM: Reduced left Facial Sensation: Reduced Velum: Within Functional Limits Mandible: Impaired (?-appearing with decreased opening however normal per pt) Motor Speech Overall Motor Speech: Impaired Respiration: Within functional  limits Phonation: Normal Resonance: Within functional limits Articulation: Impaired Level of Impairment: Word Intelligibility: Intelligibility reduced Word: 50-74% accurate Phrase: 50-74% accurate Sentence: 50-74% accurate Conversation: 50-74% accurate Motor Planning:  (TBD)   GO    Tanya LangoLeah Ellianah Cordy MA, CCC-SLP [phone]  Tanya Mayer Tanya Mayer 02/28/2015, 10:53 AM

## 2015-02-28 NOTE — Progress Notes (Signed)
PT Cancellation Note  Patient Details Name: Jackquline BerlinLatonya Mordecai MRN: [id number] DOB: 05/20/1980   Cancelled Treatment:    Reason Eval/Treat Not Completed: Fatigue/lethargy limiting ability to participate   Patient asleep with phone to her ear on arrival. When awakened she quickly became upset. Explained to pt the role of PT and offered to assess her mobility. She struggled to keep her eyes open and stated she just was too tired and could not do it now. She asked if PT would return tomorrow.   Annice Jolly 02/28/2015, 4:42 PM  Pager [phone]

## 2015-02-28 NOTE — Progress Notes (Signed)
PT Cancellation Note  Patient Details Name: Tanya Mayer MRN: [id number] DOB: 03/14/1981   Cancelled Treatment:    Reason Eval/Treat Not Completed: Patient not medically ready. Strict bedrest order entered by MD after PT eval ordered (and after up with assistance order entered).   Will follow and await activity order and medical appropriateness for PT eval.   Tanya Mayer 02/28/2015, 8:20 AM  Pager [phone]

## 2015-02-28 NOTE — Consult Note (Signed)
CARDIOLOGY CONSULT NOTE   Patient ID: Tanya Mayer MRN: [id number], DOB/AGE: 12-23-1980   Admit date: 02/27/2015 Date of Consult: 02/28/2015   Primary Physician: No primary care provider on file. Primary Cardiologist: new  Pt. 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 Diabetes mellitus without complication (HCC)   . Hypertension   . Stroke [facility])     hx of- left sided deficits    Past Surgical History  Procedure Laterality Date  . Heart valve repair as a baby       Allergies  Allergies  Allergen Reactions  . Carrot [Daucus Carota] Anaphylaxis  . Eggs Or Egg-Derived Products Anaphylaxis and Swelling  . 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 According to the patient, she was born at [facility], shortly after she had a valvular repair. She could not give me any further information beyond that. She also had history of DVT at unknown age and later had IVC filter placed near the end of 2015 at [facility] in [place]. Since last year, she has a total of 3 strokes (04/2014, 12/2014 and now). She has moved from [place] to [place] in August. She was admitted at [facility] in August 2016 with CVA and was later discharged on Plavix but not on aspirin. MRI at that time showed 2 punctate left frontal white matter infarct. She states she has been compliant with Plavix therapy. She states she has been having  some left-sided weakness since her last stroke in August.  She presented to [facility] on 02/27/2015 after her girlfriend and husband noted slurring of speech, increasing left-sided weakness. Initial CT of the head was negative. MRI however showed acute infarct of right frontal cortex 2.5 cm, along with small acute infarct of left frontal cortex, findings consistent with emboli. According to the patient, 2 days ago, she did have some chest discomfort and palpitation feeling, she felt her heart was racing. She denies any prior diagnosis of atrial flutter or fibrillation. Urinalysis was positive for trichomonas. Urine toxicity profile was positive for marijuana. During this admission, she was noted to have mildly elevated troponin which peaked at 0.12. Cardiology has been consulted for elevated troponin.   Inpatient Medications  . atorvastatin  40 mg Oral q1800  . clopidogrel  75 mg Oral Daily  . enoxaparin (LOVENOX) injection  40 mg Subcutaneous Q24H  . gabapentin  400 mg Oral TID  . QUEtiapine  300 mg Oral QHS  . risperiDONE  0.25 mg Oral BID    Family History Family History  Problem Relation Age of Onset  . Hypertension Mother      Social History Social History   Social History  . Marital Status: Married    Spouse Name: N/A  . Number of Children: N/A  . Years of Education: N/A   Occupational History  . Not on file.   Social History Main Topics  . Smoking status: Current Every Day Smoker -- 1.00 packs/day for 18 years  . Smokeless tobacco:  Not on file  . Alcohol Use: No  . Drug Use: Yes    Special: Marijuana  . Sexual Activity: Not on file   Other Topics Concern  . Not on file   Social History Narrative     Review of Systems  General:  No chills, fever, night sweats or weight changes.  Cardiovascular:  No dyspnea on exertion, edema, orthopnea, paroxysmal nocturnal dyspnea. +chest pain, palpitations Dermatological: No rash, lesions/masses Respiratory: No  cough, dyspnea Urologic: No hematuria, dysuria Abdominal:   No nausea, vomiting, diarrhea, bright red blood per rectum, melena, or hematemesis Neurologic:  +LUE, LLE weakness. Slurring of speech All other systems reviewed and are otherwise negative except as noted above.  Physical Exam  Blood pressure 94/51, pulse 73, temperature 98.2 F (36.8 C), temperature source Oral, resp. rate 16, height 5' (1.524 m), weight 157 lb (71.215 kg), last menstrual period 02/20/2015, SpO2 10 %.  General: Pleasant, NAD Psych: Normal affect. Neuro: Alert and oriented X 3. Does not seems to be able to move LUE and LLE HEENT: Normal  Neck: Supple without bruits or JVD. Lungs:  Resp regular and unlabored, CTA. Heart: RRR no s3, s4, or murmurs. Abdomen: Soft, non-tender, non-distended, BS + x 4.  Extremities: No clubbing, cyanosis or edema. DP/PT/Radials 2+ and equal bilaterally.  Labs   Recent Labs  02/28/15 0010 02/28/15 0520  TROPONINI 0.12* 0.11*   Lab Results  Component Value Date   WBC 7.4 02/27/2015   HGB 15.6* 02/27/2015   HCT 46.0 02/27/2015   MCV 86.7 02/27/2015   PLT 177 02/27/2015    Recent Labs Lab 02/27/15 1014  02/28/15 0520  NA 134*  < > 135  K 4.3  < > 3.6  CL 101  < > 105  CO2 21*  --  25  BUN 19  < > 16  CREATININE 1.50*  < > 1.29*  CALCIUM 9.5  --  8.6*  PROT 7.5  --   --   BILITOT 0.3  --   --   ALKPHOS 87  --   --   ALT 17  --   --   AST 26  --   --   GLUCOSE 114*  < > 102*  < > = values in this interval not displayed. Lab Results  Component Value Date   CHOL 177 02/28/2015   HDL 40* 02/28/2015   LDLCALC 106* 02/28/2015   TRIG 156* 02/28/2015   No results found for: DDIMER  Radiology/Studies  Ct Head Wo Contrast  02/27/2015  CLINICAL DATA:  Left-sided weakness, slurred speech, and facial droopupon awakening. EXAM: CT HEAD WITHOUT CONTRAST TECHNIQUE: Contiguous axial images were obtained from the base of the skull through the vertex without  intravenous contrast. COMPARISON:  01/03/2015 FINDINGS: Skull and Sinuses:Negative for fracture or destructive process. The mastoids, middle ears, and imaged paranasal sinuses are clear. Orbits: No acute abnormality. Brain: No evidence of acute infarction, hemorrhage, hydrocephalus, or shift. 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 IMPRESSION: Negative head CT. Electronically Signed   By: Marnee Spring M.D.   On: 02/27/2015 11:01   Mr Brain Wo Contrast  02/27/2015  CLINICAL DATA:  Left hemiparesis.  Mental status change. EXAM: MRI HEAD WITHOUT CONTRAST TECHNIQUE: Multiplanar, multiecho pulse sequences of the brain and surrounding structures were obtained without intravenous contrast. COMPARISON:  CT head 02/27/2015.  MRI 01/03/2015 FINDINGS: Acute infarct right frontal cortex . This measures 2.5 cm. Small acute infarct left frontal cortex. No associated hemorrhage  Ventricle size normal.  Cerebral volume normal. Cerebral white matter normal without evidence of chronic ischemia. Negative for demyelinating disease. Brainstem and cerebellum and basal ganglia normal. Negative for mass or edema. Pituitary normal in size.  Orbits normal.  Paranasal sinuses clear. IMPRESSION: Acute infarct in the right frontal cortex 2.5 cm. Small acute infarct left frontal cortex. Findings are suggestive of emboli. Recommend evaluation for cardiac source of emboli. Electronically Signed   By: Marlan Palau M.D.   On: 02/27/2015 15:59   Dg Chest Port 1 View  02/27/2015  CLINICAL DATA:  Shortness of breath. Stroke symptoms with left-sided weakness. EXAM: PORTABLE CHEST 1 VIEW COMPARISON:  01/03/2015 FINDINGS: The cardiac silhouette is upper limits of normal to mildly enlarged in size, unchanged. The patient has taken a shallower inspiration than on the prior study with minimal left basilar atelectasis noted. No pulmonary edema, pleural effusion, or pneumothorax is identified. No acute osseous abnormality is seen.  IMPRESSION: Hypoinflation without evidence of acute cardiopulmonary disease. Electronically Signed   By: Sebastian Ache M.D.   On: 02/27/2015 10:38   Dg Abd Portable 1v  02/28/2015  CLINICAL DATA:  Evaluate for presence of inferior vena cava filter EXAM: PORTABLE ABDOMEN - 1 VIEW COMPARISON:  None. FINDINGS: No inferior vena cava filter is identified on this study. There is a nonobstructive bowel gas pattern. IMPRESSION: No IVC filter identified Electronically Signed   By: Esperanza Heir M.D.   On: 02/28/2015 12:47    ECG  NSR with early repol in anterior leads  ASSESSMENT AND PLAN  1. Elevated trop   - does have recent CP, unclear if related to stroke, embolic event to coronary vessel vs stress  - will followup with echo first, if EF normal, will consider myoview.   2. Recurrent CVA concerning for emboli  - MRI however showed acute infarct of right frontal cortex 2.5 cm, along with small acute infarct of left frontal cortex, findings consistent with emboli.  - will need TEE, will schedule for tomorrow, NPO past midnight. Likely need LE venous doppler if PFO is suspected  - will likely need to transition from plavix with coumadin vs NOAC given recurrent stroke on plavix alone, however would assess with TEE first  3. Palpitation   - given recurrent CVA, will need event monitor vs loop recorder to r/o afib and aflutter, she maybe at higher risk for arrhythmia depend on the type of congenital heart disorder she had   4. Hypercoagulable disorder s/p IVC filter in 2015 at [facility] in [place]  - hypercoagulable panel per IM  5. H/o congenital heart defect s/p repair at [facility]  - will followup with echo result with comment on morphology  - will need record from OSH   6. HLD: Chol 177, LDL 106, HDL 40, trig 156  7. DM II  8. Trichomonas: per IM  9. AKI: Cr improving  10. Epilepsy/Schizophrenia/Depression/Bipolar  Signed, Azalee Course, PA-C 02/28/2015,  2:18 PM   I have personally seen and examined this patient with Azalee Course, PA-C. I agree with the assessment and plan as outlined above. She has a complex history including multiple CVA, congenital heart disease with cardiac surgery as newborn and hypercoagulable state. She is admitted with an acute CVA. This appears to be embolic. She is a poor historian. It is not clear why she is not on anti-coagulation. An IVC filter was placed last year in [place] suggesting she did not tolerate anticoagulation but she denies  this. She states that she was never placed on anti-coagulation. She had been treated with Plavix. Her troponin shows subtle elevation. She does endorse one episode of mild chest pain before admission. Exam with normal appearing, oriented female. RRR with no loud murmurs. Lungs are clear. No LE edema. Cannot exclude coronary embolization but her EKG and clinical picture does not support this.   Echo is being performed now to assess LVEF, wall motion. Plans for TEE tomorrow to better define her anatomy and exclude intracardiac thrombus. She denies having had a TEE in the past. We are not clear on the type of cardiac surgery that she underwent. Will try to get records from [place] if possible. If it has not already been done in [place], will need hypercoagulable workup. We will follow with you.l   Noreta Kue 02/28/2015 3:05 PM

## 2015-02-28 NOTE — Care Management Note (Signed)
Case Management Note  Patient Details  Name: Tanya Mayer MRN: [id number] Date of Birth: 01/07/1981  Subjective/Objective:                    Action/Plan: Patient admitted with CVA. Patient is currently staying at [facility] with her husband. No PCP listed but patient has Medicaid. CM will follow for discharge needs and PCP.   Expected Discharge Date:                  Expected Discharge Plan:  Homeless Shelter  In-House Referral:     Discharge planning Services     Post Acute Care Choice:    Choice offered to:     DME Arranged:    DME Agency:     HH Arranged:    HH Agency:     Status of Service:  In process, will continue to follow  Medicare Important Message Given:    Date Medicare IM Given:    Medicare IM give by:    Date Additional Medicare IM Given:    Additional Medicare Important Message give by:     If discussed at Long Length of Stay Meetings, dates discussed:    Additional Comments:  Kermit BaloKelli F Aynsley Fleet, RN 02/28/2015, 10:06 AM

## 2015-02-28 NOTE — Progress Notes (Signed)
Family Medicine Teaching Service Daily Progress Note Intern Pager: [phone]  Patient name: Tanya Mayer Medical record number: [id number] Date of birth: 09/14/1980 Age: 34 y.o. Gender: female  Primary Care Provider: No primary care provider on file. Consultants: neurology, cardiology Code Status: full  Pt Overview and Major Events to Date:  10/19-admitted with stroke  Assessment and Plan: Tanya BerlinLatonya Mayer is a 34 y.o. female presenting with slurred speech and left sided weakness . PMH is significant for clotting disorder, DVT, hypertension, DM-2, Epilepsy, Bipolar disorder, Schizophrenia, depression, CVA in the past x 2 ( 09/2014, 04/2014 per pt.), ?IVC filter, Congenital heart defect s/p valve repair? Per pt.   Hemiplegia and slurred speech: ischemic stroke. CT head negative. MRI brain with acute infarct in the right frontal cortex 2.5 cm, small acute infarct left frontal cortex suggestive of emboli. EKG with sinus rhythm. Three CVA's in the past one year Apparently treated in [place] per pt. Hx of hypercoagulability, DVT, and IVC filter placement per pt. She is on plavix and ASA prescribed by her physicians in [place]. Additionally she says that she had a "valve repair" as a baby, but does not know if she has a PFO. Exam remarkable for left sided decreased sensation. Motor 3/5 in left UE and LE. Down-going plantar reflex bilaterally. CN V and XI deficit on the left. Out of the tpa window as she woke up this am with symptoms.  -Admit to telemetry. Attending Dr. Gwendolyn GrantWalden - Neuro checks q 2hrs - Appreciate neuro recs: - Plavix 75 mg daily. Not sure if this is working for her given CVA while on it. - Hypercoagulablity labs: antithrombin-3 wnl, other labs pending - Will weigh in to anticoagulant based on hypercoagulability labs - Consider carotid doppler and MRA - Start Atorvastatin 40 mg daily - Echo with bubble study. - LE PVL - F/u records from outside hospital - had  bedside swallow eval. - PT/OT/SLP eval  Elevated troponin: initial troponin of 0.09>0.12>0.11. No complaint of chest pain. HDS. EKG with sinus rhythm and no sign of ischemic changes. -f/u card consult  Hypertension: normotensive on presenation. Hypotensive this morning to 94/451mmHg. On HCTZ at home - hold of her HCTZ now  DM-2: A1c 5.7 on 01/03/2015. On levemir 30 unit bid at home.Reports blood sugar of 360 yesterday. CBG 125 on presentation, 87 this AM.  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 B/l 1.2-1.3. 1.3 this morning. -IV NS at 12925ml/hr -will monitor BMP  Hx of DVT: Says she has IVC placed in 08/2014. Has hx of blood clot since she was young. Mother with hx of blood clot as well -Hypercoagulability panel  -Will get LE doppler  H/o Schizophrenia: on risperidone 0.25 mg and seroquel 300 mg at home at home. - resume home meds  H/o of Seizure: after she had stroke. Started on Keppra but pt. Didn't feel like it worked for her? Not on any meds now. - will discuss with neuro - supposed to be on keppra at home. Will load and restart here.   Asthma: stable now. On albuterol at home. Didn't use it within last week. Course sounds on lung exams -albuterol PRN  Social: [place] [place] [facility] [place] August. She lives in shelter at [facility] with her husband. Denies domestic violence. She says her records [facility] at [facility], and at [facility] in [place].   FEN/GI: heart healthy diet  Prophylaxis:  -Lovonex  Disposition: floor with tele pending  stroke & hypercoagulability work up. Subjective:  Reports that she still have left sideded weakness. Denies numbness and tingling other than her baseline neuropathy. Feels her speech has improved. Asks for regular diet instead of heart healthy. Reports some back pain but denies incontinence or numbness between her legs.  Objective: Temp:  [97.5 F (36.4 C)-98.2 F (36.8 C)] 98.2 F  (36.8 C) (10/20 0500) Pulse Rate:  [70-93] 73 (10/20 0500) Resp:  [12-18] 16 (10/20 0500) BP: (91-122)/(51-83) 94/51 mmHg (10/20 0500) SpO2:  [10 %-100 %] 10 % (10/20 0500) Weight:  [157 lb (71.215 kg)] 157 lb (71.215 kg) (10/19 0959) Physical Exam: Gen: more alert, answers question appropriately. Eyes: PERRLA, EOMI, sclera anicteric, no conjunctival injection Nares: clear, no erythema, swelling or congestion Oropharynx: clear, moist CV: RRR. S1 & S2 audible, no murmurs, rubs.. Resp: no apparent WOB, CTAB. Abd: +BS. Soft, NDNT, no rebound or guarding.  Ext: No edema or gross deformities. Neuro: slurred speech improved, answers questions appropriately, CN-V, VII & XI deficit on left side but improved from yesterday, motor 3/5 in LUE and LLE. Finger to nose normal on right. Not able assess on left. Plantar reflex-down going bilaterally. Knee reflex 1+ bilaterally. Laboratory:  Recent Labs Lab 02/27/15 1014 02/27/15 1021  WBC 7.4  --   HGB 14.1 15.6*  HCT 43.0 46.0  PLT 177  --     Recent Labs Lab 02/27/15 1014 02/27/15 1021 02/28/15 0520  NA 134* 137 135  K 4.3 4.2 3.6  CL 101 103 105  CO2 21*  --  25  BUN 19 23* 16  CREATININE 1.50* 1.50* 1.29*  CALCIUM 9.5  --  8.6*  PROT 7.5  --   --   BILITOT 0.3  --   --   ALKPHOS 87  --   --   ALT 17  --   --   AST 26  --   --   GLUCOSE 114* 116* 102*    Imaging/Diagnostic Tests: Ct Head Wo Contrast  02/27/2015  CLINICAL DATA:  Left-sided weakness, slurred speech, and facial droopupon awakening. EXAM: CT HEAD WITHOUT CONTRAST TECHNIQUE: Contiguous axial images were obtained from the base of the skull through the vertex without intravenous contrast. COMPARISON:  01/03/2015 FINDINGS: Skull and Sinuses:Negative for fracture or destructive process. The mastoids, middle ears, and imaged paranasal sinuses are clear. Orbits: No acute abnormality. Brain: No evidence of acute infarction, hemorrhage, hydrocephalus, or shift. Small and  incidental cavum velum interpositum cyst. IMPRESSION: Negative head CT. Electronically Signed   By: Marnee Spring M.D.   On: 02/27/2015 11:01   Mr Brain Wo Contrast  02/27/2015  CLINICAL DATA:  Left hemiparesis.  Mental status change. EXAM: MRI HEAD WITHOUT CONTRAST TECHNIQUE: Multiplanar, multiecho pulse sequences of the brain and surrounding structures were obtained without intravenous contrast. COMPARISON:  CT head 02/27/2015.  MRI 01/03/2015 FINDINGS: Acute infarct right frontal cortex . This measures 2.5 cm. Small acute infarct left frontal cortex. No associated hemorrhage Ventricle size normal.  Cerebral volume normal. Cerebral white matter normal without evidence of chronic ischemia. Negative for demyelinating disease. Brainstem and cerebellum and basal ganglia normal. Negative for mass or edema. Pituitary normal in size.  Orbits normal.  Paranasal sinuses clear. IMPRESSION: Acute infarct in the right frontal cortex 2.5 cm. Small acute infarct left frontal cortex. Findings are suggestive of emboli. Recommend evaluation for cardiac source of emboli. Electronically Signed   By: Marlan Palau M.D.   On: 02/27/2015 15:59   Dg Chest  Port 1 View  02/27/2015  CLINICAL DATA:  Shortness of breath. Stroke symptoms with left-sided weakness. EXAM: PORTABLE CHEST 1 VIEW COMPARISON:  01/03/2015 FINDINGS: The cardiac silhouette is upper limits of normal to mildly enlarged in size, unchanged. The patient has taken a shallower inspiration than on the prior study with minimal left basilar atelectasis noted. No pulmonary edema, pleural effusion, or pneumothorax is identified. No acute osseous abnormality is seen. IMPRESSION: Hypoinflation without evidence of acute cardiopulmonary disease. Electronically Signed   By: Sebastian Ache M.D.   On: 02/27/2015 10:38    Almon Hercules, MD 02/28/2015, 7:22 AM PGY-1, [facility] Family Medicine FPTS Intern pager: [phone], text pages welcome

## 2015-03-01 ENCOUNTER — Encounter (HOSPITAL_COMMUNITY)
Admission: EM | Disposition: A | Discharge status: Home / Self Care | Payer: Self-pay | Source: Home / Self Care | Attending: Family Medicine

## 2015-03-01 ENCOUNTER — Ambulatory Visit (HOSPITAL_COMMUNITY): Payer: Medicaid Other

## 2015-03-01 ENCOUNTER — Encounter (HOSPITAL_COMMUNITY): Payer: Self-pay | Admitting: *Deleted

## 2015-03-01 DIAGNOSIS — E1140 Type 2 diabetes mellitus with diabetic neuropathy, unspecified: Secondary | ICD-10-CM | POA: Insufficient documentation

## 2015-03-01 DIAGNOSIS — Z794 Long term (current) use of insulin: Secondary | ICD-10-CM

## 2015-03-01 DIAGNOSIS — Q211 Atrial septal defect: Secondary | ICD-10-CM

## 2015-03-01 DIAGNOSIS — I639 Cerebral infarction, unspecified: Secondary | ICD-10-CM

## 2015-03-01 DIAGNOSIS — I10 Essential (primary) hypertension: Secondary | ICD-10-CM

## 2015-03-01 DIAGNOSIS — Q2112 Patent foramen ovale: Secondary | ICD-10-CM | POA: Insufficient documentation

## 2015-03-01 HISTORY — PX: TEE WITHOUT CARDIOVERSION: SHX5443

## 2015-03-01 LAB — PROTEIN C ACTIVITY: Protein C Activity: 142 % (ref 74–151)

## 2015-03-01 LAB — HEMOGLOBIN A1C
HEMOGLOBIN A1C: 5.7 % — AB (ref 4.8–5.6)
MEAN PLASMA GLUCOSE: 117 mg/dL

## 2015-03-01 LAB — GLUCOSE, CAPILLARY
GLUCOSE-CAPILLARY: 88 mg/dL (ref 65–99)
GLUCOSE-CAPILLARY: 93 mg/dL (ref 65–99)
Glucose-Capillary: 103 mg/dL — ABNORMAL HIGH (ref 65–99)
Glucose-Capillary: 127 mg/dL — ABNORMAL HIGH (ref 65–99)

## 2015-03-01 LAB — LUPUS ANTICOAGULANT PANEL
DRVVT: 41.7 s (ref 0.0–55.1)
PTT Lupus Anticoagulant: 34.2 s (ref 0.0–50.0)

## 2015-03-01 LAB — PROTEIN S, TOTAL: PROTEIN S AG TOTAL: 123 % (ref 58–150)

## 2015-03-01 LAB — CARDIOLIPIN ANTIBODIES, IGG, IGM, IGA
Anticardiolipin IgA: <9 APL U/mL (ref 0–11)
Anticardiolipin IgM: <9 MPL U/mL (ref 0–12)

## 2015-03-01 LAB — PROTEIN C, TOTAL: Protein C, Total: 97 % (ref 70–140)

## 2015-03-01 LAB — PROTEIN S ACTIVITY: Protein S Activity: 81 % (ref 60–145)

## 2015-03-01 SURGERY — ECHOCARDIOGRAM, TRANSESOPHAGEAL
Anesthesia: Moderate Sedation

## 2015-03-01 MED ORDER — INSULIN ASPART 100 UNIT/ML ~~LOC~~ SOLN
0.0000 [IU] | Freq: Three times a day (TID) | SUBCUTANEOUS | Status: DC
Start: 2015-03-02 — End: 2015-03-04
  Administered 2015-03-02 – 2015-03-04 (×3): 1 [IU] via SUBCUTANEOUS

## 2015-03-01 MED ORDER — SODIUM CHLORIDE 0.9 % IV SOLN: INTRAVENOUS | Status: DC

## 2015-03-01 MED ORDER — MIDAZOLAM HCL 10 MG/2ML IJ SOLN
INTRAMUSCULAR | Status: DC | PRN
  Administered 2015-03-01: 2 mg via INTRAVENOUS
  Administered 2015-03-01: 1 mg via INTRAVENOUS
  Administered 2015-03-01: 2 mg via INTRAVENOUS

## 2015-03-01 MED ORDER — FENTANYL CITRATE (PF) 100 MCG/2ML IJ SOLN
INTRAMUSCULAR | Status: DC | PRN
  Administered 2015-03-01 (×2): 25 ug via INTRAVENOUS

## 2015-03-01 MED ORDER — INSULIN ASPART 100 UNIT/ML ~~LOC~~ SOLN: 0.0000 [IU] | Freq: Every day | SUBCUTANEOUS | Status: DC

## 2015-03-01 MED ORDER — BUTAMBEN-TETRACAINE-BENZOCAINE 2-2-14 % EX AERO
INHALATION_SPRAY | CUTANEOUS | Status: DC | PRN
Start: 2015-03-01 — End: 2015-03-01
  Administered 2015-03-01: 2 via TOPICAL

## 2015-03-01 MED ORDER — INSULIN ASPART 100 UNIT/ML ~~LOC~~ SOLN: 0.0000 [IU] | SUBCUTANEOUS | Status: DC

## 2015-03-01 MED ORDER — MIDAZOLAM HCL 5 MG/ML IJ SOLN
INTRAMUSCULAR | Status: AC
  Filled 2015-03-01: qty 2

## 2015-03-01 MED ORDER — DIPHENHYDRAMINE HCL 50 MG/ML IJ SOLN
INTRAMUSCULAR | Status: AC
  Filled 2015-03-01: qty 1

## 2015-03-01 MED ORDER — FENTANYL CITRATE (PF) 100 MCG/2ML IJ SOLN
INTRAMUSCULAR | Status: AC
  Filled 2015-03-01: qty 2

## 2015-03-01 MED ORDER — METRONIDAZOLE 500 MG PO TABS
500.0000 mg | ORAL_TABLET | Freq: Once | ORAL | Status: AC
  Administered 2015-03-01: 500 mg via ORAL
  Filled 2015-03-01: qty 1

## 2015-03-01 NOTE — Progress Notes (Signed)
STROKE TEAM PROGRESS NOTE   SUBJECTIVE (INTERVAL HISTORY) Patient lying in bed. Complaining about her low carb diet. Patient is concerned about her post-hospital living environment. Dr. Pearlean BrownieSethi addressed with her why she is refusing tests - encouraged her to let him do his job and figure out what is wrong with her.    OBJECTIVE Temp:  [97.4 F (36.3 C)-98.7 F (37.1 C)] 98 F (36.7 C) (10/21 1233) Pulse Rate:  [63-86] 74 (10/21 1245) Cardiac Rhythm:  [-] Normal sinus rhythm (10/21 0700) Resp:  [11-19] 18 (10/21 1245) BP: (96-166)/(53-121) 150/121 mmHg (10/21 1245) SpO2:  [95 %-100 %] 95 % (10/21 1245)  CBC:   Recent Labs Lab 02/27/15 1014 02/27/15 1021  WBC 7.4  --   NEUTROABS 5.9  --   HGB 14.1 15.6*  HCT 43.0 46.0  MCV 86.7  --   PLT 177  --     Basic Metabolic Panel:   Recent Labs Lab 02/27/15 1014 02/27/15 1021 02/28/15 0520  NA 134* 137 135  K 4.3 4.2 3.6  CL 101 103 105  CO2 21*  --  25  GLUCOSE 114* 116* 102*  BUN 19 23* 16  CREATININE 1.50* 1.50* 1.29*  CALCIUM 9.5  --  8.6*    Lipid Panel:     Component Value Date/Time   CHOL 177 02/28/2015 0115   TRIG 156* 02/28/2015 0115   HDL 40* 02/28/2015 0115   CHOLHDL 4.4 02/28/2015 0115   VLDL 31 02/28/2015 0115   LDLCALC 106* 02/28/2015 0115   HgbA1c:  Lab Results  Component Value Date   HGBA1C 5.7* 02/28/2015   Urine Drug Screen:     Component Value Date/Time   LABOPIA NONE DETECTED 02/27/2015 1137   COCAINSCRNUR NONE DETECTED 02/27/2015 1137   LABBENZ NONE DETECTED 02/27/2015 1137   AMPHETMU NONE DETECTED 02/27/2015 1137   THCU POSITIVE* 02/27/2015 1137   LABBARB NONE DETECTED 02/27/2015 1137      IMAGING  Ct Head Wo Contrast 02/27/2015  Negative head CT.   Mr Brain Wo Contrast 02/27/2015   Acute infarct in the right frontal cortex 2.5 cm. Small acute infarct left frontal cortex. Findings are suggestive of emboli. Recommend evaluation for cardiac source of emboli.   Dg Chest Port 1  View 02/27/2015  Hypoinflation without evidence of acute cardiopulmonary disease.   2D Echocardiogram   - Left ventricle: The cavity size was normal. Systolic function wasnormal. The estimated ejection fraction was in the range of 60% to 65%. Wall motion was normal; there were no regional wallmotion abnormalities. - Mitral valve: There was mild regurgitation.  MRA head Negative intracranial MRA.  LE doppler negative   PHYSICAL EXAM [name] currently not in distress. . Afebrile. Head is nontraumatic. Neck is supple without bruit.    Cardiac exam no murmur or gallop. Lungs are clear to auscultation. Distal pulses are well felt. Neurological Exam :  Awake alert oriented 3. Affect is flat with belle la indeference. Follows commands well. Speech is clear without slurred speech or dysarthria. Extraocular moments are full range without nystagmus. Visual acuity seems adequate. Fundi were not visualized. Mild left lower facial weakness. Tongue midline. Motor system exam good effort on the right with normal strength. Poor effort on the left side and will not hold left upper extremity up against gravity but when distracted she could do that. Subjective diminished touch and pinprick sensation on the left side including the face and forehead and splitting of the midline. Splitting of the  forehead to vibration Chin as well. Reflexes are symmetric. Plantars are downgoing. Gait was not tested.   ASSESSMENT/PLAN Ms. Antionette Luster is a 34 y.o. female with history of clotting disorder, DVT, hypertension, DM-2, Epilepsy, Bipolar disorder, Schizophrenia, depression, CVA in the past x 2 09/2014, 04/2014 per pt., ?IVC filter, Congenital heart defect s/p valve repair?  presenting with slurred speech and left sided weakness. She did not receive IV t-PA due to delay in arrival.   Stroke:  Right frontal cortex and small left frontal cortex infarct. Appears embolic secondary to unknown source -  Presence of a PFO presents a possible stroke etiology, though unlikely given negative LE doppler. Hypercoagulable panel pending.   MRI  Right frontal cortex infarct. Small left frontal cortex.  MRA  Unremarkable   Carotid Doppler  Refused, pending, they will keep trying   2D Echo  No source of embolus   Lower extremity venous Dopplers negative  Hypercoagulable panel some tests remain pending.  Homo elevated.   TEE + PFO, + ASD  EEG pending   LDL 106  HgbA1c 5.7 in Aug  Lovenox 40 mg sq daily for VTE prophylaxis Diet Heart Room service appropriate?: Yes; Fluid consistency:: Thin  clopidogrel 75 mg daily prior to admission, now on clopidogrel 75 mg daily. DO NOT RECOMMEND ANTICOAGULATION WITHOUT KNOWN EMBOLIC SOURCE  We do not recommend PFO/ASD closure as there is no clear association between its presence and stroke without known source.  Ongoing aggressive stroke risk factor management  Therapy recommendations:  pending   Disposition:  pending   Hypertension  Normotensive  On hydrochlorothiazide at home  Hyperlipidemia  Home meds:  No statin  LDL 106, goal < 70  Added Lipitor 40 mg  Continue statin at discharge  Diabetes  HgbA1c 5.6 in August, at goal < 7.0  Controlled  Other Stroke Risk Factors  Cigarette smoker, advised to stop smoking  Marijuana use, urine drug screen positive this admission  Obesity, Body mass index is 30.66 kg/(m^2).   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  History of schizophrenia  Mildly elevated creatinine  Elevated troponin  Lives at the [facility] shelter with her husband  Hospital day # 2  Rhoderick Moody [facility] See Amion for Pager information 03/01/2015 2:18 PM  I have  personally examined this patient, reviewed notes, independently viewed imaging studies, participated in medical decision making and plan of care. I have made any additions or clarifications directly to the above note. Agree with note above. Patient has a PFO and ASD but lower extremity venous Doppler is negative for DVT hence etiology of the stroke is yet indeterminate and not necessarily paradoxical embolism. Continue antiplatelets therapy  Delia Heady, MD Medical Director [facility] Pager: [phone] 03/01/2015 6:14 PM    To contact Stroke Continuity provider, please refer to [email or web address]. After hours, contact General Neurology

## 2015-03-01 NOTE — Progress Notes (Signed)
Pt refused EEG this morning

## 2015-03-01 NOTE — Progress Notes (Signed)
PT Cancellation Note  Patient Details Name: Tanya Mayer MRN: [id number] DOB: 10/29/1980   Cancelled Treatment:    Reason Eval/Treat Not Completed: Patient declined  Despite 9 minutes of empathetic listening and encouragement, pt refused to do anymore activity until after her test is complete and she gets to eat. Unable to persuade pt otherwise. Will re-attempt this pm as schedule permits.   [name] 03/01/2015, 10:18 AM Pager [phone]

## 2015-03-01 NOTE — Progress Notes (Signed)
Family Medicine Teaching Service Daily Progress Note Intern Pager: [phone]  Patient name: Tanya Mayer Ellender Medical record number: [id number] Date of birth: 07/30/1980 Age: 34 y.o. Gender: female  Primary Care Provider: No primary care provider on file. Consultants: neurology, cardiology Code Status: full  Pt Overview and Major Events to Date:  10/19-admitted with stroke  Assessment and Plan: Tanya Mayer Mcquade is a 34 y.o. female presenting with slurred speech and left sided weakness . PMH is significant for clotting disorder, DVT, hypertension, DM-2, Epilepsy, Bipolar disorder, Schizophrenia, depression, CVA in the past x 2 ( 09/2014, 04/2014 per pt.), ?IVC filter, Congenital heart defect s/p valve repair? Per pt.   Hemiplegia and slurred speech: ischemic stroke. CT head negative. MRI brain with acute infarct in the right frontal cortex 2.5 cm, small acute infarct left frontal cortex suggestive of emboli. MRA head negative. TTE with EF of 60-65%, mild mitral regurg, no anatomic abnormality. Out of the tpa window on presentation. EKG with sinus rhythm. Three CVA's in the past one year Apparently treated in [place] per pt. Hx of hypercoagulability, DVT, and IVC filter placement per patient but negative on KUB. She is on plavix and ASA prescribed by her physicians in [place]. Additionally she says that she had a "valve repair" as a baby, but does not know if she has a PFO. Exam remarkable for left sided decreased sensation. Motor 3/5 in left UE and LE. Down-going plantar reflex bilaterally. CN V and XI deficit on the left.  - Admit to telemetry. Attending Dr. Gwendolyn GrantWalden - Neuro checks q 2hrs - Appreciate neuro recs: - Plavix 75 mg daily. Not sure if this is working for her given CVA while on it.  - Hypercoagulablity labs: so far negative  - Homocysteine level elevated to 19.6. Hgb 15.6 MCV 87. Not clear if this is genetic or diet related. Will check folate level and B12. Will start  folic acid - beta-2 GP, Anticardiolipin Abs, Protein C total & Antithrombin activity within normal limit so far.  follow up pending labs -Appreciate card recs:  -TEE this morning  -consider arrythmia investigation - Start Atorvastatin 40 mg daily - Discuss about anticoagulation with neuro - LE PVL - F/u records from outside hospital-faxed a release form on 10/20. Will fax again today - SLP: Age appropriate regular solids;Thin   Elevated troponin: initial troponin of 0.09>0.12>0.11. No complaint of chest pain. HDS. EKG with sinus rhythm and no sign of ischemic changes. -f/u card consult  Hypertension: normotensive on presenation. Hypotensive this morning to 94/2151mmHg. On HCTZ at home - hold of her HCTZ now  DM-2: A1c 5.7 on 01/03/2015. On levemir 30 unit bid at home.Reports blood sugar of 360 yesterday. CBG 125 on presentation, 150 this AM.  - SSI  Diabetic neuropathy: on gabapentin 900 mg three times a day at home -400 mg three times a day due to renal funtion  Mildly elevated creatinine 1.5. B/l 1.2-1.3. 1.3 this morning. -IV NS at 19225ml/hr -will monitor BMP  Hx of DVT: Says she has IVC placed in 08/2014. Has hx of blood clot since she was young. Mother with hx of blood clot as well -Hypercoagulability panel  -Will get LE doppler  H/o Schizophrenia: on risperidone 0.25 mg and seroquel 300 mg at home at home. - continue home meds  H/o of Seizure: after she had stroke. Started on Keppra but pt. didn't feel like it worked for her? Not on any meds now. Neuro recommended EEG but patient refused it yesterday and this  morning. - Hold off keppra until she gets EEG  Asthma: stable now. On albuterol at home. Didn't use it within last week. Course sounds on lung exams -albuterol PRN  Social: pt moved to [facility] from [place] in August. She lives in shelter at [facility] with her husband. Denies domestic violence. She says her records are at [facility], and at [facility] in  [place].   FEN/GI: heart healthy diet  Prophylaxis:  -Lovonex  Disposition: floor with tele pending stroke & hypercoagulability work up.  Subjective:  Reports that she still have left sideded weakness. Denies numbness and tingling other than her baseline neuropathy. Feels her speech has improved. Asks for regular diet instead of heart healthy. However, she is NPO. She refused to EEG but willing to have one.  Objective: Temp:  [97.7 F (36.5 C)-98.7 F (37.1 C)] 98.7 F (37.1 C) (10/21 0606) Pulse Rate:  [63-78] 63 (10/21 0606) Resp:  [16-18] 18 (10/21 0606) BP: (100-120)/(53-76) 120/76 mmHg (10/21 0606) SpO2:  [99 %-100 %] 100 % (10/21 0606) Physical Exam: Gen: more alert, answers question appropriately. Eyes: PERRLA, EOMI, sclera anicteric, no conjunctival injection Nares: clear, no erythema, swelling or congestion Oropharynx: clear, moist CV: RRR. S1 & S2 audible, no murmurs, rubs.. Resp: no apparent WOB, CTAB. Abd: +BS. Soft, NDNT, no rebound or guarding.  Ext: No edema or gross deformities. Neuro: slurred speech improved, answers questions appropriately, CN-V, VII & XI deficit on left side but improved from yesterday, motor 3/5 in LUE and LLE. Finger to nose normal on right. Not able assess on left. Plantar reflex-down going bilaterally. Knee reflex 1+ bilaterally. Laboratory:  Recent Labs Lab 02/27/15 1014 02/27/15 1021  WBC 7.4  --   HGB 14.1 15.6*  HCT 43.0 46.0  PLT 177  --     Recent Labs Lab 02/27/15 1014 02/27/15 1021 02/28/15 0520  NA 134* 137 135  K 4.3 4.2 3.6  CL 101 103 105  CO2 21*  --  25  BUN 19 23* 16  CREATININE 1.50* 1.50* 1.29*  CALCIUM 9.5  --  8.6*  PROT 7.5  --   --   BILITOT 0.3  --   --   ALKPHOS 87  --   --   ALT 17  --   --   AST 26  --   --   GLUCOSE 114* 116* 102*    Imaging/Diagnostic Tests: Mr Shirlee Latch Wo Contrast  03/01/2015  CLINICAL DATA:  Initial evaluation for acute stroke. EXAM: MRA HEAD WITHOUT CONTRAST  TECHNIQUE: Angiographic images of the Circle of Willis were obtained using MRA technique without intravenous contrast. COMPARISON:  Prior study from 01/04/2015. FINDINGS: Study is moderately degraded by motion artifact. ANTERIOR CIRCULATION: Visualized distal cervical segments of the internal carotid arteries are patent with antegrade flow. The petrous, cavernous, and supraclinoid segments are widely patent bilaterally. Left A1 segment widely patent. Right A1 segment is patent but hypoplastic. Anterior communicating artery grossly normal. Anterior cerebral arteries well opacified bilaterally. M1 segments patent without stenosis or occlusion. MCA bifurcations normal. Distal MCA branches well opacified and symmetric bilaterally. POSTERIOR CIRCULATION: Vertebral arteries are patent to the vertebrobasilar junction. Left posterior inferior cerebral artery patent proximally. Right posterior inferior cerebral artery not visualized. Basilar artery widely patent. Dominant right anterior inferior cerebral artery. Superior cerebellar arteries patent proximally. Both posterior cerebral arteries arise from the basilar artery. PCAs are opacified to their distal aspects. No aneurysm or vascular malformation. IMPRESSION: Negative intracranial MRA. Electronically  Signed   By: Rise Mu M.D.   On: 03/01/2015 00:33   Dg Abd Portable 1v  02/28/2015  CLINICAL DATA:  Evaluate for presence of inferior vena cava filter EXAM: PORTABLE ABDOMEN - 1 VIEW COMPARISON:  None. FINDINGS: No inferior vena cava filter is identified on this study. There is a nonobstructive bowel gas pattern. IMPRESSION: No IVC filter identified Electronically Signed   By: Esperanza Heir M.D.   On: 02/28/2015 12:47    Almon Hercules, MD 03/01/2015, 8:06 AM PGY-1, [place] [facility] FPTS Intern pager: [phone], text pages welcome

## 2015-03-01 NOTE — Progress Notes (Signed)
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 Throwing a fit for receiving heart healthy diet and not regular diet.   CURRENT MEDS . atorvastatin  40 mg Oral q1800  . clopidogrel  75 mg Oral Daily  . enoxaparin (LOVENOX) injection  40 mg Subcutaneous Q24H  . gabapentin  400 mg Oral TID  . insulin aspart  0-24 Units Subcutaneous 6 times per day  . metroNIDAZOLE  500 mg Oral Once  . QUEtiapine  300 mg Oral QHS  . risperiDONE  0.25 mg Oral BID    OBJECTIVE  Filed Vitals:   02/28/15 2111 03/01/15 0159 03/01/15 0606 03/01/15 1120  BP: 104/67 111/65 120/76 146/99  Pulse: 71 66 63 67  Temp:  97.7 F (36.5 C) 98.7 F (37.1 C) 97.4 F (36.3 C)  TempSrc: Oral Oral Oral Oral  Resp: Height:      Weight:      SpO2: 99% 99% 100% 99%    Intake/Output Summary (Last 24 hours) at 03/01/15 1149 Last data filed at 03/01/15 0500  Gross per 24 hour  Intake 3158.75 ml  Output    900 ml  Net 2258.75 ml   Filed Weights   02/27/15 0959  Weight: 157 lb (71.215 kg)    PHYSICAL EXAM  General: Pleasant, NAD. Neuro: Alert and oriented X 3. Moves all extremities spontaneously. Psych: Normal affect. HEENT:  Normal  Neck: Supple without bruits or JVD. Lungs:  Resp regular and unlabored, CTA. Heart: RRR no s3, s4, or murmurs. Abdomen: Soft, non-tender, non-distended, BS + x 4.  Extremities: No clubbing, cyanosis or edema. DP/PT/Radials 2+ and equal bilaterally.  Accessory Clinical Findings  CBC  Recent Labs  02/27/15 1014 02/27/15 1021  WBC 7.4  --   NEUTROABS 5.9  --   HGB 14.1 15.6*  HCT 43.0 46.0  MCV 86.7  --   PLT 177  --    Basic Metabolic Panel  Recent Labs   69/62/95 1014 02/27/15 1021 02/28/15 0520  NA 134* 137 135  K 4.3 4.2 3.6  CL 101 103 105  CO2 21*  --  25  GLUCOSE 114* 116* 102*  BUN 19 23* 16  CREATININE 1.50* 1.50* 1.29*  CALCIUM 9.5  --  8.6*   Liver Function Tests  Recent Labs  02/27/15 1014  AST 26  ALT 17  ALKPHOS 87  BILITOT 0.3  PROT 7.5  ALBUMIN 3.7   Cardiac Enzymes  Recent Labs  02/28/15 0010 02/28/15 0520  TROPONINI 0.12* 0.11*   Hemoglobin A1C  Recent Labs  02/28/15 0130  HGBA1C 5.7*   Fasting Lipid Panel  Recent Labs  02/28/15 0115  CHOL 177  HDL 40*  LDLCALC 106*  TRIG 156*  CHOLHDL 4.4    TELE NSR without any sign of afib or aflutter    ECG  No new EKG  Echocardiogram 02/28/2015  LV EF: 60% -  65%  ------------------------------------------------------------------- Indications:   CVA 436.  ------------------------------------------------------------------- History:  Risk factors: Current tobacco use. Hypertension. Diabetes mellitus. Dyslipidemia.  ------------------------------------------------------------------- Study Conclusions  - Left ventricle: The cavity size was normal. Systolic function was normal. The estimated ejection fraction was  in the range of 60% to 65%. Wall motion was normal; there were no regional wall motion abnormalities. - Mitral valve: There was mild regurgitation.     Radiology/Studies  Ct Head Wo Contrast  02/27/2015  CLINICAL DATA:  Left-sided weakness, slurred speech, and facial droopupon awakening. EXAM: CT HEAD WITHOUT CONTRAST TECHNIQUE: Contiguous axial images were obtained from the base of the skull through the vertex without intravenous contrast. COMPARISON:  01/03/2015 FINDINGS: Skull and Sinuses:Negative for fracture or destructive process. The mastoids, middle ears, and imaged paranasal sinuses are clear. Orbits: No acute abnormality. Brain: No evidence of acute infarction, hemorrhage, hydrocephalus, or shift.  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 IMPRESSION: Negative head CT. Electronically Signed   By: Marnee SpringJonathon  Watts M.D.   On: 02/27/2015 11:01   Mr Maxine GlennMra Head Wo Contrast  03/01/2015  CLINICAL DATA:  Initial evaluation for acute stroke. EXAM: MRA HEAD WITHOUT CONTRAST TECHNIQUE: Angiographic images of the Circle of Willis were obtained using MRA technique without intravenous contrast. COMPARISON:  Prior study from 01/04/2015. FINDINGS: Study is moderately degraded by motion artifact. ANTERIOR CIRCULATION: Visualized distal cervical segments of the internal carotid arteries are patent with antegrade flow. The petrous, cavernous, and supraclinoid segments are widely patent bilaterally. Left A1 segment widely patent. Right A1 segment is patent but hypoplastic. Anterior communicating artery grossly normal. Anterior cerebral arteries well opacified bilaterally. M1 segments patent without stenosis or occlusion. MCA bifurcations normal. Distal MCA branches well opacified and symmetric bilaterally. POSTERIOR CIRCULATION: Vertebral arteries are patent to the vertebrobasilar junction. Left posterior inferior cerebral artery patent proximally. Right posterior inferior cerebral artery not visualized. Basilar artery widely patent. Dominant right anterior inferior cerebral artery. Superior cerebellar arteries patent proximally. Both posterior cerebral arteries arise from the basilar artery. PCAs are opacified to their distal aspects. No aneurysm or vascular malformation. IMPRESSION: Negative intracranial MRA. Electronically Signed   By: Rise MuBenjamin  McClintock M.D.   On: 03/01/2015 00:33   Mr Brain Wo Contrast  02/27/2015  CLINICAL DATA:  Left hemiparesis.  Mental status change. EXAM: MRI HEAD WITHOUT CONTRAST TECHNIQUE: Multiplanar, multiecho pulse sequences of the brain and surrounding structures were obtained without intravenous contrast. COMPARISON:  CT head 02/27/2015.  MRI 01/03/2015 FINDINGS: Acute infarct  right frontal cortex . This measures 2.5 cm. Small acute infarct left frontal cortex. No associated hemorrhage Ventricle size normal.  Cerebral volume normal. Cerebral white matter normal without evidence of chronic ischemia. Negative for demyelinating disease. Brainstem and cerebellum and basal ganglia normal. Negative for mass or edema. Pituitary normal in size.  Orbits normal.  Paranasal sinuses clear. IMPRESSION: Acute infarct in the right frontal cortex 2.5 cm. Small acute infarct left frontal cortex. Findings are suggestive of emboli. Recommend evaluation for cardiac source of emboli. Electronically Signed   By: Marlan Palauharles  Clark M.D.   On: 02/27/2015 15:59   Dg Chest Port 1 View  02/27/2015  CLINICAL DATA:  Shortness of breath. Stroke symptoms with left-sided weakness. EXAM: PORTABLE CHEST 1 VIEW COMPARISON:  01/03/2015 FINDINGS: The cardiac silhouette is upper limits of normal to mildly enlarged in size, unchanged. The patient has taken a shallower inspiration than on the prior study with minimal left basilar atelectasis noted. No pulmonary edema, pleural effusion, or pneumothorax is identified. No acute osseous abnormality is seen. IMPRESSION: Hypoinflation without evidence of acute cardiopulmonary disease. Electronically Signed   By: Sebastian AcheAllen  Grady M.D.   On: 02/27/2015 10:38   Dg Abd Portable 1v  02/28/2015  CLINICAL DATA:  Evaluate for presence of  inferior vena cava filter EXAM: PORTABLE ABDOMEN - 1 VIEW COMPARISON:  None. FINDINGS: No inferior vena cava filter is identified on this study. There is a nonobstructive bowel gas pattern. IMPRESSION: No IVC filter identified Electronically Signed   By: Esperanza Heir M.D.   On: 02/28/2015 12:47    ASSESSMENT AND PLAN  1. Elevated trop  - does have recent CP, unclear if related to stroke, embolic event to coronary vessel vs stress - echo 02/28/2015 EF 60-65%, no RWMA. Will discuss with MD regarding whether there is any need  to pursue myoview, no sign of ACS. Homeless situation complicate outpatient followup   2. Recurrent CVA concerning for emboli - MRI however showed acute infarct of right frontal cortex 2.5 cm, along with small acute infarct of left frontal cortex, findings consistent with emboli. - MRA negative  - pending LE venous doppler (done yesterday, but I do not see a report) - will likely need to transition from plavix with coumadin vs NOAC given recurrent stroke on plavix alone, problem is her homeless situation (she is currently living in a shelter)  - TEE today showed hypermobile atrial septum with large patent foramen ovale with substantial R to L shunt seen by color doppler. Very high likelihood of having stroke 2/2 paradoxical amboli. Again will need anticoagulation when felt safe by neurology. Also recommended ASD/PFO device closure.  3. 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 Pending LE venous doppler  4. Hypercoagulable disorder s/p IVC filter in 2015 at [facility] in [place] - hypercoagulable panel per IM  5. H/o congenital heart defect s/p repair at [facility] - will followup with echo result with comment on morphology - will need record from OSH, requested, however has not received from [facility] yet  6. HLD: Chol 177, LDL 106, HDL 40, trig 156  7. DM II  8. Trichomonas: per IM  9. AKI: Cr improving  10. Epilepsy/Schizophrenia/Depression/Bipolar  Signed, Azalee Course PA-C Pager: [phone]  I have personally seen and examined this patient with Azalee Course, PA-C. I agree with the assessment and plan as outlined above. She underwent TEE today which shows large PFO with right to left shunting. She is admitted with acute CVA. She has known venous thromboembolic disease. There is an IVC  filter in place. She will need long anti-coagulation. We can consider PFO closure but given her IVC filter, this may not be able to be done via percutaneous approach. She is homeless so long term anti-coagulation will be difficult. Plans per stroke team. I will forward this information to our ASD/PFO closure team. PFO closure will be planned as an outpatient  MCALHANY,CHRISTOPHER 03/01/2015 '2:29 PM

## 2015-03-01 NOTE — Progress Notes (Signed)
  Echocardiogram Echocardiogram Transesophageal has been performed.  Leta JunglingCooper, Retina Bernardy M 03/01/2015, 12:50 PM

## 2015-03-01 NOTE — Evaluation (Addendum)
Physical Therapy Evaluation and Discharge Patient Details Name: Tanya Mayer MRN: [id number] DOB: 1981-02-15 Today's Date: 03/01/2015   History of Present Illness  34 y.o. 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 Imaging showed L frontal infact. Pt admitted with same symptoms in 8/16.  Pt has residual L weakness from this CVA but this weakness is worse and now has numbness.  pt found to have R and L frontal acute CVAs; TEE +PFO     Clinical Impression  Patient evaluated by Physical Therapy with no further PT needs identified. Patient with inconsistent demonstration of strength and sensation during testing compared to how she functionally uses her left limbs. Ambulated 243ft with RW and no physical assist (supervision for IV and ?cognition/behavioral issues). Noted inconsistencies occurred during OT evaluation as well. PT is signing off. Thank you for this referral.     Follow Up Recommendations No PT follow up    Equipment Recommendations  None recommended by PT    Recommendations for Other Services       Precautions / Restrictions Precautions Precautions: Fall Precaution Comments: pt denies falls in past 3 months Restrictions Weight Bearing Restrictions: No      Mobility  Bed Mobility Overal bed mobility: Modified Independent Bed Mobility: Supine to Sit;Sit to Supine     Supine to sit: Modified independent (Device/Increase time) Sit to supine: Modified independent (Device/Increase time)   General bed mobility comments: assisted her LLE off EOB, however on return to bed she independently lifted LLE up onto bed  Transfers Overall transfer level: Needs assistance Equipment used: Rolling walker (2 wheeled) Transfers: Sit to/from Stand Sit to Stand: Min guard         General transfer comment: pt accustomed to rollator that locks and she uses hands on grips to come to stand; minguard  due to possibility of RW slipping (Did not)  Ambulation/Gait Ambulation/Gait assistance: Supervision Ambulation Distance (Feet): 200 Feet Assistive device: Rolling walker (2 wheeled) Gait Pattern/deviations: Step-through pattern;Decreased stride length;Wide base of support (feet turned out (equally); LLE hip hike) Gait velocity: WFL   General Gait Details: no loss of balance or drift; despite reports of Rt hand insensate, she was able to grip walker the entire time, even when distracted, turning her upper body to the right to look behind herself  Stairs Stairs:  (none; uses elevator)          Wheelchair Mobility    Modified Rankin (Stroke Patients Only) Modified Rankin (Stroke Patients Only) Pre-Morbid Rankin Score: Slight disability Modified Rankin: Moderately severe disability (based on OT eval and ADL needs)     Balance Overall balance assessment: No apparent balance deficits (not formally assessed)   Sitting balance-Leahy Scale: Fair Sitting balance - Comments: Pt could maintain static sit holding to bed rail with L side and hand down on bed on the R.  pt did take some challenges well but at times leaned to the L but never fell over.  She stated she would fall over if she moved her L hand off bedrail.  L hand moved and pt maintained sitting.   Standing balance support: No upper extremity supported Standing balance-Leahy Scale: Good Standing balance comment: completed pivot as approaching bed without RW                             Pertinent Vitals/Pain Pain Assessment: No/denies pain  Home Living Family/patient expects to be discharged to:: Shelter/Homeless                 Additional Comments: Pt's husband lives in shelter as well    Prior Function Level of Independence: Independent with assistive device(s)         Comments: used a borrowed Occupational hygienistrollator at [employer], has RW, Independent with bathing/dressing, uses Engineer, structuralelevator at [employer].   Reports shower is a handicap shower with grab bars.     Hand Dominance   Dominant Hand: Left    Extremity/Trunk Assessment   Upper Extremity Assessment: Defer to OT evaluation           Lower Extremity Assessment: LLE deficits/detail   LLE Deficits / Details: hip flexion at least 3/5 as she could raise her leg onto bed sit to supine; knee extension tested 2+ however functionally she had no knee instability while walking; annkle DF 0, toe extension 2 (pt with limited ankle PROM (~80-100 degrees) and reports this is baseline for her--she reports LLE is dragging more than usual, however adequate DF demonstrated with gait and no dragging   LLE Sensation-reports completely asensate, however functionally uses it in a manner consistent with normal sensation  Cervical / Trunk Assessment: Normal  Communication   Communication: Other (comment) (mildly dysarthric)  Cognition Arousal/Alertness: Awake/alert Behavior During Therapy: WFL for tasks assessed/performed Overall Cognitive Status: No family/caregiver present to determine baseline cognitive functioning                 General Comments: Pt with decr awareness of IV line/tubing (however this is different than her normal setting); good problem-solving with moving objects on her tray and her entire tray table to make things more accessible to her    General Comments      Exercises Other Exercises Other Exercises: encouraged continued AROM LLE as much as able; walking with nsg assist      Assessment/Plan    PT Assessment Patent does not need any further PT services  PT Diagnosis Hemiplegia dominant side   PT Problem List    PT Treatment Interventions     PT Goals (Current goals can be found in the Care Plan section) Acute Rehab PT Goals Patient Stated Goal: to get some food PT Goal Formulation: All assessment and education complete, DC therapy    Frequency     Barriers to discharge        Co-evaluation                End of Session Equipment Utilized During Treatment: Gait belt Activity Tolerance: Patient tolerated treatment well Patient left: in bed;with call bell/phone within reach Nurse Communication: Mobility status         Time: [id number] PT Time Calculation (min) (ACUTE ONLY): 20 min   Charges:   PT Evaluation $Initial PT Evaluation Tier I: 1 Procedure     PT G Codes:        Mirza Fessel 03/01/2015, 4:08 PM Pager [phone]

## 2015-03-01 NOTE — Op Note (Signed)
INDICATIONS: cryptogenic stroke  PROCEDURE:   Informed consent was obtained prior to the procedure. The risks, benefits and alternatives for the procedure were discussed and the patient comprehended these risks.  Risks include, but are not limited to, cough, sore throat, vomiting, nausea, somnolence, esophageal and stomach trauma or perforation, bleeding, low blood pressure, aspiration, pneumonia, infection, trauma to the teeth and death.    After a procedural time-out, the oropharynx was anesthetized with 20% benzocaine spray. The patient was given 5 mg versed and 50 mcg fentanyl for moderate sedation.   The transesophageal probe was inserted in the esophagus and stomach without difficulty and multiple views were obtained.  The patient was kept under observation until the patient left the procedure room.  The patient left the procedure room in stable condition.   Agitated microbubble saline contrast was administered.  COMPLICATIONS:    There were no immediate complications.  FINDINGS:  There is a tiny ASD with very small left to right shunt. However there is also a hypermobile atrial septum with a large patent foramen ovale. With every breath, there is a brief, but substantial right to left shunt seen by color Doppler and saline contrast. Very high likelihood that her strokes are due to paradoxical embolism, in view of known venous thromboembolic disease and IVC filter.  RECOMMENDATIONS:    Anticoagulation.  ASD/PFO device closure.  Time Spent Directly with the Patient:  60 minutes   Trayshawn Durkin 03/01/2015, 12:22 PM

## 2015-03-01 NOTE — Interval H&P Note (Signed)
History and Physical Interval Note:  03/01/2015 10:47 AM  Tanya Mayer  has presented today for surgery, with the diagnosis of stroke  The various methods of treatment have been discussed with the patient and family. After consideration of risks, benefits and other options for treatment, the patient has consented to  Procedure(s): TRANSESOPHAGEAL ECHOCARDIOGRAM (TEE) (N/A) as a surgical intervention .  The patient's history has been reviewed, patient examined, no change in status, stable for surgery.  I have reviewed the patient's chart and labs.  Questions were answered to the patient's satisfaction.     Nalini Alcaraz

## 2015-03-01 NOTE — Evaluation (Signed)
Occupational Therapy Evaluation Patient Details Name: Tanya Mayer MRN: [id number] DOB: [birth year] Today's Date: 03/01/2015    History of Present Illness 34 y.o. 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 Imaging showed L frontal infact. Pt admitted with same symptoms in 8/16.  Pt has residual L weakness from this CVA but this weakness is worse and now has numbness.  pt found to have R and L frontal acute CVAS.   Clinical Impression   Pt seen for the above diagnosis and has the deficits listed below. Pt would benefit from cont OT to increase independence in basic adls and adl mobility so she can return to her homeless shelter with her husband.  Pt has been admitted for similar symptoms recently but now has more weakness and numbness in LUE which will make adls at home much more difficult.  Feel pt would benefit from further rehab before returning home but not sure she is agreeable.    Follow Up Recommendations  Home health OT;Supervision/Assistance - 24 hour (rec CIR but pt may refuse so HHOT)    Equipment Recommendations  Tub/shower seat    Recommendations for Other Services Other (comment) (feel rehab may be appropriate but she refuses.)     Precautions / Restrictions Precautions Precautions: Fall Precaution Comments: pt denies falls in past 3 months Restrictions Weight Bearing Restrictions: No      Mobility Bed Mobility Overal bed mobility: Needs Assistance Bed Mobility: Supine to Sit;Rolling;Sit to Supine Rolling: Min guard   Supine to sit: Min assist Sit to supine: Min assist   General bed mobility comments: Pt moves well in bed given her weakness on MMT.  pt moved self up in bed w.o assist with trunk muscles and legs.  Transfers Overall transfer level: Needs assistance Equipment used: Rolling walker (2 wheeled) Transfers: Sit to/from [employer] Sit to Stand: Min  guard Stand pivot transfers: Min assist       General transfer comment: Min assist provided to [facility] and pt was closer to min guard returning to bed.  Pt declined sitting in chair.    Balance Overall balance assessment: Needs assistance Sitting-balance support: Bilateral upper extremity supported;Feet supported Sitting balance-Leahy Scale: Fair Sitting balance - Comments: Pt could maintain static sit holding to bed rail with L side and hand down on bed on the R.  pt did take some challenges well but at times leaned to the L but never fell over.  She stated she would fall over if she moved her L hand off bedrail.  L hand moved and pt maintained sitting.   Standing balance support: Bilateral upper extremity supported;During functional activity Standing balance-Leahy Scale: Poor Standing balance comment: Pt required outside assist to stand.                            ADL Overall ADL's : Needs assistance/impaired Eating/Feeding: Set up;Sitting Eating/Feeding Details (indicate cue type and reason): Pt currently NPO for test but fed self yesterday using nondominant hand with set up. Grooming: Wash/dry hands;Wash/dry face;Oral care;Sitting;Minimal assistance Grooming Details (indicate cue type and reason): occasional min assist due to weakness in LUE but pt did problem solve well on how to use the R side. Upper Body Bathing: Minimal assitance;Sitting Upper Body Bathing Details (indicate cue type and reason): min assist to wash R arm with LUE. Lower Body Bathing: Moderate assistance;Sit to/from stand Lower  Body Bathing Details (indicate cue type and reason): Pt requires min assist to cross L leg over R and to stand and wash back side. Upper Body Dressing : Moderate assistance;Sitting Upper Body Dressing Details (indicate cue type and reason): educated re hemi technique. Lower Body Dressing: Moderate assistance;Sit to/from stand Lower Body Dressing Details (indicate cue type and  reason): mod asssist and cues to dress L side first and assist to stand. Toilet Transfer: Minimal assistance;RW;Stand-pivot StatisticianToilet Transfer Details (indicate cue type and reason): pivot.  Pt reports no feeling in L but does use walker better than expected for having no feeling.  Hand does not slip off of the walker and placment of hand and foot fairly accurate for having no feeling.  [facility] 4 steps [facility]. Toileting- Clothing Manipulation and Hygiene: Min guard;Sitting/lateral lean       Functional mobility during ADLs: Minimal assistance;Rolling walker General ADL Comments: Considering the amount of weakness pt has on MMT, pt performs adls fairly well. Currently she needs min to mod assist at times and does need assist with fasterrs and fine motor tasks.  Pt will need a shower chair for [facility].     Vision Vision Assessment?: No apparent visual deficits   Perception Perception Perception Tested?: Yes Comments: WFL   Praxis Praxis Praxis tested?: Within functional limits    Pertinent Vitals/Pain Pain Assessment: No/denies pain     Hand Dominance Left   Extremity/Trunk Assessment Upper Extremity Assessment Upper Extremity Assessment: LUE deficits/detail LUE Deficits / Details: Shoulder flex appx 40 degrees(strength 2/5) Biceps and triceps with full ROM (strength 3+/5), grip 3+/5.   LUE Sensation: decreased light touch;decreased proprioception LUE Coordination: decreased fine motor;decreased gross motor   Lower Extremity Assessment Lower Extremity Assessment: Defer to PT evaluation   Cervical / Trunk Assessment Cervical / Trunk Assessment: Normal   Communication Communication Communication: Other (comment) (mildly dysarthric)   Cognition Arousal/Alertness: Awake/alert Behavior During Therapy: WFL for tasks assessed/performed Overall Cognitive Status: Impaired/Different from baseline Area of Impairment: Memory;Safety/judgement;Awareness          Safety/Judgement: Decreased awareness of safety;Decreased awareness of deficits Awareness: Emergent   General Comments: Not sure pt's cognitive baseline since CVA in 8/16. Pt appears to have decent memory but deficits more frontal related such as impulsiveness and decreased awareness.   General Comments       Exercises       Shoulder Instructions      Home Living Family/patient expects to be discharged to:: Shelter/Homeless                                 Additional Comments: Pt's husband lives in shelter as well  Lives With: Spouse    Prior Functioning/Environment Level of Independence: Independent with assistive device(s)        Comments: used a borrowed Occupational hygienistrollator at [facility], has RW, Independent with bathing/dressing, uses Engineer, structuralelevator at [facility].  Reports shower is a handicap shower with grab bars.    OT Diagnosis: Generalized weakness;Cognitive deficits;Hemiplegia dominant side   OT Problem List: Decreased strength;Decreased range of motion;Impaired balance (sitting and/or standing);Decreased coordination;Decreased cognition;Decreased safety awareness;Decreased knowledge of use of DME or AE;Impaired sensation;Impaired UE functional use   OT Treatment/Interventions: Self-care/ADL training;Therapeutic exercise;Neuromuscular education;DME and/or AE instruction;Therapeutic activities    OT Goals(Current goals can be found in the care plan section) Acute Rehab OT Goals Patient Stated Goal: to eat, pt just placed on  diet, and to go home OT Goal Formulation: With patient Time For Goal Achievement: 03/15/15 Potential to Achieve Goals: Good ADL Goals Pt Will Perform Grooming: with min guard assist;standing Pt Will Perform Lower Body Bathing: with supervision;sit to/from stand Pt Will Perform Upper Body Dressing: with min assist;sitting (assist with fasteners only) Pt Will Perform Lower Body Dressing: with min assist;sit to/from stand Pt Will Perform  Tub/Shower Transfer: with min guard assist;rolling walker;ambulating;shower seat;grab bars;Shower transfer Pt/caregiver will Perform Home Exercise Program: Increased strength;Left upper extremity;With written HEP provided Additional ADL Goal #1: pt will transfer to comfort commode and toilet with S and walker  OT Frequency: Min 3X/week   Barriers to D/C: Decreased caregiver support  Lives at homeless shelter with husband.       Co-evaluation              End of Session Equipment Utilized During Treatment: Engineer, water Communication: Mobility status  Activity Tolerance: Patient tolerated treatment well;No increased pain Patient left: in bed;with chair alarm set;with call bell/phone within reach   Time: 1610-9604 OT Time Calculation (min): 45 min Charges:  OT General Charges $OT Visit: 1 Procedure OT Evaluation $Initial OT Evaluation Tier I: 1 Procedure OT Treatments $Self Care/Home Management : 23-37 mins G-Codes:    Hope Budds Mar 23, 2015, 10:00 AM  [phone]

## 2015-03-01 NOTE — Progress Notes (Signed)
*  Preliminary Results* Bilateral lower extremity venous duplex completed. Bilateral lower extremities are negative for deep vein thrombosis. There is no evidence of Baker's cyst bilaterally.  02/28/2015  Rayshaun Needle, RVT, RDCS, RDMS  

## 2015-03-02 ENCOUNTER — Inpatient Hospital Stay (HOSPITAL_COMMUNITY): Payer: Medicaid Other

## 2015-03-02 DIAGNOSIS — Z794 Long term (current) use of insulin: Secondary | ICD-10-CM

## 2015-03-02 DIAGNOSIS — E1140 Type 2 diabetes mellitus with diabetic neuropathy, unspecified: Secondary | ICD-10-CM

## 2015-03-02 DIAGNOSIS — I63133 Cerebral infarction due to embolism of bilateral carotid arteries: Secondary | ICD-10-CM

## 2015-03-02 DIAGNOSIS — N390 Urinary tract infection, site not specified: Secondary | ICD-10-CM

## 2015-03-02 LAB — D-DIMER, QUANTITATIVE: D-Dimer, Quant: 0.88 ug/mL-FEU — ABNORMAL HIGH (ref 0.00–0.48)

## 2015-03-02 LAB — GLUCOSE, CAPILLARY
GLUCOSE-CAPILLARY: 94 mg/dL (ref 65–99)
GLUCOSE-CAPILLARY: 125 mg/dL — AB (ref 65–99)
GLUCOSE-CAPILLARY: 90 mg/dL (ref 65–99)
Glucose-Capillary: 122 mg/dL — ABNORMAL HIGH (ref 65–99)

## 2015-03-02 LAB — BASIC METABOLIC PANEL
Anion gap: 8 (ref 5–15)
BUN: 13 mg/dL (ref 6–20)
CHLORIDE: 106 mmol/L (ref 101–111)
CO2: 22 mmol/L (ref 22–32)
Calcium: 9 mg/dL (ref 8.9–10.3)
Creatinine, Ser: 1.41 mg/dL — ABNORMAL HIGH (ref 0.44–1.00)
GFR calc Af Amer: 55 mL/min — ABNORMAL LOW (ref >60)
GFR, EST NON AFRICAN AMERICAN: 48 mL/min — AB (ref >60)
GLUCOSE: 100 mg/dL — AB (ref 65–99)
POTASSIUM: 4.2 mmol/L (ref 3.5–5.1)
Sodium: 136 mmol/L (ref 135–145)

## 2015-03-02 MED ORDER — TRAMADOL HCL 50 MG PO TABS
100.0000 mg | ORAL_TABLET | Freq: Four times a day (QID) | ORAL | Status: DC | PRN
  Administered 2015-03-02: 100 mg via ORAL
  Administered 2015-03-02: 50 mg via ORAL
  Administered 2015-03-03 – 2015-03-04 (×5): 100 mg via ORAL
  Filled 2015-03-02 (×7): qty 2

## 2015-03-02 MED ORDER — ACETAMINOPHEN 325 MG PO TABS: 650.0000 mg | ORAL_TABLET | Freq: Once | ORAL | Status: DC

## 2015-03-02 MED ORDER — TRAMADOL HCL 50 MG PO TABS
50.0000 mg | ORAL_TABLET | Freq: Four times a day (QID) | ORAL | Status: DC | PRN
  Administered 2015-03-02: 50 mg via ORAL
  Filled 2015-03-02: qty 1

## 2015-03-02 NOTE — Progress Notes (Signed)
Patient wanting "percocet 30", " I want to talk to the doctor".  She states that," this is a her pain med after her last stroke." No narcotic analgesic is listed as her home med, and she has not c/o pain. MD paged.

## 2015-03-02 NOTE — Progress Notes (Signed)
Patient informed that vascular lab is coming to do carotid dopplers. Patient stated, "no not right now". After informing the vascular staff she is refusing, patient stated, " I'll do it, just not right now." Relayed info to vascular tech. Educated patient that she needs to have the test completed when they come, or it may have to be done tomorrow. Patient anxious with pressured speech. MD notified that patient wants her diet changed, "NOW".

## 2015-03-02 NOTE — Progress Notes (Signed)
Patient with marked weakness at L side. Patient insisting on sitting at SOB, refusing bed/chair with alarm. Educated to safety issues with no noted retention of safety info. Bed alarm on.

## 2015-03-02 NOTE — Progress Notes (Signed)
Patient now C/O pain at L shoulder and mid back, 10/10 pain score,constant, stabbing pain. MD notified and said he would order ultram.

## 2015-03-02 NOTE — Progress Notes (Signed)
*  PRELIMINARY RESULTS* Vascular Ultrasound Carotid Duplex (Doppler) has been completed.  Preliminary findings: Bilateral: No significant (1-39%) ICA stenosis. Antegrade vertebral flow.    Farrel DemarkJill Eunice, RDMS, RVT  03/02/2015, 10:59 AM

## 2015-03-02 NOTE — Progress Notes (Signed)
Family Medicine Teaching Service Daily Progress Note Intern Pager: [phone]  Patient name: Tanya Mayer Medical record number: [id number] Date of birth: 06/25/1980 Age: 34 yMayero. Gender: female  Primary Care Provider: No primary care provider on file. Consultants: neurology, cardiology Code Status: full  Pt Overview and Major Events to Date:  10/19-admitted with stroke  Assessment and Plan: Tanya Mayer is a 34 yMayero. female presenting with slurred speech and left sided weakness . PMH is significant for clotting disorder, DVT, hypertension, DM-2, Epilepsy, Bipolar disorder, Schizophrenia, depression, CVA in the past x 2 ( 09/2014, 04/2014 per pt.), ?IVC filter, Congenital heart defect s/p valve repair? Per pt.   Hemiplegia and slurred speech: ischemic stroke by MRI. CT head negative. MRI brain with acute infarct in the right frontal cortex 2Mayer5 cm, small acute infarct left frontal cortex suggestive of emboli. MRA head negative. Carotid Dopplers Negative. TEE with R> L shunt ASD, PFO.  Hx of hypercoagulability, DVT, and IVC filter placement per patient but negative on KUB. She is on plavix and ASA [place] by [place] in [place]. Persistent left sided weakness / numbness. Some effort dependent neuro findings.  - Neuro checks.  - Appreciate neuro recs: - Plavix 75 mg daily. No other anticoagulation at this time.  - Hypercoagulablity labs: so far negative  - Homocysteine level elevated to 19Mayer6. - started folic acid.   - No PFO / ASD closure at this time for prevention of embolic CVA.  - beta-2 GP, Anticardiolipin Abs, Protein C total & Antithrombin activity within normal limit so far.  follow up pending labs - Atorvastatin 40 mg daily - F/u records from outside hospital - Regular diet.  - DG A/P of hip to look for filter.   Elevated troponin: initial troponin of 0Mayer09>0Mayer12>0Mayer11. No complaint of chest pain. HDS. EKG with sinus rhythm and no sign of ischemic changes. -  on statin, ASA - Cards on board. No further ischemic workup at this time.   Hypertension: normotensive on presenation. Hypotensive this morning to 94/6051mmHg. On HCTZ at home - hold of her HCTZ now  DM-2: A1c 5Mayer7 on 01/03/2015. On levemir 30 unit bid at homeMayerReports blood sugar of 360 yesterday. CBG 125 on presentation, 150 this AM.  - SSI  Diabetic neuropathy: on gabapentin 900 mg three times a day at home -400 mg three times a day due to renal funtion  Mildly elevated creatinine last 1Mayer3. . B/l 1Mayer2-1Mayer3.  - BMET today.  - No fluids at this time.   Hx of DVT: Says she has IVC placed in 08/2014. Has hx of blood clot since she was young. Mother with hx of blood clot as well -Hypercoagulability panel  - No DVT.   H/o Schizophrenia: on risperidone 0Mayer25 mg and seroquel 300 mg at home at home. - continue home meds  H/o of Seizure: after she had stroke. Started on Keppra but pt. didn't feel like it worked for her? Not on any meds now. Neuro recommended EEG but patient refused it yesterday and this morning. - Hold off keppra until she gets EEG  Asthma: stable now. On albuterol at home. Didn't use it within last week. Course sounds on lung exams -albuterol PRN  Social: [place] [facility] [place]. [facility] with her husband. Denies domestic violence. [facility], and [facility] [place].   FEN/GI: heart healthy diet  Prophylaxis:  -Lovonex  Disposition: floor  with tele   Subjective:  No new weakness, or other deficits overnight. Wants a regular diet. She says that she will get the EEG if she must. Understands that we are continuing stroke workup. She says that her IVC was placed in her right leg? She says that they told her the filter was in the pelvic vein of her right leg due to clot on that side?   Objective: Temp:  [98 F (36Mayer7 C)-98Mayer6 F (37 C)] 98Mayer1 F (36Mayer7 C) (10/22 0942) Pulse Rate:   [70-89] 80 (10/22 0942) Resp:  [14-20] 16 (10/22 0942) BP: (96-150)/(64-121) 133/101 mmHg (10/22 0942) SpO2:  [95 %-100 %] 100 % (10/22 0942) Physical Exam: Gen: AAOx3, appropriate.  Eyes: PERRLA, EOMI, sclera anicteric HEENT: NCAT, PERRLA, EOMI. No numbness or weakness / facial droop in her face today. Speech clear.  CV: RRR. S1 & S2 audible, no murmurs, rubs. Resp: no apparent WOB, CTAB. Abd: +BS. Soft, NDNT, no rebound or guarding.  Ext: No edema or gross deformities. Neuro: CN II-XII appear in tact to testing, weakness 3-4/5 on the left in both extremities, with pt. Complaint of continued sensory deficit on that side. upgoing toes bilaterally. Weakness is effort dependent. She guards when L arm is lifted and dropped. Gait not assessed. No dysmetria. Cerebellar in tact.  Laboratory:  Recent Labs Lab 02/27/15 1014 02/27/15 1021  WBC 7Mayer4  --   HGB 14Mayer1 15Mayer6*  HCT 43Mayer0 46Mayer0  PLT 177  --     Recent Labs Lab 02/27/15 1014 02/27/15 1021 02/28/15 0520  NA 134* 137 135  K 4Mayer3 4Mayer2 3Mayer6  CL 101 103 105  CO2 21*  --  25  BUN 19 23* 16  CREATININE 1Mayer50* 1Mayer50* 1Mayer29*  CALCIUM 9Mayer5  --  8Mayer6*  PROT 7Mayer5  --   --   BILITOT 0Mayer3  --   --   ALKPHOS 87  --   --   ALT 17  --   --   AST 26  --   --   GLUCOSE 114* 116* 102*    MRI / MRA - bilateral Frontal lobe infarcts largest 2Mayer5cm   Carotid doppler - negative for significant stenosis  LE Doppler - no DVT  KUB - no evidence of IVC.   Imaging/Diagnostic Tests: No results found.  Yolande Jolly, MD 03/02/2015, 12:06 PM PGY-1, [facility] Family Medicine FPTS Intern pager: [phone], text pages welcome

## 2015-03-02 NOTE — Progress Notes (Signed)
STROKE TEAM PROGRESS NOTE   SUBJECTIVE (INTERVAL HISTORY) No family members present. The patient was sleeping soundly - did not respond to loud voice. He eventually woke with gentle shaking. She was not happy about being awakened but did cooperate with exam.   OBJECTIVE Temp:  [98 F (36.7 C)-98.6 F (37 C)] 98.1 F (36.7 C) (10/22 0942) Pulse Rate:  [67-89] 80 (10/22 0942) Cardiac Rhythm:  [-] Normal sinus rhythm (10/22 0706) Resp:  [11-20] 16 (10/22 0942) BP: (96-166)/(64-121) 133/101 mmHg (10/22 0942) SpO2:  [95 %-100 %] 100 % (10/22 0942)  CBC:   Recent Labs Lab 02/27/15 1014 02/27/15 1021  WBC 7.4  --   NEUTROABS 5.9  --   HGB 14.1 15.6*  HCT 43.0 46.0  MCV 86.7  --   PLT 177  --     Basic Metabolic Panel:   Recent Labs Lab 02/27/15 1014 02/27/15 1021 02/28/15 0520  NA 134* 137 135  K 4.3 4.2 3.6  CL 101 103 105  CO2 21*  --  25  GLUCOSE 114* 116* 102*  BUN 19 23* 16  CREATININE 1.50* 1.50* 1.29*  CALCIUM 9.5  --  8.6*    Lipid Panel:     Component Value Date/Time   CHOL 177 02/28/2015 0115   TRIG 156* 02/28/2015 0115   HDL 40* 02/28/2015 0115   CHOLHDL 4.4 02/28/2015 0115   VLDL 31 02/28/2015 0115   LDLCALC 106* 02/28/2015 0115   HgbA1c:  Lab Results  Component Value Date   HGBA1C 5.7* 02/28/2015   Urine Drug Screen:     Component Value Date/Time   LABOPIA NONE DETECTED 02/27/2015 1137   COCAINSCRNUR NONE DETECTED 02/27/2015 1137   LABBENZ NONE DETECTED 02/27/2015 1137   AMPHETMU NONE DETECTED 02/27/2015 1137   THCU POSITIVE* 02/27/2015 1137   LABBARB NONE DETECTED 02/27/2015 1137      IMAGING  Ct Head Wo Contrast 02/27/2015  Negative head CT.   Mr Brain Wo Contrast 02/27/2015    Acute infarct in the right frontal cortex 2.5 cm. Small acute infarct left frontal cortex. Findings are suggestive of emboli. Recommend evaluation for cardiac source of emboli.   Dg Chest Port 1 View 02/27/2015  Hypoinflation without evidence of acute  cardiopulmonary disease.   2D Echocardiogram   - Left ventricle: The cavity size was normal. Systolic function wasnormal. The estimated ejection fraction was in the range of 60% to 65%. Wall motion was normal; there were no regional wallmotion abnormalities. - Mitral valve: There was mild regurgitation.  MRA head Negative intracranial MRA.  LE doppler negative   PHYSICAL EXAM [name] currently not in distress. . Afebrile. Head is nontraumatic. Neck is supple without bruit.    Cardiac exam no murmur or gallop. Lungs are clear to auscultation. Distal pulses are well felt. Neurological Exam :  Awake alert oriented 3. Affect is flat with belle la indeference. Follows commands well. Speech is clear without slurred speech or dysarthria. Extraocular moments are full range without nystagmus. Visual acuity seems adequate. Fundi were not visualized. Mild left lower facial weakness. Tongue midline. Motor system exam good effort on the right with normal strength. Poor effort on the left side and will not hold left upper extremity up against gravity but when distracted she could do that. Subjective diminished touch and pinprick sensation on the left side including the face and forehead and splitting of the midline. Splitting of the forehead to vibration Chin as well. Reflexes are symmetric. Plantars are  downgoing. Gait was not tested.   ASSESSMENT/PLAN [name] is a 34 y.o. female with history of clotting disorder, DVT, hypertension, DM-2, Epilepsy, Bipolar disorder, Schizophrenia, depression, CVA in the past x 2 09/2014, 04/2014 per pt., ?IVC filter, Congenital heart defect s/p valve repair?  presenting with slurred speech and left sided weakness. She did not receive IV t-PA due to delay in arrival.   Stroke:  Right frontal cortex and small left frontal cortex infarct. Appears embolic secondary to unknown source - Presence of a PFO presents a possible stroke etiology, though  unlikely given negative LE doppler. Hypercoagulable panel pending except homocystine 19.6 (H)  MRI  Right frontal cortex infarct. Small left frontal cortex.  MRA  Unremarkable   Carotid Doppler  Refused, pending, they will keep trying   2D Echo  No source of embolus   Lower extremity venous Dopplers negative  Hypercoagulable panel some tests remain pending.  Homo elevated.   TEE + PFO, + ASD  EEG pending   LDL 106  HgbA1c 5.7 in Aug  Lovenox 40 mg sq daily for VTE prophylaxis Diet regular Room service appropriate?: Yes; Fluid consistency:: Thin  clopidogrel 75 mg daily prior to admission, now on clopidogrel 75 mg daily. DO NOT RECOMMEND ANTICOAGULATION WITHOUT KNOWN EMBOLIC SOURCE  We do not recommend PFO/ASD closure as there is no clear association between its presence and stroke without known source.  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  History of schizophrenia  Mildly elevated creatinine  Elevated troponin  [facility]  Hospital day # 3  Stroke service will  sign off for now. Follow up in 6 weeks with Dr. Pearlean BrownieSethi  DAVID L Burnadette PeterINEHULS  [facility] See Amion for Pager information 03/02/2015 11:53 AM   Lesly DukesWILLIS,CHARLES KEITH Phone [phone]   To contact Stroke Continuity provider, please refer to [email or web address]. After hours, contact General Neurology

## 2015-03-02 NOTE — Progress Notes (Signed)
Occupational Therapy Treatment Patient Details Name: Tanya Mayer MRN: [id number] DOB: [birth year] Today's Date: 03/02/2015    History of present illness 34 y.o. 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 Imaging showed L frontal infact. Pt admitted with same symptoms in 8/16.  Pt has residual L weakness from this CVA but this weakness is worse and now has numbness.  pt found to have R and L frontal acute CVAs; TEE +PFO    OT comments  Pt progressing and performed ADLs in session. Will continue to follow acutely.  Follow Up Recommendations  Home health OT;Supervision/Assistance - 24 hour    Equipment Recommendations  Tub/shower seat    Recommendations for Other Services      Precautions / Restrictions Precautions Precautions: Fall Restrictions Weight Bearing Restrictions: No       Mobility Bed Mobility Overal bed mobility: Modified Independent                Transfers Overall transfer level: Needs assistance Equipment used: Rolling walker (2 wheeled) Transfers: Sit to/from Stand Sit to Stand: Min guard         General transfer comment: cue for hand placement    Balance    Min guard for safety while standing during functional task.                               ADL Overall ADL's : Needs assistance/impaired     Grooming: Applying deodorant;Oral care;Set up;Supervision/safety;Sitting;Standing   Upper Body Bathing: Set up;Sitting;Supervision/ safety   Lower Body Bathing: Min guard;Sit to/from stand     Upper Body Dressing Details (indicate cue type and reason): OT untied gown and pt able to feed arms through and also don tank top with no physical assist; performed sitting and adjusted shirt standing Lower Body Dressing: Minimal assistance;Sit to/from stand   Toilet Transfer: Min guard;Ambulation;RW;Regular Toilet;Grab bars   Toileting- Clothing Manipulation  and Hygiene: Min guard;Sit to/from stand       Functional mobility during ADLs: Min guard;Rolling walker General ADL Comments: Encouraged pt to use left hand during tasks.       Vision                     Perception     Praxis      Cognition  Awake/Alert Behavior During Therapy: WFL for tasks assessed/performed Overall Cognitive Status: Within Functional Limits for tasks assessed                       Extremity/Trunk Assessment               Exercises     Shoulder Instructions       General Comments      Pertinent Vitals/ Pain       Pain Assessment: 0-10 Pain Score:  (20) Pain Location: left side of body Pain Descriptors / Indicators: Stabbing;Shooting Pain Intervention(s): Monitored during session  Home Living                                          Prior Functioning/Environment              Frequency Min 3X/week     Progress Toward Goals  OT Goals(current goals  can now be found in the care plan section)  Progress towards OT goals: Progressing toward goals  Acute Rehab OT Goals Patient Stated Goal: not stated OT Goal Formulation: With patient Time For Goal Achievement: 03/15/15 Potential to Achieve Goals: Good ADL Goals Pt Will Perform Grooming: with min guard assist;standing Pt Will Perform Lower Body Bathing: with supervision;sit to/from stand Pt Will Perform Lower Body Dressing: with min assist;sit to/from stand Pt Will Perform Tub/Shower Transfer: with min guard assist;rolling walker;ambulating;shower seat;grab bars;Shower transfer Pt/caregiver will Perform Home Exercise Program: Increased strength;Left upper extremity;With written HEP provided Additional ADL Goal #1: pt will transfer to comfort commode and toilet with S and walker  Plan Discharge plan remains appropriate    Co-evaluation                 End of Session Equipment Utilized During Treatment: Gait belt;Rolling walker    Activity Tolerance Patient tolerated treatment well   Patient Left in bed;with bed alarm set;with family/visitor present;Other (comment) (nurse tech in room)   Nurse Communication  told tech that pt appeared to have her period.        Time: [id number] OT Time Calculation (min): 19 min  Charges: OT General Charges $OT Visit: 1 Procedure OT Treatments $Self Care/Home Management : 8-22 mins  Earlie RavelingStraub, Laneisha Mino L OTR/L [phone] 03/02/2015, 4:06 PM

## 2015-03-03 LAB — GLUCOSE, CAPILLARY
GLUCOSE-CAPILLARY: 81 mg/dL (ref 65–99)
GLUCOSE-CAPILLARY: 112 mg/dL — AB (ref 65–99)
GLUCOSE-CAPILLARY: 85 mg/dL (ref 65–99)
GLUCOSE-CAPILLARY: 125 mg/dL — AB (ref 65–99)

## 2015-03-03 LAB — BASIC METABOLIC PANEL
ANION GAP: 6 (ref 5–15)
BUN: 11 mg/dL (ref 6–20)
CO2: 25 mmol/L (ref 22–32)
Calcium: 9 mg/dL (ref 8.9–10.3)
Chloride: 103 mmol/L (ref 101–111)
Creatinine, Ser: 1.29 mg/dL — ABNORMAL HIGH (ref 0.44–1.00)
GFR calc Af Amer: >60 mL/min (ref >60)
GFR, EST NON AFRICAN AMERICAN: 53 mL/min — AB (ref >60)
Glucose, Bld: 103 mg/dL — ABNORMAL HIGH (ref 65–99)
POTASSIUM: 4.2 mmol/L (ref 3.5–5.1)
SODIUM: 134 mmol/L — AB (ref 135–145)

## 2015-03-03 NOTE — Progress Notes (Signed)
Family Medicine Teaching Service Daily Progress Note Intern Pager: [phone]  Patient name: Tanya Mayer Medical record number: [id number] Date of birth: 02-12-1981 Age: 34 y.o. Gender: female  Primary Care Provider: No primary care provider on file. Consultants: neurology, cardiology Code Status: full  Pt Overview and Major Events to Date:  10/19-admitted with stroke  Assessment and Plan: Shawanda Sievert is a 34 y.o. female presenting with slurred speech and left sided weakness . PMH is significant for clotting disorder, DVT, hypertension, DM-2, Epilepsy, Bipolar disorder, Schizophrenia, depression, CVA in the past x 2 ( 09/2014, 04/2014 per pt.), ?IVC filter, Congenital heart defect s/p valve repair? Per pt.   Hemiplegia and slurred speech secondary to ischemic stroke: Hemiplegia still present. Slurred speech resolved. ischemic stroke by MRI. CT head negative. MRI brain with acute infarct in the right frontal cortex 2.5 cm, small acute infarct left frontal cortex suggestive of emboli. MRA head negative. Carotid Dopplers Negative. TEE with R> L shunt ASD, PFO.  Hx of hypercoagulability, DVT, and IVC filter placement per patient but negative on KUB. She was on plavix and ASA prescribed by her physicians in [place]. Persistent left sided weakness / numbness. Some effort dependent neuro findings. Records from outside hospital in chart.  beta-2 GP, Anticardiolipin Abs, Protein C total & Antithrombin activity within normal limit. PT has signed off. neuro recs: Plavix 75 mg daily. No other anticoagulation at this time. Hypercoagulablity labs: so far negative, Homocysteine level elevated to 19.6. - started folic acid,  No PFO / ASD closure at this time for prevention of embolic CVA. Stroke team has signed off - Atorvastatin 40 mg daily - Continue Plavix 75 mg daily - Regular diet  Elevated troponin: initial troponin of 0.09>0.12>0.11. No complaint of chest pain. HDS. EKG with sinus rhythm and no sign of  ischemic changes. - on statin - Cards on board. No further ischemic workup at this time.   Hypertension: normotensive currently. On HCTZ at home - hold of her HCTZ now  DM-2: A1c 5.7 on 01/03/2015. On levemir 30 unit bid at home. - SSI  Diabetic neuropathy: on gabapentin 900 mg three times a day at home -400 mg three times a day due to renal funtion  Mildly elevated creatinine last 1.41.  B/l 1.2-1.3.  - BMET today.  - No fluids at this time.   Hx of DVT: Says she has IVC placed in 08/2014. Has hx of blood clot since she was young. Mother with hx of blood clot as well. Hypercoagulability panel normal. KUB negative for IVC filter. Elevated D-dimer -bl ext Venogram ordered  H/o Schizophrenia: on risperidone 0.25 mg and seroquel 300 mg at home. - continue home meds  H/o of Pseudoseizure: Records from [facility] hospital in [place] were reviewed for admission on 06/2014 and 04/2014. EEG negative for seizure activity per records at [facility] and Keppra was stopped.  - No Keppra   Asthma: stable now. On albuterol at home. Didn't use it within last week. Course sounds on lung exams -albuterol PRN  Social: pt moved to [facility] from [place] in August. She lives in shelter at [facility] with her husband. Denies domestic violence. She says her records are at [facility], and at [facility] in [place].   FEN/GI: heart healthy diet  Prophylaxis:  -Lovonex  Disposition: floor with tele   Subjective:  Patient states she slept well last night. Only complaints this morning are of right arm numbness and tingling. Patient denies any change in  weakness of her left upper and lower extremities. She also complains of a "knot" in her right ankle that she believes is a blood clot.  Her husband was in the room sleeping beside her.   Objective: Temp:  [97.6 F (36.4 C)-98.2 F (36.8 C)] 97.8 F (36.6 C) (10/23 0940) Pulse Rate:  [70-89] 79 (10/23 0940) Resp:  [16-20] 20 (10/23  0940) BP: (114-134)/(78-89) 124/78 mmHg (10/23 0940) SpO2:  [98 %-100 %] 100 % (10/23 0940) Physical Exam: Gen: AAOx3, appropriate.  Eyes: PERRLA, EOMI, sclera anicteric HEENT: NCAT, PERRLA, EOMI. No numbness or weakness / facial droop in her face today. Speech clear.  CV: RRR. S1 & S2 audible, no murmurs, rubs. Resp: no apparent WOB, CTAB. Abd: +BS. Soft, NDNT, no rebound or guarding.  Ext: No edema or gross deformities. Neuro: CN II-XII appear in tact except patient cannot raise eyebrows, weakness 3/5 on the left in both extremities with decreased sensation. Weakness is effort dependent.. Gait not assessed. No dysmetria. Cerebellar in tact.  Laboratory:  Recent Labs Lab 02/27/15 1014 02/27/15 1021  WBC 7.4  --   HGB 14.1 15.6*  HCT 43.0 46.0  PLT 177  --     Recent Labs Lab 02/27/15 1014 02/27/15 1021 02/28/15 0520 03/02/15 1330  NA 134* 137 135 136  K 4.3 4.2 3.6 4.2  CL 101 103 105 106  CO2 21*  --  25 22  BUN 19 23* 16 13  CREATININE 1.50* 1.50* 1.29* 1.41*  CALCIUM 9.5  --  8.6* 9.0  PROT 7.5  --   --   --   BILITOT 0.3  --   --   --   ALKPHOS 87  --   --   --   ALT 17  --   --   --   AST 26  --   --   --   GLUCOSE 114* 116* 102* 100*    MRI / MRA - bilateral Frontal lobe infarcts largest 2.5cm   Carotid doppler - negative for significant stenosis  LE Doppler - no DVT  KUB - no evidence of IVC.   TEE: PFO and ASD   Imaging/Diagnostic Tests: No results found.  Beaulah Dinninghristina M Daimion Adamcik, MD 03/03/2015, 11:46 AM PGY-1, [facility] FPTS Intern pager: [phone], text pages welcome

## 2015-03-03 NOTE — Progress Notes (Signed)
Patient c/o pain at "10", ultram given with mild effect. Patient with L side unable to defy gravity in both arm and leg. Patient also utilizing rolling walker and walking with profound limp. Patient weakness is transient.

## 2015-03-04 ENCOUNTER — Encounter (HOSPITAL_COMMUNITY): Payer: Self-pay | Admitting: Cardiovascular Disease

## 2015-03-04 ENCOUNTER — Ambulatory Visit (HOSPITAL_COMMUNITY): Payer: Medicaid Other

## 2015-03-04 LAB — BASIC METABOLIC PANEL
ANION GAP: 8 (ref 5–15)
BUN: 10 mg/dL (ref 6–20)
CALCIUM: 9 mg/dL (ref 8.9–10.3)
CO2: 22 mmol/L (ref 22–32)
CREATININE: 1.16 mg/dL — AB (ref 0.44–1.00)
Chloride: 106 mmol/L (ref 101–111)
GFR calc Af Amer: >60 mL/min (ref >60)
GLUCOSE: 95 mg/dL (ref 65–99)
Potassium: 5.1 mmol/L (ref 3.5–5.1)
Sodium: 136 mmol/L (ref 135–145)

## 2015-03-04 LAB — GLUCOSE, CAPILLARY
Glucose-Capillary: 94 mg/dL (ref 65–99)
Glucose-Capillary: 122 mg/dL — ABNORMAL HIGH (ref 65–99)
Glucose-Capillary: 90 mg/dL (ref 65–99)

## 2015-03-04 LAB — ANTINUCLEAR ANTIBODIES, IFA: ANTINUCLEAR ANTIBODIES, IFA: NEGATIVE

## 2015-03-04 MED ORDER — ATORVASTATIN CALCIUM 40 MG PO TABS: 40.0000 mg | ORAL_TABLET | Freq: Every day | ORAL | Status: DC

## 2015-03-04 MED ORDER — LIVING WELL WITH DIABETES BOOK
Freq: Once | Status: AC
  Administered 2015-03-04: 17:00:00
  Filled 2015-03-04: qty 1

## 2015-03-04 NOTE — Clinical Social Work Note (Signed)
CSW consult acknowledged:  Clinical Social Worker received consult in regards to patient needing an outpatient psychiatrist.   CSW met with patient and reviewed and provided outpatient psychiatrist resource for [facility]. Patient kindly accepted resources however later requested to meet with CSW again to explain that she does NOT need a psychiatrist and does NOT plan to follow up at [facility].   Patient stated "if I could get some rest, I would be much better". CSW encouraged patient to consider following up with [facility] once discharged. Patient repeatedly mentioned that she is unsure why the MD would suggest psychiatric resources.   Clinical Social Worker will sign off for now as social work intervention is no longer needed. Please consult [place] again if new need arises.  Glendon Axe, MSW, LCSWA [phone] 03/04/2015 3:29 PM

## 2015-03-04 NOTE — Discharge Summary (Signed)
Family Medicine Teaching [facility] Discharge Summary  Patient name: Tanya Mayer Medical record number: [id number] Date of birth: 1980-05-25 Age: 34 y.o. Gender: female Date of Admission: 02/27/2015  Date of Discharge: 03/04/2015 Admitting Physician: Tobey Grim, MD  Primary Care Provider: No primary care provider on file. Consultants: neurology, cardiology  Indication for Hospitalization: stroke  Discharge Diagnoses/Problem List:  Stroke  Disposition: shelter Geneticist, molecular)  Discharge Condition: improved  Discharge Exam: Gen: alert, answers question appropriately. Eyes: PERRLA, EOMI, sclera anicteric, no conjunctival injection Nares: clear, no erythema, swelling or congestion Oropharynx: clear, moist CV: RRR. S1 & S2 audible, no murmurs, rubs.. Resp: no apparent WOB, CTAB. Abd: +BS. Soft, NDNT, no rebound or guarding.  Ext: No edema or gross deformities. Neuro: slurred speech improved, answers questions appropriately, CN-V, VII & XI deficit on left side but improved from, motor 2/5 in LUE and LLE. However, there is some intentional component to it. Finger to nose normal on right. Not able assess on left. Plantar reflex-down going bilaterally. Knee reflex 1+ bilaterally.  Brief Hospital Course:  Tanya Mayer is a 34 y.o. female presenting with slurred speech and left sided weakness for about 10 hours. 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 She was on plavix and ASA prescribed by her physicians in [place] before she moved to [facility] in 12/2014.  Hemiplegia and slurred speech secondary to ischemic stroke: patient presented with left sided hemiplegia and slurred speech for about 10 hours. She reported having facial twitches about 24 hours prior to  presentation. Neurology and cardiology consulted and guided management. Blood pressure within normal range. CT head negative. MRI brain with acute infarct in the right frontal cortex 2.5 cm, small acute infarct left frontal cortex suggestive of emboli. MRA head negative. Carotid Dopplers Negative. Patient with subjunctive history of hypercoagulability, DVT and IVC placement which was not found on KUB. Her medical record from [facility] in [place] significant for pseudoseizure (negative EEG), bipolar disorder & schizophrenia but no mention of DVT, hypercoagulability or IVC placement. Hypercoagulable labs and LE venous doppler were negative here. However, her TEE with PFO and ASD, and right to left shunt. Cardiology to forward this finding to ASD/PFO closure team for possible PFO/ASD closure on outpatient basis.   On days of discharge, slurred speech and mental status improved significantly. Other neuro findings unchanged from presentation (see physical exam from the day of discharge). She also had some effort dependent neuro findings such as exaggerating her left sided weakness. She refused EEG and participation in physical therapy and occupational therapy while in house.   Patient was discharged on Plavix, Lipitor and other home medications.   Elevated troponin: q6 hrs troponin trend: 0.09, 0.12 and 0.11. No complaint of chest pain or shortness of breath. Patient hemodynamically stable. EKG with sinus rhythm and no sign of ischemic changes. Cardiology consulted and recommended no further ischemic workup.   History of pseudoseizure, bipolar disorder & schizophrenia: patient was continued on home risperidone 0.25 mg and seroquel 300 mg. She has no psychiatrist after she moved to [facility]. She declined a referral to outpatient psychiatry.  Other chronic conditions stable.  Issues for Follow Up:  1. Stroke: improvement of hemiplegia 2. Psychiatric condition: no psychiatrist here in [place]. Declined referral to  psychiatrist. 3. PFO/ASD: for possible closure surgery  Significant Procedures: none  Significant Labs and Imaging:   Recent Labs Lab 02/27/15 1014 02/27/15 1021  WBC 7.4  --   HGB 14.1 15.6*  HCT 43.0 46.0  PLT 177  --     Recent Labs Lab 02/27/15 1014 02/27/15 1021 02/28/15 0520 03/02/15 1330 03/03/15 1602 03/04/15 0450  NA 134* 137 135 136 134* 136  K 4.3 4.2 3.6 4.2 4.2 5.1  CL 101 103 105 106 103 106  CO2 21*  --  25 22 25 22   GLUCOSE 114* 116* 102* 100* 103* 95  BUN 19 23* 16 13 11 10   CREATININE 1.50* 1.50* 1.29* 1.41* 1.29* 1.16*  CALCIUM 9.5  --  8.6* 9.0 9.0 9.0  ALKPHOS 87  --   --   --   --   --   AST 26  --   --   --   --   --   ALT 17  --   --   --   --   --   ALBUMIN 3.7  --   --   --   --   --     Results/Tests Pending at Time of Discharge: none  Discharge Medications:    Medication List    TAKE these medications        acetaminophen 500 MG tablet  Commonly known as:  TYLENOL  Take 500 mg by mouth every 6 (six) hours as needed for moderate pain.     albuterol 108 (90 BASE) MCG/ACT inhaler  Commonly known as:  PROVENTIL HFA;VENTOLIN HFA  Inhale 2 puffs into the lungs every 6 (six) hours as needed for wheezing or shortness of breath.     atorvastatin 40 MG tablet  Commonly known as:  LIPITOR  Take 1 tablet (40 mg total) by mouth daily at 6 PM.     clopidogrel 75 MG tablet  Commonly known as:  PLAVIX  Take 1 tablet (75 mg total) by mouth daily.     gabapentin 300 MG capsule  Commonly known as:  NEURONTIN  Take 3 capsules (900 mg total) by mouth 3 (three) times daily.     hydrochlorothiazide 12.5 MG capsule  Commonly known as:  MICROZIDE  Take 12.5 mg by mouth daily.     hydrOXYzine 50 MG capsule  Commonly known as:  VISTARIL  Take 50 mg by mouth at bedtime.     polyethylene glycol packet  Commonly known as:  MIRALAX / GLYCOLAX  Take 17 g by mouth daily.     QUEtiapine 300 MG tablet  Commonly known as:  SEROQUEL  Take 1  tablet (300 mg total) by mouth at bedtime.     risperiDONE 0.25 MG tablet  Commonly known as:  RISPERDAL  Take 1 tablet (0.25 mg total) by mouth 2 (two) times daily.     trazodone 300 MG tablet  Commonly known as:  DESYREL  Take 1 tablet (300 mg total) by mouth at bedtime.        Discharge Instructions: Please refer to Patient Instructions section of EMR for full details.  Patient was counseled important signs and symptoms that should prompt return to medical care, changes in medications, dietary instructions, activity restrictions, and follow up appointments.   Follow-Up Appointments: Follow-up Information    Schedule an appointment as soon as possible for a visit with PCP.   Contact information:   With your PCP      Follow up with  [facility] On 03/28/2015.  Why:  Your appointment time is: 2:15 pm. Please bring a photo ID, insurance card, and list of medications to the appointment   Contact information:   [address] [place] [address]       Almon Hercules, MD 03/06/2015, 12:59 AM PGY-1, [facility]

## 2015-03-04 NOTE — Care Management Note (Addendum)
Case Management Note  Patient Details  Name: Jackquline BerlinLatonya Mittleman MRN: [id number] Date of Birth: 07/27/1980  Subjective/Objective:                    Action/Plan: Patient potentially being discharged today. [facility]. No PCP listed on facesheet. CM spoke with the patient and she states [place]. States that she has not followed up with Medicaid to obtain a PCP in this area. CM encouraged her to do so and in the interim obtained her an appointment [facility]. CM explained that she did not have to stay long term with the [facility] but it gave her a follow up appointment and gave her time to obtain a PCP with Medicaid. Patient and husband were in agreement with this plan. CM also explained to her and her husband [facility] as she attends the clinic. Bedside RN updated. Appointment placed on the AVS.  Expected Discharge Date:                  Expected Discharge Plan:  Homeless Shelter  In-House Referral:     Discharge planning Services  CM Consult  Post Acute Care Choice:    Choice offered to:     DME Arranged:    DME Agency:     HH Arranged:    HH Agency:     Status of Service:  In process, will continue to follow  Medicare Important Message Given:    Date Medicare IM Given:    Medicare IM give by:    Date Additional Medicare IM Given:    Additional Medicare Important Message give by:     If discussed at Long Length of Stay Meetings, dates discussed:    Additional Comments:  Kermit BaloKelli F Kathyleen Radice, RN 03/04/2015, 3:58 PM

## 2015-03-04 NOTE — Discharge Instructions (Signed)
It has been a pleasure taking care of you! You were admitted due to stroke likely from blood clot. After running numerous tests it is not clear why you had blood clot. We will be discharging you on some medications that you need to continue taking after you leave the hospital. We may have made some adjustments to your other medications. Please, make sure to read the directions before you take them. The names and directions on how to take these medications are found on this discharge paper under medication section.  You also need a follow up with your primary care doctor. The address, date and time are found on the discharge paper under follow up section.  Below, you can find some reading about stroke.  Take care,

## 2015-03-04 NOTE — Progress Notes (Signed)
Discharge orders received. Patient notified and requested to speak with MD regarding discharge. MD paged and aware. RN proceeded to review discharge papers with patient. Patient had multiple questions regarding discharge. Charge RN paged MD back. MD stated that they would be coming by to see patient. Patient refused to sign EMMI consent. Patient stated that she needed transportation back to [facility]. Charge RN paged on call CSW and received taxi voucher. Writer gave report to night RN. Awaiting MD. Will continue to monitor.

## 2015-03-04 NOTE — Progress Notes (Signed)
Family Medicine Teaching Service Daily Progress Note Intern Pager: [phone]  Patient name: Tanya Mayer Medical record number: [id number] Date of birth: 09/13/1980 Age: 34 y.o. Gender: female  Primary Care Provider: No primary care provider on file. Consultants: neurology, cardiology Code Status: full  Pt Overview and Major Events to Date:  10/19-admitted with stroke  Assessment and Plan: Tanya Mayer is a 34 y.o. female presenting with slurred speech and left sided weakness . PMH is significant for clotting disorder, DVT, hypertension, DM-2, Epilepsy, Bipolar disorder, Schizophrenia, depression, CVA in the past x 2 ( 09/2014, 04/2014 per pt.), ?IVC filter, Congenital heart defect s/p valve repair? Per pt.   Hemiplegia and slurred speech secondary to ischemic stroke: Hemiplegia still present. Slurred speech resolved. ischemic stroke by MRI. CT head negative. MRI brain with acute infarct in the right frontal cortex 2.5 cm, small acute infarct left frontal cortex suggestive of emboli. MRA head negative. Carotid Dopplers Negative. TEE with PFO and ASD, and R to L shunt.No PFO / ASD closure at this time for prevention of embolic CVA. Stroke team has signed off Hx of hypercoagulability & DVT but hypercoagulable labs and LE venous doppler negative. Also history of IVC filter placement per patient but negative on KUB. She was on plavix and ASA prescribed by her physicians in [place]. Persistent left sided weakness / numbness. Some effort dependent neuro findings. Records from outside hospital significant for pseudoseizure, malingering and psychiatric disorder.PT has signed off. neuro recs: Plavix 75 mg daily. No other anticoagulation at this time. Homocysteine level elevated to 19.6. Started folic acid, No PFO / ASD closure at this time for prevention of embolic CVA. Stroke team has signed off - Venogram for clot - Atorvastatin 40 mg daily - Continue Plavix 75 mg daily - Regular diet  Elevated  troponin: initial troponin of 0.09>0.12>0.11. No complaint of chest pain. HDS. EKG with sinus rhythm and no sign of ischemic changes. - on statin - Cards on board. No further ischemic workup at this time.   Hypertension: normotensive currently. On HCTZ at home - hold of her HCTZ now  DM-2: A1c 5.7 on 01/03/2015. On levemir 30 unit bid at home. - SSI  Diabetic neuropathy: on gabapentin 900 mg three times a day at home -400 mg three times a day due to renal funtion  Mildly elevated creatinine last 1.41.  B/l 1.2-1.3.  - BMET today.  - No fluids at this time.   Hx of DVT: Says she has IVC placed in 08/2014. Has hx of blood clot since she was young. Mother with hx of blood clot as well. Hypercoagulability panel normal. KUB negative for IVC filter. Elevated D-dimer -bl ext Venogram ordered  H/o Schizophrenia: on risperidone 0.25 mg and seroquel 300 mg at home. - continue home meds - Consult to social work to connect with psych here  H/o of Pseudoseizure: [facility] [place] were reviewed for admission on 06/2014 and 04/2014. EEG negative for seizure [facility] and Keppra was stopped.  - No Keppra   Asthma: stable now. On albuterol at home. Didn't use it within last week. Course sounds on lung exams -albuterol PRN  Social: [place] in August. She lives in shelter at [facility] with her husband. Denies domestic violence. She says [facility], and at [facility] in [place].   FEN/GI: heart healthy diet  Prophylaxis:  -Lovonex  Disposition: floor with tele  Subjective:  Complains of left arm and leg pain. Says she had pinch nerve in her back. Asked why her left arm is hurting. She says from stroke. She says tramadol doesn't help with pain. Asks for oxycodone.  Objective: Temp:  [97.5 F (36.4 C)-98.2 F (36.8 C)] 97.5 F (36.4 C) (10/24 0514) Pulse Rate:  [65-79] 75 (10/24  0514) Resp:  [20] 20 (10/24 0514) BP: (118-141)/(70-88) 141/88 mmHg (10/24 0514) SpO2:  [97 %-100 %] 100 % (10/24 0514) Physical Exam: Gen: alert, answers question appropriately. Eyes: PERRLA, EOMI, sclera anicteric, no conjunctival injection Nares: clear, no erythema, swelling or congestion Oropharynx: clear, moist CV: RRR. S1 & S2 audible, no murmurs, rubs.. Resp: no apparent WOB, CTAB. Abd: +BS. Soft, NDNT, no rebound or guarding.  Ext: No edema or gross deformities. Neuro: slurred speech improved, answers questions appropriately, CN-V, VII & XI deficit on left side but improved from, motor 2/5 in LUE and LLE. However, there is some intentional component to it. Finger to nose normal on right. Not able assess on left. Plantar reflex-down going bilaterally. Knee reflex 1+ bilaterally.  Laboratory:  Recent Labs Lab 02/27/15 1014 02/27/15 1021  WBC 7.4  --   HGB 14.1 15.6*  HCT 43.0 46.0  PLT 177  --     Recent Labs Lab 02/27/15 1014  02/28/15 0520 03/02/15 1330 03/03/15 1602  NA 134*  < > 135 136 134*  K 4.3  < > 3.6 4.2 4.2  CL 101  < > 105 106 103  CO2 21*  --  BUN 19  < > CREATININE 1.50*  < > 1.29* 1.41* 1.29*  CALCIUM 9.5  --  8.6* 9.0 9.0  PROT 7.5  --   --   --   --   BILITOT 0.3  --   --   --   --   ALKPHOS 87  --   --   --   --   ALT 17  --   --   --   --   AST 26  --   --   --   --   GLUCOSE 114*  < > 102* 100* 103*  < > = values in this interval not displayed.  MRI / MRA - bilateral Frontal lobe infarcts largest 2.5cm   Carotid doppler - negative for significant stenosis  LE Doppler - no DVT  KUB - no evidence of IVC.   TEE: PFO and ASD   Imaging/Diagnostic Tests: No results found.  Almon Hercules, MD 03/04/2015, 7:17 AM PGY-1, [facility] Family Medicine FPTS Intern pager: [phone], text pages welcome

## 2015-03-04 NOTE — Progress Notes (Signed)
Patient has refused EEG again this morning. This was the EEG departments 4th attempt to perform EEG and every time she refuses.

## 2015-03-04 NOTE — Progress Notes (Signed)
OT Cancellation Note  Patient Details Name: Tanya Mayer MRN: [id number] DOB: 02/26/1981   Cancelled Treatment:    Reason Eval/Treat Not Completed: Fatigue/lethargy limiting ability to participate. Attempted skilled OT with pt., she states she did not sleep all night and is tired.  Explained it is now 13:00 and Offered oob for lunch (tray was still in room un-touched), reviewed need and benefits for physical activity as it is noted pt. Wants to go home.  She states "you see i just wasn't planning on doing anything today, and i do not get oob much cause my muscles lock up and im not sure what they will do".  Explained to pt. Again and offered various options for skilled therapy.  Pt. Continued to decline stating "i dont think there is that much of a rush anyway cause i think they are still running tests on me so i have time".  Will check back as able.  Tanya Mayer, Tanya Mayer, COTA/L 03/04/2015, 2:19 PM

## 2015-03-05 DIAGNOSIS — I10 Essential (primary) hypertension: Secondary | ICD-10-CM

## 2015-03-05 LAB — FACTOR 5 LEIDEN

## 2015-03-06 LAB — PROTHROMBIN GENE MUTATION

## 2015-03-07 NOTE — Congregational Nurse Program (Signed)
Congregational Nurse Program Note  Date of Encounter: 03/05/2015  Past Medical History: Past Medical History  Diagnosis Date  . Diabetes mellitus without complication (HCC)   . Hypertension   . Stroke [facility](HCC)     hx of- left sided deficits    Encounter Details:

## 2015-03-19 ENCOUNTER — Inpatient Hospital Stay (HOSPITAL_COMMUNITY)
Admission: EM | Admit: 2015-03-19 | Discharge: 2015-03-22 | DRG: 065 | Disposition: A | Discharge status: Home / Self Care | Payer: Medicaid Other | Attending: Family Medicine | Admitting: Family Medicine

## 2015-03-19 ENCOUNTER — Emergency Department (HOSPITAL_COMMUNITY): Payer: Medicaid Other

## 2015-03-19 ENCOUNTER — Encounter (HOSPITAL_COMMUNITY): Payer: Self-pay

## 2015-03-19 DIAGNOSIS — E1140 Type 2 diabetes mellitus with diabetic neuropathy, unspecified: Secondary | ICD-10-CM | POA: Diagnosis present

## 2015-03-19 DIAGNOSIS — I639 Cerebral infarction, unspecified: Principal | ICD-10-CM | POA: Diagnosis present

## 2015-03-19 DIAGNOSIS — Z86718 Personal history of other venous thrombosis and embolism: Secondary | ICD-10-CM

## 2015-03-19 DIAGNOSIS — M545 Low back pain: Secondary | ICD-10-CM | POA: Diagnosis present

## 2015-03-19 DIAGNOSIS — Q211 Atrial septal defect: Secondary | ICD-10-CM

## 2015-03-19 DIAGNOSIS — R0989 Other specified symptoms and signs involving the circulatory and respiratory systems: Secondary | ICD-10-CM | POA: Diagnosis not present

## 2015-03-19 DIAGNOSIS — Z79899 Other long term (current) drug therapy: Secondary | ICD-10-CM

## 2015-03-19 DIAGNOSIS — F209 Schizophrenia, unspecified: Secondary | ICD-10-CM | POA: Diagnosis present

## 2015-03-19 DIAGNOSIS — R4781 Slurred speech: Secondary | ICD-10-CM | POA: Diagnosis present

## 2015-03-19 DIAGNOSIS — F1290 Cannabis use, unspecified, uncomplicated: Secondary | ICD-10-CM | POA: Diagnosis present

## 2015-03-19 DIAGNOSIS — F17210 Nicotine dependence, cigarettes, uncomplicated: Secondary | ICD-10-CM | POA: Diagnosis present

## 2015-03-19 DIAGNOSIS — R471 Dysarthria and anarthria: Secondary | ICD-10-CM | POA: Diagnosis present

## 2015-03-19 DIAGNOSIS — R0789 Other chest pain: Secondary | ICD-10-CM | POA: Diagnosis present

## 2015-03-19 DIAGNOSIS — R079 Chest pain, unspecified: Secondary | ICD-10-CM | POA: Diagnosis not present

## 2015-03-19 DIAGNOSIS — G8194 Hemiplegia, unspecified affecting left nondominant side: Secondary | ICD-10-CM | POA: Diagnosis present

## 2015-03-19 DIAGNOSIS — R52 Pain, unspecified: Secondary | ICD-10-CM | POA: Diagnosis not present

## 2015-03-19 DIAGNOSIS — F319 Bipolar disorder, unspecified: Secondary | ICD-10-CM | POA: Diagnosis present

## 2015-03-19 DIAGNOSIS — I10 Essential (primary) hypertension: Secondary | ICD-10-CM | POA: Diagnosis present

## 2015-03-19 DIAGNOSIS — G40909 Epilepsy, unspecified, not intractable, without status epilepticus: Secondary | ICD-10-CM | POA: Diagnosis present

## 2015-03-19 DIAGNOSIS — J45909 Unspecified asthma, uncomplicated: Secondary | ICD-10-CM | POA: Diagnosis present

## 2015-03-19 DIAGNOSIS — E785 Hyperlipidemia, unspecified: Secondary | ICD-10-CM | POA: Diagnosis present

## 2015-03-19 DIAGNOSIS — G8929 Other chronic pain: Secondary | ICD-10-CM | POA: Diagnosis present

## 2015-03-19 DIAGNOSIS — Z87891 Personal history of nicotine dependence: Secondary | ICD-10-CM | POA: Diagnosis not present

## 2015-03-19 DIAGNOSIS — Z8673 Personal history of transient ischemic attack (TIA), and cerebral infarction without residual deficits: Secondary | ICD-10-CM | POA: Diagnosis not present

## 2015-03-19 DIAGNOSIS — K219 Gastro-esophageal reflux disease without esophagitis: Secondary | ICD-10-CM | POA: Diagnosis present

## 2015-03-19 DIAGNOSIS — Z7902 Long term (current) use of antithrombotics/antiplatelets: Secondary | ICD-10-CM | POA: Diagnosis not present

## 2015-03-19 DIAGNOSIS — I63413 Cerebral infarction due to embolism of bilateral middle cerebral arteries: Secondary | ICD-10-CM

## 2015-03-19 DIAGNOSIS — Z590 Homelessness: Secondary | ICD-10-CM | POA: Diagnosis not present

## 2015-03-19 HISTORY — DX: Acute embolism and thrombosis of unspecified deep veins of unspecified lower extremity: I82.409

## 2015-03-19 HISTORY — DX: Schizophrenia, unspecified: F20.9

## 2015-03-19 HISTORY — DX: Migraine, unspecified, not intractable, without status migrainosus: G43.909

## 2015-03-19 HISTORY — DX: Unspecified asthma, uncomplicated: J45.909

## 2015-03-19 HISTORY — DX: Bipolar disorder, unspecified: F31.9

## 2015-03-19 HISTORY — DX: Reserved for concepts with insufficient information to code with codable children: IMO0002

## 2015-03-19 HISTORY — DX: Cerebral infarction, unspecified: I63.9

## 2015-03-19 HISTORY — DX: Anemia, unspecified: D64.9

## 2015-03-19 HISTORY — DX: Hyperlipidemia, unspecified: E78.5

## 2015-03-19 HISTORY — DX: Low back pain, unspecified: M54.50

## 2015-03-19 HISTORY — DX: Other chronic pain: G89.29

## 2015-03-19 HISTORY — DX: Patent foramen ovale: Q21.12

## 2015-03-19 HISTORY — DX: Cardiac murmur, unspecified: R01.1

## 2015-03-19 HISTORY — DX: Type 2 diabetes mellitus with diabetic peripheral angiopathy without gangrene: E11.51

## 2015-03-19 HISTORY — DX: Unspecified convulsions: R56.9

## 2015-03-19 HISTORY — DX: Gastro-esophageal reflux disease without esophagitis: K21.9

## 2015-03-19 HISTORY — DX: Atrial septal defect: Q21.1

## 2015-03-19 HISTORY — DX: Angina pectoris, unspecified: I20.9

## 2015-03-19 HISTORY — DX: Low back pain: M54.5

## 2015-03-19 HISTORY — DX: Type 2 diabetes mellitus with hyperglycemia: E11.65

## 2015-03-19 LAB — PROTIME-INR
INR: 1 (ref 0.00–1.49)
Prothrombin Time: 13.4 seconds (ref 11.6–15.2)

## 2015-03-19 LAB — COMPREHENSIVE METABOLIC PANEL
ALBUMIN: 3.3 g/dL — AB (ref 3.5–5.0)
ALK PHOS: 80 U/L (ref 38–126)
ALT: 19 U/L (ref 14–54)
ANION GAP: 11 (ref 5–15)
AST: 23 U/L (ref 15–41)
BILIRUBIN TOTAL: 0.6 mg/dL (ref 0.3–1.2)
BUN: 10 mg/dL (ref 6–20)
CO2: 25 mmol/L (ref 22–32)
Calcium: 9.2 mg/dL (ref 8.9–10.3)
Chloride: 104 mmol/L (ref 101–111)
Creatinine, Ser: 1.31 mg/dL — ABNORMAL HIGH (ref 0.44–1.00)
GFR calc Af Amer: >60 mL/min (ref >60)
GFR calc non Af Amer: 52 mL/min — ABNORMAL LOW (ref >60)
GLUCOSE: 89 mg/dL (ref 65–99)
POTASSIUM: 3.8 mmol/L (ref 3.5–5.1)
SODIUM: 140 mmol/L (ref 135–145)
Total Protein: 6.5 g/dL (ref 6.5–8.1)

## 2015-03-19 LAB — TROPONIN I: TROPONIN I: 0.06 ng/mL — AB (ref <0.031)

## 2015-03-19 LAB — CBC
HCT: 41.2 % (ref 36.0–46.0)
HEMOGLOBIN: 13.6 g/dL (ref 12.0–15.0)
MCH: 28 pg (ref 26.0–34.0)
MCHC: 33 g/dL (ref 30.0–36.0)
MCV: 84.9 fL (ref 78.0–100.0)
PLATELETS: 190 10*3/uL (ref 150–400)
RBC: 4.85 MIL/uL (ref 3.87–5.11)
RDW: 13.7 % (ref 11.5–15.5)
WBC: 4.2 10*3/uL (ref 4.0–10.5)

## 2015-03-19 LAB — APTT: APTT: 21 s — AB (ref 24–37)

## 2015-03-19 LAB — I-STAT TROPONIN, ED: TROPONIN I, POC: 0.05 ng/mL (ref 0.00–0.08)

## 2015-03-19 MED ORDER — RISPERIDONE 0.25 MG PO TABS
0.2500 mg | ORAL_TABLET | Freq: Two times a day (BID) | ORAL | Status: DC
  Administered 2015-03-19 – 2015-03-22 (×6): 0.25 mg via ORAL
  Filled 2015-03-19 (×7): qty 1

## 2015-03-19 MED ORDER — ACETAMINOPHEN 650 MG RE SUPP: 650.0000 mg | Freq: Four times a day (QID) | RECTAL | Status: DC | PRN

## 2015-03-19 MED ORDER — ATORVASTATIN CALCIUM 40 MG PO TABS
40.0000 mg | ORAL_TABLET | Freq: Every day | ORAL | Status: DC
  Administered 2015-03-19 – 2015-03-22 (×4): 40 mg via ORAL
  Filled 2015-03-19 (×4): qty 1

## 2015-03-19 MED ORDER — HEPARIN SODIUM (PORCINE) 5000 UNIT/ML IJ SOLN
5000.0000 [IU] | Freq: Three times a day (TID) | INTRAMUSCULAR | Status: DC
  Administered 2015-03-19 – 2015-03-22 (×8): 5000 [IU] via SUBCUTANEOUS
  Filled 2015-03-19 (×8): qty 1

## 2015-03-19 MED ORDER — ONDANSETRON HCL 4 MG/2ML IJ SOLN: 4.0000 mg | Freq: Four times a day (QID) | INTRAMUSCULAR | Status: DC | PRN

## 2015-03-19 MED ORDER — QUETIAPINE FUMARATE 300 MG PO TABS
300.0000 mg | ORAL_TABLET | Freq: Every day | ORAL | Status: DC
  Administered 2015-03-19 – 2015-03-21 (×3): 300 mg via ORAL
  Filled 2015-03-19: qty 6
  Filled 2015-03-19 (×4): qty 1

## 2015-03-19 MED ORDER — ACETAMINOPHEN 325 MG PO TABS
650.0000 mg | ORAL_TABLET | Freq: Four times a day (QID) | ORAL | Status: DC | PRN
  Administered 2015-03-21: 650 mg via ORAL
  Filled 2015-03-19: qty 2

## 2015-03-19 MED ORDER — ASPIRIN EC 81 MG PO TBEC
81.0000 mg | DELAYED_RELEASE_TABLET | Freq: Every day | ORAL | Status: DC
  Administered 2015-03-20 – 2015-03-22 (×3): 81 mg via ORAL
  Filled 2015-03-19 (×3): qty 1

## 2015-03-19 MED ORDER — SODIUM CHLORIDE 0.9 % IV SOLN
INTRAVENOUS | Status: DC
  Administered 2015-03-19: 20:00:00 via INTRAVENOUS

## 2015-03-19 MED ORDER — MORPHINE SULFATE (PF) 2 MG/ML IV SOLN
2.0000 mg | INTRAVENOUS | Status: DC | PRN
  Administered 2015-03-19 – 2015-03-21 (×6): 2 mg via INTRAVENOUS
  Administered 2015-03-21: 4 mg via INTRAVENOUS
  Administered 2015-03-22 (×3): 2 mg via INTRAVENOUS
  Filled 2015-03-19 (×7): qty 1
  Filled 2015-03-19: qty 2
  Filled 2015-03-19 (×3): qty 1

## 2015-03-19 MED ORDER — MORPHINE SULFATE (PF) 4 MG/ML IV SOLN
4.0000 mg | Freq: Once | INTRAVENOUS | Status: AC
  Administered 2015-03-19: 4 mg via INTRAVENOUS
  Filled 2015-03-19: qty 1

## 2015-03-19 MED ORDER — GABAPENTIN 300 MG PO CAPS
900.0000 mg | ORAL_CAPSULE | Freq: Three times a day (TID) | ORAL | Status: DC
  Administered 2015-03-19 – 2015-03-22 (×9): 900 mg via ORAL
  Filled 2015-03-19 (×10): qty 3

## 2015-03-19 MED ORDER — SODIUM CHLORIDE 0.9 % IJ SOLN
3.0000 mL | Freq: Two times a day (BID) | INTRAMUSCULAR | Status: DC
  Administered 2015-03-20: 3 mL via INTRAVENOUS

## 2015-03-19 MED ORDER — SENNA 8.6 MG PO TABS
1.0000 | ORAL_TABLET | Freq: Two times a day (BID) | ORAL | Status: DC
  Administered 2015-03-20 – 2015-03-22 (×4): 8.6 mg via ORAL
  Filled 2015-03-19 (×6): qty 1

## 2015-03-19 MED ORDER — HYDROCHLOROTHIAZIDE 12.5 MG PO CAPS
12.5000 mg | ORAL_CAPSULE | Freq: Every day | ORAL | Status: DC
  Filled 2015-03-19: qty 1

## 2015-03-19 MED ORDER — ONDANSETRON HCL 4 MG PO TABS: 4.0000 mg | ORAL_TABLET | Freq: Four times a day (QID) | ORAL | Status: DC | PRN

## 2015-03-19 MED ORDER — CLOPIDOGREL BISULFATE 75 MG PO TABS
75.0000 mg | ORAL_TABLET | Freq: Every day | ORAL | Status: DC
  Administered 2015-03-20 – 2015-03-22 (×3): 75 mg via ORAL
  Filled 2015-03-19 (×3): qty 1

## 2015-03-19 MED ORDER — TRAZODONE HCL 100 MG PO TABS
300.0000 mg | ORAL_TABLET | Freq: Every day | ORAL | Status: DC
  Administered 2015-03-19 – 2015-03-21 (×3): 300 mg via ORAL
  Filled 2015-03-19 (×3): qty 3

## 2015-03-19 MED ORDER — STROKE: EARLY STAGES OF RECOVERY BOOK
Freq: Once | Status: AC
  Administered 2015-03-19: 18:00:00
  Filled 2015-03-19: qty 1

## 2015-03-19 NOTE — ED Notes (Signed)
PT returned from x-ray. Pt monitored by pulse ox, bp cuff, and 5-lead.

## 2015-03-19 NOTE — ED Notes (Signed)
Family Medicine resident returned page and made aware of pt continued chest pain.  No further orders received at this time.

## 2015-03-19 NOTE — ED Notes (Signed)
Admitting MD at bedside.

## 2015-03-19 NOTE — Plan of Care (Signed)
Problem: Education: Goal: Knowledge of secondary prevention will improve Outcome: Progressing Stroke Booklet given

## 2015-03-19 NOTE — Consult Note (Signed)
CARDIOLOGY CONSULT NOTE   Patient ID: Tanya Mayer MRN: [id number], DOB/AGE: 35/12/1980   Admit date: 03/19/2015 Date of Consult: 03/19/2015 Reason for  Consult: Chest Pain   Primary Physician: No primary care provider on file. Primary Cardiologist: Dr. 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  She reports her chest pain feels like a "jack hammer" hitting her heart starting at 0700 this morning when she was eating breakfast. She reports the pain has been constant since. Reports receiving pain medicine in the ED which helped temporarily with her pain.   Her initial troponin was negative with cyclic cardiac enzymes pending. EKG shows no acute changes.  While in the ED, she reported worsening slurred speech since waking up this morning. An MRI was performed which showed an acute subcentimeter left frontal cortical infarct without hemorrhage which was adjacent to, but slightly different location from, the previous infarction noted in October.   Was seen by Cardiology during her hospitalization for CVA in 02/2015 and had TEE at that time to assess for possible intracardiac thrombus. A tiny ASD was noted during her procedure along with a hypermobile atrial septum with a large patent foramen ovale. Anticoagulation along with an ASD/PFO device closure was recommended at that time. It was unsure of how good a candidate she would be for anticoagulation secondary to her homelessness. She was referred to the ASD/PFO closure team for further management.   Still reports smoking 4-6 cigarettes per day and using marijuana occasionally.  Problem List  Past Medical  History  Diagnosis Date  . Diabetes mellitus without complication (HCC)   . Hypertension   . Stroke [facility](HCC)     hx of- left sided deficits    Past Surgical History  Procedure Laterality Date  . Heart valve repair as a baby    . Tee without cardioversion N/A 03/01/2015    Procedure: TRANSESOPHAGEAL ECHOCARDIOGRAM (TEE);  Surgeon: Thurmon FairMihai Croitoru, MD;  [facility];  Service: Cardiovascular;  Laterality: N/A;     Allergies  Allergies  Allergen Reactions  . Carrot [Daucus Carota] Anaphylaxis  . Eggs Or Egg-Derived Products Anaphylaxis and Swelling  . Mushroom Extract Complex Anaphylaxis      Inpatient Medications  .  stroke: mapping our early stages of recovery book   Does not apply Once  . aspirin EC  81 mg Oral Daily  . atorvastatin  40 mg Oral q1800  . clopidogrel  75 mg Oral Daily  . gabapentin  900 mg Oral TID  . heparin  5,000 Units Subcutaneous 3 times per day  . hydrochlorothiazide  12.5 mg Oral Daily  . QUEtiapine  300 mg Oral QHS  . risperiDONE  0.25 mg Oral BID  . senna  1 tablet Oral BID  . sodium chloride  3 mL Intravenous Q12H  . trazodone  300 mg Oral QHS    Family History Family History  Problem Relation Age of Onset  . Hypertension Mother   . Clotting disorder Mother     hypercoagulable disorder on mother side  . Heart attack Mother      Social History Social History   Social History  . Marital Status: Married    Spouse  Name: N/A  . Number of Children: N/A  . Years of Education: N/A   Occupational History  . Not on file.   Social History Main Topics  . Smoking status: Current Every Day Smoker -- 1.00 packs/day for 18 years  . Smokeless tobacco: Not on file  . Alcohol Use: No  . Drug Use: Yes    Special: Marijuana     Comment: occasional marijuana  . Sexual Activity: Not on file   Other Topics Concern  . Not on file   Social History Narrative     Review of Systems General:  No chills, fever, night sweats or weight  changes.  Cardiovascular:  No dyspnea on exertion, edema, orthopnea, palpitations, paroxysmal nocturnal dyspnea. Positive for chest pain.  Dermatological: No rash, lesions/masses Respiratory: No cough, dyspnea Urologic: No hematuria, dysuria Abdominal:   No nausea, vomiting, diarrhea, bright red blood per rectum, melena, or hematemesis Neurologic:  No visual changes, wkns, changes in mental status. Positive for slurred speech.  All other systems reviewed and are otherwise negative except as noted above.  Physical Exam Blood pressure 137/97, pulse 61, temperature 97.6 F (36.4 C), temperature source Oral, resp. rate 14, height 5' (1.524 m), weight 170 lb (77.111 kg), last menstrual period 03/12/2015, SpO2 100 %.  General: Pleasant, African American female in NAD.  Psych: Normal affect. Neuro: Alert and oriented X 3. Moves all extremities spontaneously. Slurred speech noted.  HEENT: Normal  Neck: Supple without bruits or JVD. Lungs:  Resp regular and unlabored, CTA without wheezing or rales. Heart: RRR no s3, s4, or murmurs. Abdomen: Soft, non-tender, non-distended, BS + x 4.  Extremities: No clubbing, cyanosis or edema. DP/PT/Radials 2+ and equal bilaterally.  Labs  No results for input(s): CKTOTAL, CKMB, TROPONINI in the last 72 hours. Lab Results  Component Value Date   WBC 4.2 03/19/2015   HGB 13.6 03/19/2015   HCT 41.2 03/19/2015   MCV 84.9 03/19/2015   PLT 190 03/19/2015    Recent Labs Lab 03/19/15 0939  NA 140  K 3.8  CL 104  CO2 25  BUN 10  CREATININE 1.31*  CALCIUM 9.2  PROT 6.5  BILITOT 0.6  ALKPHOS 80  ALT 19  AST 23  GLUCOSE 89   Lab Results  Component Value Date   CHOL 177 02/28/2015   HDL 40* 02/28/2015   LDLCALC 106* 02/28/2015   TRIG 156* 02/28/2015   Lab Results  Component Value Date   DDIMER 0.88* 03/02/2015    Radiology/Studies  Dg Chest 2 View: 03/19/2015  CLINICAL DATA:  34 year old female with left chest pain since yesterday afternoon  radiating to the arm and back. Initial encounter. EXAM: CHEST  2 VIEW COMPARISON:  02/27/2015, 01/03/2015. FINDINGS: Seated AP and lateral views of the chest. Mild cardiomegaly is stable. Mildly lower lung volumes. Other mediastinal contours are within normal limits. No pneumothorax, pulmonary edema or pleural effusion. No consolidation. Conjoined left fourth and fifth ribs. No acute pulmonary opacity. No acute osseous abnormality identified. IMPRESSION: Stable mild cardiomegaly.  No acute cardiopulmonary abnormality. Electronically Signed   By: Odessa Fleming M.D.   On: 03/19/2015 09:15   Mr Brain Wo Contrast (neuro Protocol): 03/19/2015  CLINICAL DATA:  LEFT-sided chest pain with slurred speech. History of stroke. Symptom onset earlier today. EXAM: MRI HEAD WITHOUT CONTRAST TECHNIQUE: Multiplanar, multiecho pulse sequences of the brain and surrounding structures were obtained without intravenous contrast. COMPARISON:  MR brain 02/27/2015.  No recent CT head. FINDINGS: Previously identified 2.5 cm  RIGHT frontal cortex infarction has now normalized, with only slight brain substance loss and residual gliosis. Tiny LEFT frontal cortical infarct noted previously, only a few mm in diameter, has also normalized. On diffusion imaging today, there is an acute subcentimeter LEFT frontal cortical infarct quite near where the previous insult was located. See image 32 series 4. This involves LEFT MCA territory and is nonhemorrhagic. No other areas of definite restricted diffusion are observed. No acute hemorrhage, mass lesion, hydrocephalus, or extra-axial fluid. Normal for age cerebral volume. No significant white matter disease. No foci of chronic hemorrhage. No midline abnormality. Extracranial soft tissues unremarkable. IMPRESSION: Acute subcentimeter LEFT frontal cortical infarct without hemorrhage. This is adjacent to, but slightly different location from, the previous infarction noted in October. Resolving RIGHT frontal  infarction which was acute in October as well. Electronically Signed   By: Elsie Stain M.D.   On: 03/19/2015 10:38   ECG: NSR with rate in 60's. No acute changes since previous tracing.   Transesophageal Echocardiogram: 03/01/2015 Study Conclusions  - Left ventricle: Systolic function was normal. The estimated ejection fraction was in the range of 55% to 60%. Wall motion was normal; there were no regional wall motion abnormalities. - Left atrium: No evidence of thrombus in the atrial cavity or appendage. - Right atrium: No evidence of thrombus in the atrial cavity or appendage. - Atrial septum: Agitated saline contrast study showed a large right-to-left atrial level shunt, in the baseline state. There is a very small secundum atrial septal defect with tiny left to right shunt. In addition, due to septal hypermobility, a patent foramen ovale allows brief, but large volume right to left shunting with each breath. - Pulmonary arteries: PA peak pressure: 35 mm Hg (S).  ASSESSMENT AND PLAN  1. Atypical Chest Pain - has been constant since 0700 this morning. Relieved with administration of pain medication while in the ED. - initial troponin negative. Cyclic cardiac enzymes are pending. - EKG without any acute ST or T wave changes consistent with ischemia.  - will obtain nuclear stress test tomorrow. NPO after midnight.  2.  Acute subcentimeter left frontal cortical infarct without hemorrhage  - noted on MRI on 03/19/2015 - recent CVA's in 2015, 12/2014, and 02/2015. - management per neurology and primary team - has known PFO and will be followed up with ASD/PFO closure team as outpatient.    Signed, Ellsworth Lennox, PA-C 03/19/2015, 4:33 PM Pager: [phone] Agree with note by Reita May  Asked to see pt with atyp CP. She was just in hospital 2 weeks ago with CVA. H/O repaired CHD. Has had mult CVAs in past with TEE demonstrated ASD with R--->L  shunt. Also has a poorly described hypercoagulable state.  Doubt CP cardiac. Enz neg. EKG w/o acute changes. Will get Lexiscan MV in AM. Will have structural heart team eval for PFO/ASD closure as an OP.  Runell Gess, M.D., FACP, [facility], [facility] [address] [place], [place]  [address]  [phone] 03/19/2015 7:08 PM

## 2015-03-19 NOTE — Consult Note (Signed)
Referring Physician: Radford PaxBeaton    Chief Complaint: Chest pain  HPI:                                                                                                                                         Tanya Mayer is an 34 y.o. female Tanya Mayer is an 34 y.o. female who lives in a homeless shelter. She was last seen in the hospital this September and then again in October for left sided weakness.   In september found to have 2 punctate strokes in right hemisphere. She was admitted but left AMA. She returned in october stating she has left sided weakness. On exam she was a poor historian, will speak clearly to start but then mumbles her words but again will speak clearly at the end of her sentence. She perseverated on the fact she has had multiple strokes. MRI of her brain showed bilateral frontal cortical acute ischemic infarctions at that time. At that time no AC was recommended due to no source of emboli found. In addition closure of PFO was not recommended as there is no clear association between its presence and stroke without known source. Today she present with another acute sub centimeter left frontal cortical infarct found as a incidental finding as patients main complaint was chest and left arm pain.     Recent work up in October included: Mr Brain Wo Contrast 02/27/2015  Acute infarct in the right frontal cortex 2.5 cm. Small acute infarct left frontal cortex. Findings are suggestive of emboli. Recommend evaluation for cardiac source of emboli.   Dg Chest Port 1 View 02/27/2015 Hypoinflation without evidence of acute cardiopulmonary disease.   2D Echocardiogram  - Left ventricle: The cavity size was normal. Systolic function wasnormal. The estimated ejection fraction was in the range of 60% to 65%. Wall motion was normal; there were no regional wallmotion abnormalities. - Mitral valve: There was mild regurgitation.  MRA head Negative intracranial MRA.  LE doppler  negative  TEE= + PFO, + ASD  Date last known well: Unable to determine Time last known well: Unable to determine tPA Given: No: mild symptoms     Past Medical History  Diagnosis Date  . Diabetes mellitus without complication (HCC)   . Hypertension   . Stroke [facility](HCC)     hx of- left sided deficits    Past Surgical History  Procedure Laterality Date  . Heart valve repair as a baby    . Tee without cardioversion N/A 03/01/2015    Procedure: TRANSESOPHAGEAL ECHOCARDIOGRAM (TEE);  Surgeon: Thurmon FairMihai Croitoru, MD;  [facility];  Service: Cardiovascular;  Laterality: N/A;    Family History  Problem Relation Age of Onset  . Hypertension Mother   . Clotting disorder Mother     hypercoagulable disorder on mother side  . Heart attack Mother    Social History:  reports that she has been  smoking.  She does not have any smokeless tobacco history on file. She reports that she uses illicit drugs (Marijuana). She reports that she does not drink alcohol.  Allergies:  Allergies  Allergen Reactions  . Carrot [Daucus Carota] Anaphylaxis  . Eggs Or Egg-Derived Products Anaphylaxis and Swelling  . Mushroom Extract Complex Anaphylaxis    Medications:                                                                                                                           No current facility-administered medications for this encounter.   Current Outpatient Prescriptions  Medication Sig Dispense Refill  . acetaminophen (TYLENOL) 500 MG tablet Take 500 mg by mouth every 6 (six) hours as needed for moderate pain.    Marland Kitchen albuterol (PROVENTIL HFA;VENTOLIN HFA) 108 (90 BASE) MCG/ACT inhaler Inhale 2 puffs into the lungs every 6 (six) hours as needed for wheezing or shortness of breath.    Marland Kitchen atorvastatin (LIPITOR) 40 MG tablet Take 1 tablet (40 mg total) by mouth daily at 6 PM. 30 tablet 0  . clopidogrel (PLAVIX) 75 MG tablet Take 1 tablet (75 mg total) by mouth daily. 10 tablet 0  .  gabapentin (NEURONTIN) 300 MG capsule Take 3 capsules (900 mg total) by mouth 3 (three) times daily. 90 capsule 0  . hydrochlorothiazide (MICROZIDE) 12.5 MG capsule Take 12.5 mg by mouth daily.    . hydrOXYzine (VISTARIL) 50 MG capsule Take 50 mg by mouth at bedtime.    . polyethylene glycol (MIRALAX / GLYCOLAX) packet Take 17 g by mouth daily.    . QUEtiapine (SEROQUEL) 300 MG tablet Take 1 tablet (300 mg total) by mouth at bedtime. 10 tablet 0  . risperiDONE (RISPERDAL) 0.25 MG tablet Take 1 tablet (0.25 mg total) by mouth 2 (two) times daily. 20 tablet 0  . trazodone (DESYREL) 300 MG tablet Take 1 tablet (300 mg total) by mouth at bedtime. 14 tablet 0     ROS:                                                                                                                                       History obtained from the patient  General ROS: negative for - chills, fatigue, fever, night sweats, weight gain or weight loss Psychological ROS: negative for - behavioral disorder, hallucinations, memory difficulties, mood swings or suicidal  ideation Ophthalmic ROS: negative for - blurry vision, double vision, eye pain or loss of vision ENT ROS: negative for - epistaxis, nasal discharge, oral lesions, sore throat, tinnitus or vertigo Allergy and Immunology ROS: negative for - hives or itchy/watery eyes Hematological and Lymphatic ROS: negative for - bleeding problems, bruising or swollen lymph nodes Endocrine ROS: negative for - galactorrhea, hair pattern changes, polydipsia/polyuria or temperature intolerance Respiratory ROS: negative for - cough, hemoptysis, shortness of breath or wheezing Cardiovascular ROS: negative for - chest pain, dyspnea on exertion, edema or irregular heartbeat Gastrointestinal ROS: negative for - abdominal pain, diarrhea, hematemesis, nausea/vomiting or stool incontinence Genito-Urinary ROS: negative for - dysuria, hematuria, incontinence or urinary  frequency/urgency Musculoskeletal ROS: negative for - joint swelling or muscular weakness Neurological ROS: as noted in HPI Dermatological ROS: negative for rash and skin lesion changes  Neurologic Examination:                                                                                                      Blood pressure 127/81, pulse 74, temperature 97.6 F (36.4 C), temperature source Oral, resp. rate 20, height 5' (1.524 m), weight 77.111 kg (170 lb), last menstrual period 03/12/2015, SpO2 100 %.  HEENT-  Normocephalic, no lesions, without obvious abnormality.  Normal external eye and conjunctiva.  Normal TM's bilaterally.  Normal auditory canals and external ears. Normal external nose, mucus membranes and septum.  Normal pharynx. Cardiovascular- S1, S2 normal, pulses palpable throughout   Lungs- chest clear, no wheezing, rales, normal symmetric air entry Abdomen- normal findings: bowel sounds normal Extremities- no edema Lymph-no adenopathy palpable Musculoskeletal-no joint tenderness, deformity or swelling Skin-warm and dry, no hyperpigmentation, vitiligo, or suspicious lesions  Neurological Examination Mental Status: Alert, oriented, thought content appropriate.  Speech dysarthric because patient does not move her mouth when speaking without evidence of aphasia.  Able to follow 3 step commands without difficulty. Cranial Nerves: II: Discs flat bilaterally; Visual fields grossly normal, pupils equal, round, reactive to light and accommodation III,IV, VI: ptosis not present, extra-ocular motions intact bilaterally V,VII: smile symmetric, facial light touch sensation normal bilaterally VIII: hearing normal bilaterally IX,X: uvula rises symmetrically XI: bilateral shoulder shrug XII: midline tongue extension Motor: Right : Upper extremity   5/5    Left:     Upper extremity   4/5  Lower extremity   5/5     Lower extremity   4/5 Tone and bulk:normal tone throughout; no atrophy  noted Sensory: Pinprick and light touch intact throughout, bilaterally Deep Tendon Reflexes: 2+ and symmetric throughout Plantars: Right: downgoing   Left: downgoing Cerebellar: normal finger-to-nose,  and normal heel-to-shin test Gait: not tested       Lab Results: Basic Metabolic Panel:  Recent Labs Lab 03/19/15 0939  NA 140  K 3.8  CL 104  CO2 25  GLUCOSE 89  BUN 10  CREATININE 1.31*  CALCIUM 9.2    Liver Function Tests:  Recent Labs Lab 03/19/15 0939  AST 23  ALT 19  ALKPHOS 80  BILITOT 0.6  PROT 6.5  ALBUMIN 3.3*  No results for input(s): LIPASE, AMYLASE in the last 168 hours. No results for input(s): AMMONIA in the last 168 hours.  CBC:  Recent Labs Lab 03/19/15 0940  WBC 4.2  HGB 13.6  HCT 41.2  MCV 84.9  PLT 190    Cardiac Enzymes: No results for input(s): CKTOTAL, CKMB, CKMBINDEX, TROPONINI in the last 168 hours.  Lipid Panel: No results for input(s): CHOL, TRIG, HDL, CHOLHDL, VLDL, LDLCALC in the last 168 hours.  CBG: No results for input(s): GLUCAP in the last 168 hours.  Microbiology: Results for orders placed or performed during the hospital encounter of 02/27/15  Urine culture     Status: None   Collection Time: 02/27/15 11:30 AM  Result Value Ref Range Status   Specimen Description URINE, RANDOM  Final   Special Requests NONE  Final   Culture MULTIPLE SPECIES PRESENT, SUGGEST RECOLLECTION  Final   Report Status 02/28/2015 FINAL  Final    Coagulation Studies:  Recent Labs  03/19/15 0939  LABPROT 13.4  INR 1.00    Imaging: Dg Chest 2 View  03/19/2015  CLINICAL DATA:  34 year old female with left chest pain since yesterday afternoon radiating to the arm and back. Initial encounter. EXAM: CHEST  2 VIEW COMPARISON:  02/27/2015, 01/03/2015. FINDINGS: Seated AP and lateral views of the chest. Mild cardiomegaly is stable. Mildly lower lung volumes. Other mediastinal contours are within normal limits. No pneumothorax,  pulmonary edema or pleural effusion. No consolidation. Conjoined left fourth and fifth ribs. No acute pulmonary opacity. No acute osseous abnormality identified. IMPRESSION: Stable mild cardiomegaly.  No acute cardiopulmonary abnormality. Electronically Signed   By: Odessa Fleming M.D.   On: 03/19/2015 09:15   Mr Brain Wo Contrast (neuro Protocol)  03/19/2015  CLINICAL DATA:  LEFT-sided chest pain with slurred speech. History of stroke. Symptom onset earlier today. EXAM: MRI HEAD WITHOUT CONTRAST TECHNIQUE: Multiplanar, multiecho pulse sequences of the brain and surrounding structures were obtained without intravenous contrast. COMPARISON:  MR brain 02/27/2015.  No recent CT head. FINDINGS: Previously identified 2.5 cm RIGHT frontal cortex infarction has now normalized, with only slight brain substance loss and residual gliosis. Tiny LEFT frontal cortical infarct noted previously, only a few mm in diameter, has also normalized. On diffusion imaging today, there is an acute subcentimeter LEFT frontal cortical infarct quite near where the previous insult was located. See image 32 series 4. This involves LEFT MCA territory and is nonhemorrhagic. No other areas of definite restricted diffusion are observed. No acute hemorrhage, mass lesion, hydrocephalus, or extra-axial fluid. Normal for age cerebral volume. No significant white matter disease. No foci of chronic hemorrhage. No midline abnormality. Extracranial soft tissues unremarkable. IMPRESSION: Acute subcentimeter LEFT frontal cortical infarct without hemorrhage. This is adjacent to, but slightly different location from, the previous infarction noted in October. Resolving RIGHT frontal infarction which was acute in October as well. Electronically Signed   By: Elsie Stain M.D.   On: 03/19/2015 10:38       Assessment and plan discussed with with attending physician and they are in agreement.    Felicie Morn PA-C [facility] [phone]  03/19/2015, 11:04 AM   Assessment: 34 y.o. female presenting to ED with chest pain and left arm pain. She for some reason had a MRI brain which shows a incidental punctate infarct on the left frontal lobe. Patient has had a extensive work up last month with no identifiable source of her CVA's.  Currently she has no new focal neurological  symptoms.  No need for further inpatient neurological work up at this moment.  Stroke Risk Factors - diabetes mellitus and hyperlipidemia   Recommend: 1) consider loop recorder if candidate.  2) no further stroke work up 3) address her chest pain as per primary team.   Patient seen and examined together with physician assistant and I concur with the assessment and plan.  Wyatt Portela, MD

## 2015-03-19 NOTE — ED Notes (Signed)
RN provided report to charge nurse on Tanya Mayer who reports that pt will be going to 5C-02.  Charge nurse on Tanya Mayer will provide report to [id number].

## 2015-03-19 NOTE — ED Provider Notes (Signed)
CSN: [id number]     Arrival date & time 03/19/15  0820 History   First MD Initiated Contact with Patient 03/19/15 0830     Chief Complaint  Patient presents with  . Chest Pain     (Consider location/radiation/quality/duration/timing/severity/associated sxs/prior Treatment) Patient is a 34 y.o. female presenting with chest pain. The history is provided by the patient.  Chest Pain Pain location:  L chest Pain quality: sharp   Pain radiates to the back: no   Duration:  11 hours Timing:  Constant Progression:  Worsening Chronicity:  New Context: at rest   Relieved by:  Nothing Worsened by:  Nothing tried Ineffective treatments:  None tried Associated symptoms: shortness of breath   Associated symptoms: no fever   Risk factors: prior DVT/PE    Patient is a 34 yo female with complicated PMH including recent admission last month for ischemic stroke, history of hypercoagulability and prior DVT, HTN, T2DM, schizophrenia, and seizure disorder who presents with chest pain and slurred speech. Patient reports that the chest pain started approximately 10 hours prior to arrival to the ED. Pain is located in the left side of her chest, and described as sharp "like a hammer or tool in my chest." Pain is constant in nature and radiates to the left side of her body. Additionally endorses some shortness of breath. Patient has not noticed anything that makes the pain better or worse. She tried taking ibuprofen which did not help the pain. No fevers, but does endorse some chills. No LE edema. No recent prolonged immobilization.   Patient additionally reports slurred speech since waking up this morning. She reports that the previous times she has had slurred speech like this is when she was having a stroke. Patient has residual left sided weakness from her prior strokes, thought denies any new weakness or numbness.   Past Medical History  Diagnosis Date  . Diabetes mellitus without complication (HCC)   .  Hypertension   . Stroke [facility])     hx of- left sided deficits   Past Surgical History  Procedure Laterality Date  . Heart valve repair as a baby    . Tee without cardioversion N/A 03/01/2015    Procedure: TRANSESOPHAGEAL ECHOCARDIOGRAM (TEE);  Surgeon: Thurmon Fair, MD;  Location: [facility] ENDOSCOPY;  Service: Cardiovascular;  Laterality: N/A;   Family History  Problem Relation Age of Onset  . Hypertension Mother   . Clotting disorder Mother     hypercoagulable disorder on mother side  . Heart attack Mother    Social History  Substance Use Topics  . Smoking status: Current Every Day Smoker -- 1.00 packs/day for 18 years  . Smokeless tobacco: None  . Alcohol Use: No   OB History    No data available     Review of Systems  Constitutional: Positive for chills. Negative for fever.  HENT: Negative.   Eyes: Negative.   Respiratory: Positive for shortness of breath.   Cardiovascular: Positive for chest pain.  Gastrointestinal: Negative.   Endocrine: Negative.   Genitourinary: Negative.   Musculoskeletal: Negative.   Skin: Negative.   Allergic/Immunologic: Negative.   Neurological: Positive for speech difficulty.  Hematological: Negative.   Psychiatric/Behavioral: Negative.       Allergies  Carrot; Eggs or egg-derived products; and Mushroom extract complex  Home Medications   Prior to Admission medications   Medication Sig Start Date End Date Taking? Authorizing Provider  acetaminophen (TYLENOL) 500 MG tablet Take 500 mg by mouth every 6 (six)  hours as needed for moderate pain.    Historical Provider, MD  albuterol (PROVENTIL HFA;VENTOLIN HFA) 108 (90 BASE) MCG/ACT inhaler Inhale 2 puffs into the lungs every 6 (six) hours as needed for wheezing or shortness of breath.    Historical Provider, MD  atorvastatin (LIPITOR) 40 MG tablet Take 1 tablet (40 mg total) by mouth daily at 6 PM. 03/04/15   Asiyah Mayra Reel, MD  clopidogrel (PLAVIX) 75 MG tablet Take 1 tablet (75 mg  total) by mouth daily. 01/18/15   Heather Laisure, PA-C  gabapentin (NEURONTIN) 300 MG capsule Take 3 capsules (900 mg total) by mouth 3 (three) times daily. 01/18/15   Heather Laisure, PA-C  hydrochlorothiazide (MICROZIDE) 12.5 MG capsule Take 12.5 mg by mouth daily.    Historical Provider, MD  hydrOXYzine (VISTARIL) 50 MG capsule Take 50 mg by mouth at bedtime.    Historical Provider, MD  polyethylene glycol (MIRALAX / GLYCOLAX) packet Take 17 g by mouth daily.    Historical Provider, MD  QUEtiapine (SEROQUEL) 300 MG tablet Take 1 tablet (300 mg total) by mouth at bedtime. 01/18/15   Heather Laisure, PA-C  risperiDONE (RISPERDAL) 0.25 MG tablet Take 1 tablet (0.25 mg total) by mouth 2 (two) times daily. 01/18/15   Heather Laisure, PA-C  trazodone (DESYREL) 300 MG tablet Take 1 tablet (300 mg total) by mouth at bedtime. 01/18/15   Heather Laisure, PA-C   BP 127/81 mmHg  Pulse 74  Temp(Src) 97.6 F (36.4 C) (Oral)  Resp 20  Ht 5' (1.524 m)  Wt 170 lb (77.111 kg)  BMI 33.20 kg/m2  SpO2 100%  LMP 03/12/2015 Physical Exam  Constitutional: She is oriented to person, place, and time. She appears well-developed.  HENT:  Head: Normocephalic and atraumatic.  Eyes: EOM are normal. Pupils are equal, round, and reactive to light.  Neck: Normal range of motion. Neck supple.  Cardiovascular: Normal rate, regular rhythm and intact distal pulses.   Pulmonary/Chest: Effort normal and breath sounds normal. No respiratory distress.  Abdominal: Soft. Bowel sounds are normal. She exhibits no distension. There is no tenderness.  Musculoskeletal: Normal range of motion. She exhibits no edema.  Neurological: She is alert and oriented to person, place, and time. No cranial nerve deficit.  Strength 5/5 throughout left side of body. 4+/5 on left side of body from prior CVAs. CN2-12 intact except for mild left side facial weakness. Slurred speech noted.   Skin: Skin is warm and dry.  Psychiatric: She has a normal mood  and affect.  Nursing note and vitals reviewed.   ED Course  Procedures (including critical care time) Labs Review Labs Reviewed  COMPREHENSIVE METABOLIC PANEL - Abnormal; Notable for the following:    Creatinine, Ser 1.31 (*)    Albumin 3.3 (*)    GFR calc non Af Amer 52 (*)    All other components within normal limits  CBC  PROTIME-INR  I-STAT TROPOININ, ED    Imaging Review Dg Chest 2 View  03/19/2015  CLINICAL DATA:  34 year old female with left chest pain since yesterday afternoon radiating to the arm and back. Initial encounter. EXAM: CHEST  2 VIEW COMPARISON:  02/27/2015, 01/03/2015. FINDINGS: Seated AP and lateral views of the chest. Mild cardiomegaly is stable. Mildly lower lung volumes. Other mediastinal contours are within normal limits. No pneumothorax, pulmonary edema or pleural effusion. No consolidation. Conjoined left fourth and fifth ribs. No acute pulmonary opacity. No acute osseous abnormality identified. IMPRESSION: Stable mild cardiomegaly.  No acute cardiopulmonary  abnormality. Electronically Signed   By: Odessa FlemingH  Hall M.D.   On: 03/19/2015 09:15   Mr Brain Wo Contrast (neuro Protocol)  03/19/2015  CLINICAL DATA:  LEFT-sided chest pain with slurred speech. History of stroke. Symptom onset earlier today. EXAM: MRI HEAD WITHOUT CONTRAST TECHNIQUE: Multiplanar, multiecho pulse sequences of the brain and surrounding structures were obtained without intravenous contrast. COMPARISON:  MR brain 02/27/2015.  No recent CT head. FINDINGS: Previously identified 2.5 cm RIGHT frontal cortex infarction has now normalized, with only slight brain substance loss and residual gliosis. Tiny LEFT frontal cortical infarct noted previously, only a few mm in diameter, has also normalized. On diffusion imaging today, there is an acute subcentimeter LEFT frontal cortical infarct quite near where the previous insult was located. See image 32 series 4. This involves LEFT MCA territory and is  nonhemorrhagic. No other areas of definite restricted diffusion are observed. No acute hemorrhage, mass lesion, hydrocephalus, or extra-axial fluid. Normal for age cerebral volume. No significant white matter disease. No foci of chronic hemorrhage. No midline abnormality. Extracranial soft tissues unremarkable. IMPRESSION: Acute subcentimeter LEFT frontal cortical infarct without hemorrhage. This is adjacent to, but slightly different location from, the previous infarction noted in October. Resolving RIGHT frontal infarction which was acute in October as well. Electronically Signed   By: Elsie StainJohn T Curnes M.D.   On: 03/19/2015 10:38   I have personally reviewed and evaluated these images and lab results as part of my medical decision-making.   EKG Interpretation   Date/Time:  Tuesday March 19 2015 08:30:18 EST Ventricular Rate:  68 PR Interval:  166 QRS Duration: 73 QT Interval:  391 QTC Calculation: 416 R Axis:   79 Text Interpretation:  Sinus rhythm Consider left ventricular hypertrophy  Confirmed by BEATON  MD, ROBERT ([id number]) on 03/19/2015 8:48:08 AM      MDM   Final diagnoses:  Cerebral infarction due to unspecified mechanism   9:38 AM Patient is a 34yo female with complicated medical history including recent admission for ischemia stroke who presents with slurred speech and chest pain. Patient is not in window for tPA since she woke up with slurred speech. Will obtain MR brain to rule out CVA. EKG with flattened t-waves in lateral leads, otherwise no signs of ischema. Will obtain istat troponin and CXR.   11:16 AM MRI with new acute left frontal stroke. ISTAT troponin negative. No acute findings on CXR. Neurology called, will see patient. Discussed case with [facility] resident, will accept patient for admission.    Ardith Darkaleb M Parker, MD 03/19/15 1119  Nelva Nayobert Beaton, MD 03/19/15 1125

## 2015-03-19 NOTE — Progress Notes (Signed)
Pt received from ED, Chest pain active. EKG obtained. Will continue to monitor

## 2015-03-19 NOTE — H&P (Signed)
Family Medicine Teaching [facility] Admission History and Physical Service Pager: [phone]  Patient name: Tanya Mayer Medical record number: [id number] Date of birth: Mar 05, 1981 Age: 34 y.o. Gender: female  Primary Care Provider: No primary care provider on file. Consultants: Neurology, Cardiology Code Status: Full  Chief Complaint: Slurred speech, worsening L-sided weakness, chest pain  Assessment and Plan: Tanya Mayer is a 34 y.o. female presenting with new acute L frontal cortical infarct without hemorrhage and atypical chest pain. 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  Acute L frontal CVA: Pt endorsing worsening of her slurred speech and L hemiparesis from baseline. MRI brain showing acute left frontal cortical infarct without hemorrhage that is adjacent to but at a slightly different location from the previous infarction in October. Some concern at previous hospitalization for hypercoagulable state due to MRI findings consistent with emboli and history of DVT, although labs were negative. TEE at previous admission showed PFO and ASD. Of note: Patient reports high compliance w/ prescribed medications. - Admit to telemetry under attending physician Dr. Lum Babe - Neurology saw her in the ED, did not recommend any additional neurological work-up. - Will get ECHO to rule out any vegetations or other possible causes of emboli - Continue Plavix and initiate ASA   - Last HgbA1c (02/2015): 5.7% - Last lipid panel (02/2015): Chol 177, TG 156, HDL 40, LDL 106. Continue Lipitor  qd. - Neuro checks every 2 hours, then every 4 hours - PT/OT/SLP  Atypical chest pain: Sharp, left-sided pain that started last night. Pain is reproducible on palpation. ACS is less likely, given the reproducibility of her pain, negative I-stat troponin, and EKG  showing NSR. Some concern for left upper extremity DVT, as Pt is very tender to palpation of her left upper extremity and left chest and she has a history of hypercoagulability. Other differentials include pericarditis, costochondritis, and stress-induced chest pain. Pulmonary causes such as pneumothorax and pneumonia are less likely, given her negative CXR. - Will trend troponins x 3.  - Cardiology consulted, appreciate recommendations. Will take her for a nuclear stress test in the am. NPO at midnight. - Cards will have structural heart team evaluate her for PFO/ASD closure as an outpatient. - ECHO as above - Will get left upper extremity dopplers to rule out DVT - Morphine 2-4mg  q4hrs PRN and Tylenol for pain - Cardiac monitoring  HTN: BPs ranging from 119-141/72-97 since admission. - Continue home med: HCTZ 12.5mg  qd  HLD: Last lipid panel (02/2015): Chol 177, TG 156, HDL 40, LDL 106. - Continue home med: Lipitor  qd  Schizophrenia/Bipolar disorder: Somewhat flattened affect on exam. - Continue home meds: Risperidone 0.25mg  bid, Seroquel  qd - Trazodone  for sleep   FEN/GI: Pt passed nursing swallow eval so on a heart healthy/carb-modified diet until NPO at midnight for stress test in the am. SLIV. Prophylaxis: sq heparin  Disposition: Home pending cardiac work-up.  History of Present Illness:  Tanya Mayer is a 34 y.o. female presenting with slurred speech that started this morning. She had a migraine last night when she laid down in bed. She took an Ibuprofen, but it didn't help. This morning while she was eating breakfast, noticed some weakness in the left side of her body. She went outside to smoke a cigarette and couldn't finish it because she was dizzy. She felt like her vision was going dark. She  noticed that she was slurring her speech much more than normal. All of the sudden she got nauseous. She went inside and asked the receptionist to call EMS and she was  taken here. She has weakness of her left arm and leg at baseline, but she notes that this has worsened today. She also endorses sharp, left-sided chest pain that started last night. She feels like "someone has a screwdriver in my arm and is twisting it". The pain is worse if someone touches it. The pain is not worse after a deep breath. She denies any trauma to the left chest or any recent falls. At baseline, she uses a walker and can get around pretty well.  In October 2016, she was hospitalized for left sided hemiplegia and slurred speech. CT head was negative. MRI brain showed acute infarct in the right frontal cortex measuring 2.5cm and a small acute infarct of the left frontal cortex, findings suggestive of emboli. MRA head was negative. Carotid dopplers were negative. Hypercoagulable labs and LE venous doppler were negative. TEE showed PFO and ASD with right to left shunt. She was supposed to have this closed on an outpatient basis. She refused PT and OT.  In the ED, MRI showed acute left frontal cortical infarct without hemorrhage. This is adjacent to but slightly different location from the previous infarction in October. Neurology was consulted and do not recommend any further inpatient neurological workup at this time. For her chest pain, I-stat troponin was negative. EKG was performed and showed NSR. CXR was negative for acute pathology. She was admitted for further management.  Review Of Systems: Per HPI with the following additions: +headache, -changes in vision, -abdominal pain, -dysuria, -bowel/bladder incontinence. Otherwise the remainder of the systems were negative.  Patient Active Problem List   Diagnosis Date Noted  . CVA (cerebral infarction) 03/19/2015  . UTI (lower urinary tract infection)   . Cryptogenic stroke (HCC)   . Type 2 diabetes mellitus with diabetic neuropathy, with long-term current use of insulin (HCC)   . Acute thromboembolic cerebrovascular accident (CVA) (HCC)    . PFO (patent foramen ovale)   . 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 Schizophrenia (HCC)   . Bipolar I disorder (HCC)   . Seizure disorder (HCC)   . Acute encephalopathy 02/27/2015  . Stroke (HCC) 01/03/2015  . Essential hypertension 01/03/2015  . DM (diabetes mellitus) type II controlled, neurological manifestation (HCC) 01/03/2015  . Hyperlipidemia 01/03/2015  . Hemiparesis affecting left side as late effect of cerebrovascular accident (HCC) 01/03/2015  . Tobacco abuse 01/03/2015  . Marijuana abuse 01/03/2015  . Asthma 01/03/2015  . History of DVT of lower extremity 01/03/2015    Past Medical History: Past Medical History  Diagnosis Date  . Diabetes mellitus without complication (HCC)   . Hypertension   . Stroke [facility](HCC)     hx of- left sided deficits    Past Surgical History: Past Surgical History  Procedure Laterality Date  . Heart valve repair as a baby    . Tee without cardioversion N/A 03/01/2015    Procedure: TRANSESOPHAGEAL ECHOCARDIOGRAM (TEE);  Surgeon: Thurmon FairMihai Croitoru, MD;  [facility];  Service: Cardiovascular;  Laterality: N/A;    Social History: Social History  Substance Use Topics  . Smoking status: Current Every Day Smoker -- 1.00 packs/day for 18 years  . Smokeless tobacco: None  . Alcohol Use: No   Additional social history: [facility] Moving  to a new apartment soon. Smokes marijuana a couple times per month. Last smoked on Friday. Has three children, but they do not live with her. Please also refer to relevant sections of EMR.  Family History: Family History  Problem Relation Age of Onset  . Hypertension Mother   . Clotting disorder Mother     hypercoagulable disorder on mother side  . Heart attack Mother    Maternal grandmother- asthma, HTN Paternal aunt- T2DM Mother- passed away from breast cancer at age 79. Father- healthy 3 children-  healthy  Allergies and Medications: Allergies  Allergen Reactions  . Carrot [Daucus Carota] Anaphylaxis  . Eggs Or Egg-Derived Products Anaphylaxis and Swelling  . Mushroom Extract Complex Anaphylaxis   No current facility-administered medications on file prior to encounter.   Current Outpatient Prescriptions on File Prior to Encounter  Medication Sig Dispense Refill  . albuterol (PROVENTIL HFA;VENTOLIN HFA) 108 (90 BASE) MCG/ACT inhaler Inhale 2 puffs into the lungs every 6 (six) hours as needed for wheezing or shortness of breath.    Marland Kitchen atorvastatin (LIPITOR) 40 MG tablet Take 1 tablet (40 mg total) by mouth daily at 6 PM. 30 tablet 0  . clopidogrel (PLAVIX) 75 MG tablet Take 1 tablet (75 mg total) by mouth daily. 10 tablet 0  . gabapentin (NEURONTIN) 300 MG capsule Take 3 capsules (900 mg total) by mouth 3 (three) times daily. 90 capsule 0  . hydrochlorothiazide (MICROZIDE) 12.5 MG capsule Take 12.5 mg by mouth daily.    . hydrOXYzine (VISTARIL) 50 MG capsule Take 50 mg by mouth at bedtime.    Marland Kitchen QUEtiapine (SEROQUEL) 300 MG tablet Take 1 tablet (300 mg total) by mouth at bedtime. 10 tablet 0  . risperiDONE (RISPERDAL) 0.25 MG tablet Take 1 tablet (0.25 mg total) by mouth 2 (two) times daily. 20 tablet 0  . trazodone (DESYREL) 300 MG tablet Take 1 tablet (300 mg total) by mouth at bedtime. 14 tablet 0  . acetaminophen (TYLENOL) 500 MG tablet Take 500 mg by mouth every 6 (six) hours as needed for moderate pain.      Objective: BP 131/82 mmHg  Pulse 61  Temp(Src) 97.6 F (36.4 C) (Oral)  Resp 20  Ht 5' (1.524 m)  Wt 170 lb (77.111 kg)  BMI 33.20 kg/m2  SpO2 97%  LMP 03/12/2015 Exam: General: A/O, Laying in bed, in NAD, speech is slurred. HEENT: /AT, EOMI, pinpoint pupils, no scleral icterus, dry mucous membranes Neck: Supple Cardiovascular: RRR, no murmurs, rubs, or gallops, did not appreciate subclavian bruit Respiratory: CTAB, normal work of breathing, no wheezes Abdomen:  +BS, soft, non-tender, non-distended MSK: No edema in the lower extremities bilaterally Skin: Well-healed scar noted on right upper back, no rash Neuro: Awake, alert, oriented; muscle strength 5/5 in the right upper and lower extremity, 1/5 in the left upper and lower extremity; 1+ reflexes equal bilaterally, no clonus, able to move tongue side to side, bilateral facial weakness noted during smiling Psych: Affect is somewhat flattened, appropriate behavior.  Labs and Imaging: CBC BMET   Recent Labs Lab 03/19/15 0940  WBC 4.2  HGB 13.6  HCT 41.2  PLT 190    Recent Labs Lab 03/19/15 0939  NA 140  K 3.8  CL 104  CO2 25  BUN 10  CREATININE 1.31*  GLUCOSE 89  CALCIUM 9.2     i-stat trop 0.05 PT 13.4, INR 1.00  CXR (11/8): Stable mild cardiomegaly, no acute cardiopulmonary abnormality. MRI brain (11/8): Acute subcentimeter left  frontal cortical infarct without hemorrhage. This is adjacent to, but slightly different location from, the previous infarct in Oct. Resolving R frontal infarct which was acute in Oct as well. EKG (11/8): NSR   Campbell Stall, MD 03/19/2015, 3:33 PM PGY-1, [facility] FPTS Intern pager: [phone], text pages welcome   Upper Level Addendum:  I have seen and evaluated this patient along with Dr. Nancy Marus and reviewed the above note, making necessary revisions in red.   Kathee Delton, MD,MS,  PGY2 03/19/2015 8:25 PM

## 2015-03-19 NOTE — ED Notes (Addendum)
[facility] brought by [facility].  Pt reports left sided chest pain that began yesterday afternoon.  Pt reports the pain radiates from left chest to left arm, right leg and lower back.  Pain worsens with palpation and deep breathing.  Pt reports mild SOB as well.  Pt is in NAD at this time and is able to speak in complete sentences.

## 2015-03-19 NOTE — ED Notes (Signed)
Pt monitored by pulse ox, bp cuff, and 5-lead. 

## 2015-03-19 NOTE — ED Notes (Signed)
Admitting  MD paged to be made aware of pt chest pain.

## 2015-03-20 ENCOUNTER — Inpatient Hospital Stay (HOSPITAL_COMMUNITY): Payer: Medicaid Other

## 2015-03-20 ENCOUNTER — Telehealth: Payer: Self-pay | Admitting: Physician Assistant

## 2015-03-20 DIAGNOSIS — E785 Hyperlipidemia, unspecified: Secondary | ICD-10-CM

## 2015-03-20 DIAGNOSIS — R079 Chest pain, unspecified: Secondary | ICD-10-CM

## 2015-03-20 DIAGNOSIS — I639 Cerebral infarction, unspecified: Principal | ICD-10-CM

## 2015-03-20 DIAGNOSIS — Q211 Atrial septal defect: Secondary | ICD-10-CM

## 2015-03-20 DIAGNOSIS — R52 Pain, unspecified: Secondary | ICD-10-CM | POA: Insufficient documentation

## 2015-03-20 LAB — NM MYOCAR MULTI W/SPECT W/WALL MOTION / EF
CHL CUP MPHR: 185 {beats}/min
CHL CUP RESTING HR STRESS: 58 {beats}/min
CHL RATE OF PERCEIVED EXERTION: 64
CSEPEDS: 21 s
CSEPEW: 1 METS
CSEPHR: 185 %
Exercise duration (min): 5 min
Peak HR: 120 {beats}/min

## 2015-03-20 LAB — BASIC METABOLIC PANEL
Anion gap: 9 (ref 5–15)
BUN: 12 mg/dL (ref 6–20)
CALCIUM: 8.6 mg/dL — AB (ref 8.9–10.3)
CO2: 24 mmol/L (ref 22–32)
Chloride: 104 mmol/L (ref 101–111)
Creatinine, Ser: 1.21 mg/dL — ABNORMAL HIGH (ref 0.44–1.00)
GFR calc Af Amer: >60 mL/min (ref >60)
GFR, EST NON AFRICAN AMERICAN: 57 mL/min — AB (ref >60)
GLUCOSE: 130 mg/dL — AB (ref 65–99)
Potassium: 4 mmol/L (ref 3.5–5.1)
Sodium: 137 mmol/L (ref 135–145)

## 2015-03-20 LAB — CBC
HCT: 37 % (ref 36.0–46.0)
Hemoglobin: 11.7 g/dL — ABNORMAL LOW (ref 12.0–15.0)
MCH: 27.3 pg (ref 26.0–34.0)
MCHC: 31.6 g/dL (ref 30.0–36.0)
MCV: 86.4 fL (ref 78.0–100.0)
Platelets: 174 10*3/uL (ref 150–400)
RBC: 4.28 MIL/uL (ref 3.87–5.11)
RDW: 14.1 % (ref 11.5–15.5)
WBC: 4 10*3/uL (ref 4.0–10.5)

## 2015-03-20 LAB — RAPID URINE DRUG SCREEN, HOSP PERFORMED
Amphetamines: NOT DETECTED
BARBITURATES: NOT DETECTED
BENZODIAZEPINES: NOT DETECTED
COCAINE: NOT DETECTED
OPIATES: POSITIVE — AB
Tetrahydrocannabinol: NOT DETECTED

## 2015-03-20 LAB — D-DIMER, QUANTITATIVE: D-Dimer, Quant: 1.04 ug/mL-FEU — ABNORMAL HIGH (ref 0.00–0.48)

## 2015-03-20 LAB — GLUCOSE, CAPILLARY: Glucose-Capillary: 128 mg/dL — ABNORMAL HIGH (ref 65–99)

## 2015-03-20 LAB — TROPONIN I
Troponin I: 0.06 ng/mL — ABNORMAL HIGH (ref <0.031)
Troponin I: 0.07 ng/mL — ABNORMAL HIGH (ref <0.031)

## 2015-03-20 LAB — PREGNANCY, URINE: Preg Test, Ur: NEGATIVE

## 2015-03-20 MED ORDER — REGADENOSON 0.4 MG/5ML IV SOLN
INTRAVENOUS | Status: AC
  Filled 2015-03-20: qty 5

## 2015-03-20 MED ORDER — LIDOCAINE HCL (PF) 1 % IJ SOLN
5.0000 mL | Freq: Once | INTRAMUSCULAR | Status: DC
  Filled 2015-03-20: qty 30

## 2015-03-20 MED ORDER — TECHNETIUM TC 99M SESTAMIBI GENERIC - CARDIOLITE
30.0000 | Freq: Once | INTRAVENOUS | Status: AC | PRN
  Administered 2015-03-20: 30 via INTRAVENOUS

## 2015-03-20 MED ORDER — NICOTINE 14 MG/24HR TD PT24
14.0000 mg | MEDICATED_PATCH | Freq: Every day | TRANSDERMAL | Status: DC
  Filled 2015-03-20 (×2): qty 1

## 2015-03-20 MED ORDER — TECHNETIUM TC 99M SESTAMIBI GENERIC - CARDIOLITE
10.0000 | Freq: Once | INTRAVENOUS | Status: AC | PRN
  Administered 2015-03-20: 10 via INTRAVENOUS

## 2015-03-20 MED ORDER — REGADENOSON 0.4 MG/5ML IV SOLN
0.4000 mg | Freq: Once | INTRAVENOUS | Status: DC
  Filled 2015-03-20: qty 5

## 2015-03-20 NOTE — Telephone Encounter (Signed)
Pt scheduled for outpatient PFO consult on 03/29/15 with Dr Excell Seltzerooper. I spoke with the pt's nurse on [address](Jessica) and made her aware that appointment has been made in [facility] system.

## 2015-03-20 NOTE — Care Management Note (Addendum)
Case Management Note  Patient Details  Name: Jackquline BerlinLatonya Plante MRN: [id number] Date of Birth: 06/28/1980  Subjective/Objective:     34 year old female w/ PMH of DM2, CVA 3 (2015, 12/2014, 02/2015), epilepsy, schizophrenia, depression, bipolar disorder,  and homelessness who [facility] for evaluation of chest pain. MRI Positive for Acute subcentimeter LEFT frontal cortical infarct without hemorrhage. Stress test 03-20-15.  Action/Plan: CM did attempt to visit with pt in regards to disposition needs. Pt in stress test @ time of visit. Per Staff RN [facility] stays [facility] in [place]. CM will continue to monitor for additional needs.    Expected Discharge Date:                  Expected Discharge Plan:  Homeless Shelter  In-House Referral:  Clinical Social Work  Discharge planning Services  CM Consult  Post Acute Care Choice:    Choice offered to:     DME Arranged:    RW with seat DME Agency:   [facility]  HH Arranged:    HH Agency:     Status of Service: Completed.  Medicare Important Message Given:    Date Medicare IM Given:    Medicare IM give by:    Date Additional Medicare IM Given:    Additional Medicare Important Message give by:     If discussed at Long Length of Stay Meetings, dates discussed:   Additional Comments:  1600 03-22-15 Tomi BambergerBrenda Graves-Bigelow, RN,BSN [phone] CM did speak with CSW- in regards to disposition needs-CM did call [facility] and spoke to Bang[place]inda. Per Pryor Ochoaolinda pt will be able to stay [facility] the day since CVA- pt will only need a note. CM did speak with Dr Wende MottMcKeag in reference to disposition needs and note that needs to be written. Family Medicine feels like pt will benefit from SNF. Pt will be a level 2 Passar if SNF is needed. CM did speak to PT and pt walked 250 ft. Pt has refused services to be set [facility]. CM did make MD aware that if she is d/c they will  need to follow her outpatient for PCP needs. They can then write HH orders since she will be in apartment by next Friday. CM did call several agencies and they will not go to a shelter for services. No HH will be set up today. Pt and boyfriend were given a bus pass for transportation. CM also provided pt with a RW. No further services from CM at this time.      03-22-15 [address] Hubert Derstine Graves-Bigelow, [place] [phone]  CM did speak with pt in regards to disposition needs. PT to reevaluate for disposition needs. Pt states she would like a RW with seat. CM did reorder DME. [facility] to drop off Rollator Walker. No further needs at this time.    03-22-15 [address] Raye Wiens Graves-Bigelow, [place] [phone] CM did speak with [facility] Liaison and Medicaid number is still active. Pt had mentioned that she has BCBS card. CM did ask pt to produce card if she has it. No further needs from CM at this time.    1033 03-21-15 Tomi BambergerBrenda Graves-Bigelow, RN,BSN [phone] CM did try to speak with pt- she was asleep. Per friend in the room they have a place to d/c to once stable. Friend states they have an apartment and will be signing lease soon. No transportation at this time. CM did  offer bus pass for both. CM will provide passes. No further needs at this time.   Gala Lewandowsky, RN 03/20/2015, 12:16 PM

## 2015-03-20 NOTE — Progress Notes (Signed)
Nuc was normal. I have asked nurse to make patient and primary team aware. I have sent a message to our [facility] office's scheduler requesting outpatient consult with PFO closure team, and our office will call the patient with this information. Hoby Kawai PA-C

## 2015-03-20 NOTE — Progress Notes (Signed)
Patient Name: Tanya Mayer Date of Encounter: 03/20/2015  Active Problems:   Stroke [facility])   CVA (cerebral infarction)   Chest pain   Cerebral infarction due to unspecified mechanism   Primary Cardiologist: Dr. 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  SUBJECTIVE: Reports having continued chest pain and back pain constantly throughout the night. Reports the pain was so bad she could not sleep, yet was sleeping upon entering the room and was difficult to awake from sleep at time of examination.   OBJECTIVE Filed Vitals:   03/19/15 2318 03/20/15 0035 03/20/15 0236 03/20/15 0435  BP:  106/64 92/48 104/68  Pulse:      Temp: 98.2 F (36.8 C)   98.5 F (36.9 C)  TempSrc: Oral   Oral  Resp:      Height:      Weight:    165 lb 6.4 oz (75.025 kg)  SpO2:        Intake/Output Summary (Last 24 hours) at 03/20/15 0802 Last data filed at 03/20/15 0704  Gross per 24 hour  Intake    480 ml  Output   1300 ml  Net   -820 ml   Filed Weights   03/19/15 0831 03/19/15 1640 03/20/15 0435  Weight: 170 lb (77.111 kg) 169 lb 8 oz (76.885 kg) 165 lb 6.4 oz (75.025 kg)    PHYSICAL EXAM General: Well developed, well nourished, female in no acute distress. Head: Normocephalic, atraumatic.  Neck: Supple without bruits, JVD not elevated. Lungs:  Resp regular and unlabored, CTA without wheezing or rales. Heart: RRR, S1, S2, no S3, S4, or murmur; no rub. Abdomen: Soft, non-tender, non-distended with normoactive bowel sounds. No hepatomegaly. No rebound/guarding. No obvious abdominal masses. Extremities: No clubbing, cyanosis, or edema. Distal pedal pulses are 2+ bilaterally. Neuro:  Alert and oriented X 3. Moves all extremities spontaneously. Slurred speech noted. Psych: Normal affect.   LABS: CBC: Recent Labs  03/19/15 0940 03/20/15 0440  WBC 4.2 4.0  HGB 13.6 11.7*  HCT 41.2 37.0  MCV 84.9 86.4  PLT 190 174   INR: Recent Labs  03/19/15 0939  INR 1.00   Basic Metabolic Panel: Recent Labs  03/19/15 0939 03/20/15 0440  NA 140 137  K 3.8 4.0  CL 104 104  CO2 25 24  GLUCOSE 89 130*  BUN 10 12  CREATININE 1.31* 1.21*  CALCIUM 9.2 8.6*   Liver Function Tests: Recent Labs  03/19/15 0939  AST 23  ALT 19  ALKPHOS 80  BILITOT 0.6  PROT 6.5  ALBUMIN 3.3*   Cardiac Enzymes: Recent Labs  03/19/15 1720 03/19/15 2346 03/20/15 0440  TROPONINI 0.06* 0.06* 0.07*    Recent Labs  03/19/15 0938  TROPIPOC 0.05   D-dimer: Recent Labs  03/19/15 2334  DDIMER 1.04*    TELE: NSR with rate in mid-50's - 90's. No atopic events.        TEE: 03/01/2015 Study Conclusions - Left ventricle: Systolic function was normal. The estimated ejection fraction was in the range of 55% to 60%. Wall motion was normal; there were no regional wall motion abnormalities. - Left atrium: No evidence of thrombus in the atrial cavity or appendage. - Right atrium:  No evidence of thrombus in the atrial cavity or appendage. - Atrial septum: Agitated saline contrast study showed a large right-to-left atrial level shunt, in the baseline state. There is a very small secundum atrial septal defect with tiny left to right shunt. In addition, due to septal hypermobility, a patent foramen ovale allows brief, but large volume right to left shunting with each breath. - Pulmonary arteries: PA peak pressure: 35 mm Hg (S).   Radiology/Studies: Dg Chest 2 View: 03/19/2015  CLINICAL DATA:  34 year old female with left chest pain since yesterday afternoon radiating to the arm and back. Initial encounter. EXAM: CHEST  2 VIEW COMPARISON:  02/27/2015, 01/03/2015.  FINDINGS: Seated AP and lateral views of the chest. Mild cardiomegaly is stable. Mildly lower lung volumes. Other mediastinal contours are within normal limits. No pneumothorax, pulmonary edema or pleural effusion. No consolidation. Conjoined left fourth and fifth ribs. No acute pulmonary opacity. No acute osseous abnormality identified. IMPRESSION: Stable mild cardiomegaly.  No acute cardiopulmonary abnormality. Electronically Signed   By: Odessa FlemingH  Hall M.D.   On: 03/19/2015 09:15   Mr Brain Wo Contrast (neuro Protocol): 03/19/2015  CLINICAL DATA:  LEFT-sided chest pain with slurred speech. History of stroke. Symptom onset earlier today. EXAM: MRI HEAD WITHOUT CONTRAST TECHNIQUE: Multiplanar, multiecho pulse sequences of the brain and surrounding structures were obtained without intravenous contrast. COMPARISON:  MR brain 02/27/2015.  No recent CT head. FINDINGS: Previously identified 2.5 cm RIGHT frontal cortex infarction has now normalized, with only slight brain substance loss and residual gliosis. Tiny LEFT frontal cortical infarct noted previously, only a few mm in diameter, has also normalized. On diffusion imaging today, there is an acute subcentimeter LEFT frontal cortical infarct quite near where the previous insult was located. See image 32 series 4. This involves LEFT MCA territory and is nonhemorrhagic. No other areas of definite restricted diffusion are observed. No acute hemorrhage, mass lesion, hydrocephalus, or extra-axial fluid. Normal for age cerebral volume. No significant white matter disease. No foci of chronic hemorrhage. No midline abnormality. Extracranial soft tissues unremarkable. IMPRESSION: Acute subcentimeter LEFT frontal cortical infarct without hemorrhage. This is adjacent to, but slightly different location from, the previous infarction noted in October. Resolving RIGHT frontal infarction which was acute in October as well. Electronically Signed   By: Elsie StainJohn T Curnes M.D.   On: 03/19/2015  10:38     Current Medications:  . aspirin EC  81 mg Oral Daily  . atorvastatin  40 mg Oral q1800  . clopidogrel  75 mg Oral Daily  . gabapentin  900 mg Oral TID  . heparin  5,000 Units Subcutaneous 3 times per day  . hydrochlorothiazide  12.5 mg Oral Daily  . QUEtiapine  300 mg Oral QHS  . risperiDONE  0.25 mg Oral BID  . senna  1 tablet Oral BID  . sodium chloride  3 mL Intravenous Q12H  . trazodone  300 mg Oral QHS   . sodium chloride 10 mL/hr at 03/19/15 2005    ASSESSMENT AND PLAN: 1. Atypical Chest Pain - has been constant since 0700 this morning. Relieved with administration of pain medication while in the ED. - initial troponin negative. Cyclic cardiac enzymes have been 0.06, 0.06, and 0.07. - EKG without any acute ST or T wave changes. - will obtain nuclear stress test this AM. The procedure was explained to the patient and all questions answered. - continue ASA, statin, and Plavix.   2. Acute subcentimeter left frontal cortical infarct without hemorrhage  -  noted on MRI on 03/19/2015 - recent CVA's in 2015, 12/2014, and 02/2015. - management per neurology and primary team - has known PFO and will be followed up with ASD/PFO closure team as outpatient.   Lorri Frederick , PA-C 8:02 AM 03/20/2015 Pager: [phone]  Agree with note by Reita May  Atyp non cardiac CP. Trop neg. For MV this AM. If neg will S/O and F/U as OP with PFO closure team  Runell Gess, M.D., FACP, [facility], [facility] [address] [place], [place]  [address]  [phone] 03/20/2015 10:15 AM

## 2015-03-20 NOTE — Progress Notes (Signed)
OT Cancellation Note  Patient Details Name: Tanya Mayer MRN: [id number] DOB: 06/16/1980   Cancelled Treatment:    Reason Eval/Treat Not Completed: Patient at procedure or test/ unavailable. Pt at stress test. Will follow up tomorrow.  [facility]  Tanya Mayer, OTR/L  [phone] 03/20/2015 03/20/2015, 1:40 PM

## 2015-03-20 NOTE — Progress Notes (Addendum)
Patient presented for Lexiscan. Tolerated procedure well. The patient is very lethargic and drowsy with slurred speech. Unable to get BP on Left arm initially. Case discussed with Dr. Allyson SabalBerry. Will get upper extremity doppler.   L arm: BP 104/65 with very week radial pulse  R arm: BP 111/62 with 2+ radial pulse.   Result to follow.   Tanya Mayer, PAC

## 2015-03-20 NOTE — Progress Notes (Signed)
SLP Cancellation Note  Patient Details Name: Tanya Mayer MRN: [id number] DOB: 10/27/1980   Cancelled treatment:       Reason Eval/Treat Not Completed: SLP screened, no needs identified, will sign off. SLP ordered for swallow eval, but pt passed RN stroke swallow screen and MD's note reports this and orders heart healthy diet after stress test. Will discontinue current SLP orders.  Discussed with RN.  Please reorder if concern for aspiration arises.    Nasia Cannan, Riley NearingBonnie Caroline 03/20/2015, 8:15 AM

## 2015-03-20 NOTE — Progress Notes (Signed)
STROKE TEAM PROGRESS NOTE   HISTORY Tanya Mayer is an 34 y.o. female who lives in a homeless shelter. She was last seen in the hospital this September and then again in October for left sided weakness. In september found to have 2 punctate strokes in right hemisphere. She was admitted but left AMA. She returned in october stating she has left sided weakness. On exam she was a poor historian, will speak clearly to start but then mumbles her words but again will speak clearly at the end of her sentence. She perseverated on the fact she has had multiple strokes. MRI of her brain showed bilateral frontal cortical acute ischemic infarctions at that time. At that time no AC was recommended due to no source of emboli found. In addition closure of PFO was not recommended as there is no clear association between its presence and stroke without known source. Today she present with another acute sub centimeter left frontal cortical infarct found as a incidental finding as patients main complaint was chest and left arm pain. Unable to determine LKW, Patient was not administered TPA secondary to mild symptoms. She was admitted for further evaluation and treatment.   SUBJECTIVE (INTERVAL HISTORY) Her husband is at the bedside.  She came in for chest pain and her neuro condition is at her baseline with dysarthria and left sided weakness. She is drowsy and falls asleep during the event. Waiting for the stress test today.   OBJECTIVE Temp:  [97.9 F (36.6 C)-98.5 F (36.9 C)] 98.5 F (36.9 C) (11/09 0435) Pulse Rate:  [56-64] 64 (11/08 1640) Cardiac Rhythm:  [-] Normal sinus rhythm (11/08 2000) Resp:  [12-14] 12 (11/08 1835) BP: (92-141)/(48-97) 104/68 mmHg (11/09 0435) SpO2:  [97 %-100 %] 99 % (11/08 1640) Weight:  [75.025 kg (165 lb 6.4 oz)-76.885 kg (169 lb 8 oz)] 75.025 kg (165 lb 6.4 oz) (11/09 0435)  CBC:  Recent Labs Lab 03/19/15 0940 03/20/15 0440  WBC 4.2 4.0  HGB 13.6 11.7*  HCT 41.2  37.0  MCV 84.9 86.4  PLT 190 174    Basic Metabolic Panel:  Recent Labs Lab 03/19/15 0939 03/20/15 0440  NA 140 137  K 3.8 4.0  CL 104 104  CO2 25 24  GLUCOSE 89 130*  BUN 10 12  CREATININE 1.31* 1.21*  CALCIUM 9.2 8.6*    Lipid Panel:    Component Value Date/Time   CHOL 177 02/28/2015 0115   TRIG 156* 02/28/2015 0115   HDL 40* 02/28/2015 0115   CHOLHDL 4.4 02/28/2015 0115   VLDL 31 02/28/2015 0115   LDLCALC 106* 02/28/2015 0115   HgbA1c:  Lab Results  Component Value Date   HGBA1C 5.7* 02/28/2015   Urine Drug Screen:    Component Value Date/Time   LABOPIA POSITIVE* 03/20/2015 0440   COCAINSCRNUR NONE DETECTED 03/20/2015 0440   LABBENZ NONE DETECTED 03/20/2015 0440   AMPHETMU NONE DETECTED 03/20/2015 0440   THCU NONE DETECTED 03/20/2015 0440   LABBARB NONE DETECTED 03/20/2015 0440      IMAGING I have personally reviewed the radiological images below and agree with the radiology interpretations.  Mr Brain Wo Contrast  03/19/2015  Acute subcentimeter LEFT frontal cortical infarct without hemorrhage. This is adjacent to, but slightly different location from, the previous infarction noted in October. Resolving RIGHT frontal infarction which was acute in October as well.   MRI and MRA 01/03/15 - Two probable punctate left frontal white matter infarcts, uncertainty related to small size. Negative intracranial MRA.  MRI and MRA 02/27/15 Acute infarct in the right frontal cortex 2.5 cm. Small acute infarct left frontal cortex. Findings are suggestive of emboli. Recommend evaluation for cardiac source of emboli.  Negative intracranial MRA.  03/01/15 TEE - - Left ventricle: Systolic function was normal. The estimated ejection fraction was in the range of 55% to 60%. Wall motion was normal; there were no regional wall motion abnormalities. - Left atrium: No evidence of thrombus in the atrial cavity or appendage. - Right atrium: No evidence of thrombus in the  atrial cavity or appendage. - Atrial septum: Agitated saline contrast study showed a large right-to-left atrial level shunt, in the baseline state. There is a very small secundum atrial septal defect with tiny left to right shunt. In addition, due to septal hypermobility, a patent foramen ovale allows brief, but large volume right to left shunting with each breath. - Pulmonary arteries: PA peak pressure: 35 mm Hg (S).  CUS - Bilateral: 1-39% ICA stenosis. Vertebral artery flow is antegrade.  LE venous doppler - - No evidence of deep vein thrombosis involving the right lower  extremity and left lower extremity.  UE venous doppler - pending  Stress test - There was no ST segment deviation noted during stress.  The study is normal.  This is a low risk study.  The left ventricular ejection fraction is normal (55-65%). Nuclear stress EF: 64%.  Component     Latest Ref Rng 02/27/2015 02/28/2015  Cholesterol     0 - 200 mg/dL  409177  Triglycerides     <150 mg/dL  811156 (H)  HDL Cholesterol     >40 mg/dL  40 (L)  Total CHOL/HDL Ratio       4.4  VLDL     0 - 40 mg/dL  31  LDL (calc)     0 - 99 mg/dL  914106 (H)  PTT Lupus Anticoagulant     0.0 - 50.0 sec 34.2   DRVVT     0.0 - 55.1 sec 41.7   Lupus Anticoag Interp      Comment:   Beta-2 Glyco I IgG     0 - 20 GPI IgG units <9   Beta-2-Glycoprotein I IgM     0 - 32 GPI IgM units 11   Beta-2-Glycoprotein I IgA     0 - 25 GPI IgA units <9   Anticardiolipin IgG     0 - 14 GPL U/mL <9   Anticardiolipin IgM     0 - 12 MPL U/mL <9   Anticardiolipin IgA     0 - 11 APL U/mL <9   Hemoglobin A1C     4.8 - 5.6 %  5.7 (H)  Mean Plasma Glucose       117  Recommendations-F5LEID:      Comment   Comment      Comment   Recommendations-PTGENE:      Comment   Additional Information      Comment   Antithrombin Activity     75 - 120 % 100   Protein C Activity     74 - 151 % 142   Protein C, Total     70 - 140 % 97    Protein S Activity     60 - 145 % 81   Protein S Ag, Total     58 - 150 % 123   Homocysteine     0.0 - 15.0 umol/L 19.6 (H)    Sickle cell  screen - pending  PHYSICAL EXAM  Temp:  [97.9 F (36.6 C)-98.5 F (36.9 C)] 98.2 F (36.8 C) (11/09 1141) Pulse Rate:  [57-116] 105 (11/09 1410) Resp:  [12-13] 12 (11/08 1835) BP: (92-200)/(48-89) 104/65 mmHg (11/09 1410) SpO2:  [99 %] 99 % (11/08 1640) Weight:  [165 lb 6.4 oz (75.025 kg)-169 lb 8 oz (76.885 kg)] 165 lb 6.4 oz (75.025 kg) (11/09 0435)  General - Well nourished, well developed, in no apparent distress.  Ophthalmologic - Fundi not visualized due to noncooperative.  Cardiovascular - Regular rate and rhythm .  Mental Status -  Level of arousal and orientation to time, place, and person were intact. Language including expression, naming, repetition, comprehension was assessed and found intact. Fund of Knowledge was assessed and was intact.  Cranial Nerves II - XII - II - Visual field intact OU. III, IV, VI - Extraocular movements intact. V - Facial sensation intact bilaterally. VII - Facial movement decreased bilaterally, but lack of effort on exam. VIII - Hearing & vestibular intact bilaterally. X - Palate elevates symmetrically, moderate dysarthria. XI - Chin turning & shoulder shrug intact bilaterally. XII - Tongue protrusion intact.  Motor Strength - The patient's strength was LUE 2-/5 and LLE 2/5 but lack of effort on exam with hoover's sign on the left.  Bulk was normal and fasciculations were absent.   Motor Tone - Muscle tone was assessed at the neck and appendages and was normal.  Reflexes - The patient's reflexes were 1+ in all extremities and she had no pathological reflexes.  Sensory - Light touch, temperature/pinprick were assessed and were subjective decreased on the right with 70-90% decrease.    Coordination - The patient had normal movements in the right hand with no ataxia or dysmetria.  Tremor was  absent.  Gait and Station - not able to test.   ASSESSMENT/PLAN Ms. Nihal Doan is a 34 y.o. female with history of hypertension, DM-2, Bipolar disorder, Schizophrenia, depression, CVA 12/2014 and 02/2015 presenting with chest pain. Cardiology on board and undergoing stress test.   Stroke:  Incidental L frontal cortical infarct, appears embolic secondary to unknown source. DDx include ? clotting d/o in setting of PFO and primary CNS vasculitis due to history of HA and multifocal stroke. Pt does have large PFO, but clotting disorder is in questions since no record showing DVT, no IVC filter on abd X-ray, and hypercoagulable test all negative except homocysteine level slightly high. However, due to recurrent embolic infarcts in young female with large PFO, recommend PFO closure. For PCNS vasculitis, pt MRA negative, LP not able to be done during hosptiatlization due to on plavix. Will need to arrange for outpt follow up and potential LP with holding off plavix for 5 days prior to procedure.  MRI   L frontal cortical punctate DWI changes, may be infarct  MRA negative x 2  CUS unremarkable in 02/2015  LE venous doppler negative in 02/2015  UE venous dopplers pending   TEE showed large PFO last admssion  LDL 106 in Oct  HgbA1c 5.7 in Oct  Pregnancy test negatove  UDS negative for cocaine or THC  Last admission homo elevated at 19.6 probably related to smoking, other hypercoagulable work up negative.  Heparin 5000 units sq tid for VTE prophylaxis  Diet NPO time specified  clopidogrel 75 mg daily prior to admission, now on aspirin 81 mg daily and clopidogrel 75 mg daily. Continue dual antiplatelet for stroke and cardiac prevention.  Patient counseled to  be compliant with her antithrombotic medications  Ongoing aggressive stroke risk factor management  Therapy recommendations:  pending   Disposition:  pending   Recurrent embolic stroke  August 2016 - 2 punctate left frontal  white matter DWI changes  October 2016 - right MCA infarct with left frontal punctate DWI changes  November 2016 - asymptomatic left frontal punctate DWI changes  Known PFO  Found large PFO per TEE udring Oct admission  Due to recurrent embolic stroke in young female with large PFO, recommend PFO closure.  Will refer to Dr. Excell Seltzer as outpatient for evaluation   Questionable causing disorder  Patient reported history of DVT in April 2016 with IVC filter  However, there is no medical record showing the DVT episode and abdominal x-ray did not show IVC filter  Hypercoagulable workup negative except homocystine level 19.6  Recommend smokes sensation, supplement with B-12, B-6 and folic acid  Chest pain  Cardiology on board  Stress test negative  Atypical chest pain  Flat troponin level  Hypertension  StableBP goal normotensive   Hyperlipidemia  Home meds:  lipitor 40 mg,  resumed in hospital  LDL 106 in Oct, goal < 70  Continue statin at discharge  Pre Diabetes  HgbA1c 5.7 in Oct, at goal < 7.0  Diet Controlled  Tobacco abuse  Current smoker  Smoking cessation counseling provided  Nicotine patch provided  Pt is willing to quit  Other Stroke Risk Factors  Marijuana use, positive last adm, negative this one. UDS positive for opiates  Obesity, Body mass index is 32.3 kg/(m^2).   Family hx stroke (mother)  Other Active Problems  Per Aug note patient is followed at the [facility], no notes seen in EPIC, screen ordered.  Hospital day # 1  Neurology will sign off. Please call with questions. Pt will follow up with Dr. Roda Shutters at [facility] in about 1-2 months. Thanks for the consult.  Marvel Plan, MD PhD Stroke Neurology 03/20/2015 4:50 PM    To contact Stroke Continuity provider, please refer to [email or web address]. After hours, contact General Neurology

## 2015-03-20 NOTE — Progress Notes (Signed)
PT Cancellation Note  Patient Details Name: Tanya Mayer MRN: [id number] DOB: 03/19/1981   Cancelled Treatment:    Reason Eval/Treat Not Completed: Patient at procedure or test/unavailable Pt off floor at stress test. Will follow up next available time to perform PT evaluation.   Blake DivineShauna A Jaeda Bruso 03/20/2015, 1:28 PM Mylo RedShauna Kerstyn Coryell, PT, DPT [phone]

## 2015-03-20 NOTE — Telephone Encounter (Signed)
I am using this telephone note interface to send a message [facility] per Dr. Hazle CocaBerry's notes, this patient needs an outpatient consult with PFO closure team to discuss possible PFO closure. Will forward to schedulers and Dr. Earmon Phoenixooper's nurse.  Dayna Dunn PA-C

## 2015-03-20 NOTE — Progress Notes (Signed)
Family Medicine Teaching Service Daily Progress Note Intern Pager: [phone]  Patient name: Tanya Mayer Medical record number: [id number] Date of birth: June 07, 1980 Age: 34 y.o. Gender: female  Primary Care Provider: No PCP Per Patient Consultants: Neurology, Cardiology Code Status: Full  Pt Overview and Major Events to Date:  11/8: Admitted to the FPTS  Assessment and Plan: Varetta Chavers is a 34 y.o. female presenting with new acute L frontal cortical infarct without hemorrhage and atypical chest pain. 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  Acute L frontal CVA: Pt endorsing worsening of her slurred speech and L hemiparesis from baseline. MRI brain showing acute left frontal cortical infarct without hemorrhage that is adjacent to but at a slightly different location from the previous infarction in October. Some concern at previous hospitalization for hypercoagulable state due to MRI findings consistent with emboli and history of DVT, although labs were negative. TEE at previous admission showed PFO and ASD. - Neurology did not recommend any additional neurological work-up. - Will get ECHO today to rule out any vegetations or other possible causes of emboli - Continue Plavix. We have started ASA . - Last HgbA1c (02/2015): 5.7% - Last lipid panel (02/2015): Chol 177, TG 156, HDL 40, LDL 106. Continue Lipitor  qd. - Neuro checks every 2 hours, then every 4 hours - PT/OT/SLP  Atypical chest pain: Sharp, reproducible, left-sided pain. ACS is less likely, given the reproducibility of her pain, negative I-stat troponin, and EKG showing NSR. Some concern for left upper extremity DVT, as Pt is very tender to palpation of her left upper extremity and left chest and she has a history of hypercoagulability. Other differentials include pericarditis,  costochondritis, and stress-induced chest pain. Pulmonary causes such as pneumothorax and pneumonia are less likely, given her negative CXR. - Troponins elevated at 0.06 > 0.06 > 0.07. Of note, they were elevated at her previous admission up to 0.12. - Cardiology consulted, appreciate recommendations. Will take her for a nuclear stress test in the am. NPO at midnight. - Cards will have structural heart team evaluate her for PFO/ASD closure as an outpatient. - ECHO as above - Will get left upper extremity dopplers to rule out DVT - Morphine 2-4mg  q4hrs PRN and Tylenol for pain - Cardiac monitoring  HTN: BPs ranging from 92-141/48-97 since admission. - Home HCTZ is being held in the setting of new CVA and because of her low blood pressures.  HLD: Last lipid panel (02/2015): Chol 177, TG 156, HDL 40, LDL 106. - Continue home med: Lipitor  qd  Schizophrenia/Bipolar disorder: Somewhat flattened affect on exam. - Continue home meds: Risperidone 0.25mg  bid, Seroquel  qd - Trazodone  for sleep  Tobacco abuse: Pt smoking 0.5-1 ppd.  - Nicotine patch  FEN/GI: NPO for stress test today, resume diet afterwards. SLIV. Prophylaxis: sq heparin  Disposition: Home pending results of stress test today.  Subjective:  Pt states she is tired this morning and didn't sleep well. She feels like her chest pain is unchanged from yesterday. She also feels like her slurred speech and left-sided weakness are unchanged.   Objective: Temp:  [97.6 F (36.4 C)-98.5 F (36.9 C)] 98.5 F (36.9 C) (11/09 0435) Pulse Rate:  [56-74] 64 (11/08 1640) Resp:  [12-20] 12 (11/08 1835) BP: (92-141)/(48-97) 104/68 mmHg (11/09 0435) SpO2:  [97 %-100 %] 99 % (11/08 1640) Weight:  [165 lb  6.4 oz (75.025 kg)-170 lb (77.111 kg)] 165 lb 6.4 oz (75.025 kg) (11/09 0435) Physical Exam: General: Laying in bed, tired-appearing, in NAD, speech is slurred. HEENT: [place]/AT, EOMI, no scleral icterus, dry mucous  membranes Neck: Supple Cardiovascular: RRR, no murmurs, rubs, or gallops, did not appreciate subclavian bruit. Respiratory: CTAB, normal work of breathing, no wheezes Abdomen: +BS, soft, non-tender, non-distended MSK: No edema in the lower extremities bilaterally Skin: no rashes Neuro: Awake, alert, oriented; does not move left side, able to spontaneously move right side. Psych: Affect is somewhat flattened, appropriate behavior.  Laboratory:  Recent Labs Lab 03/19/15 0940 03/20/15 0440  WBC 4.2 4.0  HGB 13.6 11.7*  HCT 41.2 37.0  PLT 190 174    Recent Labs Lab 03/19/15 0939 03/20/15 0440  NA 140 137  K 3.8 4.0  CL 104 104  CO2 25 24  BUN 10 12  CREATININE 1.31* 1.21*  CALCIUM 9.2 8.6*  PROT 6.5  --   BILITOT 0.6  --   ALKPHOS 80  --   ALT 19  --   AST 23  --   GLUCOSE 89 130*   i-stat trop 0.05 PT 13.4, INR 1.00  Imaging/Diagnostic Tests: CXR (11/8): Stable mild cardiomegaly, no acute cardiopulmonary abnormality. MRI brain (11/8): Acute subcentimeter left frontal cortical infarct without hemorrhage. This is adjacent to, but slightly different location from, the previous infarct in Oct. Resolving R frontal infarct which was acute in Oct as well. EKG (11/8): NSR  Campbell StallKaty Dodd Shaconda Hajduk, MD 03/20/2015, 6:59 AM PGY-1, [facility] FPTS Intern pager: [phone], text pages welcome

## 2015-03-20 NOTE — Progress Notes (Signed)
UR Completed Anastasia Tompson Graves-Bigelow, RN,BSN [phone]  

## 2015-03-21 ENCOUNTER — Ambulatory Visit (HOSPITAL_COMMUNITY): Payer: Medicaid Other

## 2015-03-21 ENCOUNTER — Other Ambulatory Visit (HOSPITAL_COMMUNITY): Payer: Medicaid Other

## 2015-03-21 ENCOUNTER — Inpatient Hospital Stay (HOSPITAL_COMMUNITY): Payer: Medicaid Other

## 2015-03-21 DIAGNOSIS — I10 Essential (primary) hypertension: Secondary | ICD-10-CM

## 2015-03-21 DIAGNOSIS — R52 Pain, unspecified: Secondary | ICD-10-CM

## 2015-03-21 DIAGNOSIS — R0989 Other specified symptoms and signs involving the circulatory and respiratory systems: Secondary | ICD-10-CM

## 2015-03-21 DIAGNOSIS — R079 Chest pain, unspecified: Secondary | ICD-10-CM | POA: Insufficient documentation

## 2015-03-21 LAB — BASIC METABOLIC PANEL
Anion gap: 7 (ref 5–15)
BUN: 12 mg/dL (ref 6–20)
CO2: 25 mmol/L (ref 22–32)
CREATININE: 1.28 mg/dL — AB (ref 0.44–1.00)
Calcium: 9 mg/dL (ref 8.9–10.3)
Chloride: 105 mmol/L (ref 101–111)
GFR calc Af Amer: >60 mL/min (ref >60)
GFR, EST NON AFRICAN AMERICAN: 53 mL/min — AB (ref >60)
Glucose, Bld: 127 mg/dL — ABNORMAL HIGH (ref 65–99)
POTASSIUM: 4 mmol/L (ref 3.5–5.1)
SODIUM: 137 mmol/L (ref 135–145)

## 2015-03-21 LAB — SICKLE CELL SCREEN: Sickle Cell Screen: NEGATIVE

## 2015-03-21 NOTE — Progress Notes (Signed)
OT Cancellation Note  Patient Details Name: Jackquline BerlinLatonya Guthmiller MRN: [id number] DOB: 01/21/1981   Cancelled Treatment:    Reason Eval/Treat Not Completed: Patient at procedure or test/ unavailable - will reattempt.  Angelene GiovanniConarpe, Idalys Konecny M  Dealie Koelzer Cudahyonarpe, OTR/L [phone]' 03/21/2015, 1:09 PM

## 2015-03-21 NOTE — Discharge Summary (Signed)
Family Medicine Teaching [facility] Discharge Summary  Patient name: Tanya Mayer Medical record number: [id number] Date of birth: 1980/06/13 Age: 34 y.o. Gender: female Date of Admission: 03/19/2015  Date of Discharge: 03/22/15 Admitting Physician: Doreene Eland, MD  Primary Care Provider: No PCP Per Patient Consultants: Neurology, Cardiology  Indication for Hospitalization: Stroke  Discharge Diagnoses/Problem List:  CVA Chest Pain HTN HLD  Disposition: Back to [facility] (homeless shelter).  Discharge Condition: Stable  Discharge Exam:  General: Laying in bed, tired-appearing, eating breakfast, in NAD. HEENT: [place]/AT, EOMI, no scleral icterus, dry mucous membranes Neck: Supple Cardiovascular: RRR, no murmurs, rubs, or gallops. Radial pulses are equal bilaterally. Respiratory: CTAB, normal work of breathing, no wheezes Abdomen: +BS, soft, non-tender, non-distended MSK: No edema in the lower extremities bilaterally. Does not awaken to palpation of left upper extremity. Skin: no rashes Neuro: Sleeping throughout exam; does not move left side. Psych: Affect is somewhat flattened, appropriate behavior.  Brief Hospital Course:  Tanya Mayer is a 34 year old female who presented to the ED with worsening slurred speech and L hemiparesis from baseline. MRI brain showed acute left frontal cortical infarct without hemorrhage. She has a history of strokes with MRI findings consistent with emobli, as well as a history of DVT, although her hypercoagulability labs were normal at last admission. Previous TEE showed PFO and ASD. Hospital course is described by problem list below:  1. Acute L frontal cortical CVA: Neurology saw her and did not recommend any additional neurological work-up. We continued her home Plavix and started ASA . We continued her Lipitor  qd. Last HgbA1c was in 02/2015 and was 5.7%. Last lipid panel was in 02/2015 and LDL was 106. PT recommended no PT  follow-up and rolling walker with 5" wheels. OT recommended no OT follow-up. SLP recommended HHST. On the day of discharge, she was able to stand and walk with assistance.   2. Chest pain, atypical: Pt endorsed sharp, left-sided chest pain that was reproducible with palpation. Troponins were mildly elevated at 0.06 > 0.06 > 0.07. EKG showed normal sinus rhythm. Given her history of hypercoagulability, we were concerned for upper extremity DVT. We ordered an upper extremity venous dopplers, which were negative. Cardiology was consulted and recommended nuclear stress test, which was normal. While she was down at her stress test, she was noted to have a weak radial pulse on the left. Upper extremity arterial dopplers showed no vascular insufficiencies. Cardiology also set up a follow-up appointment for her with the PFO closure team on 11/18.   3. Fevers: Pt spiked a few fevers on 11/10 to 100.5 and 101.6. Etiology was unknown. The fevers responded well to Tylenol. She denied and urinary symptoms or SOB.  4. HTN: BPs were fairly well-controlled throughout hospitalization. We held her home HCTZ for permissive hypertension in the setting of new CVA. We resumed the HCTZ on discharge.  Issues for Follow Up:  1. Please set up [facility] speech therapy once Pt moves into an apartment. HHST will not come to [facility]. 2. Pt spiked fevers to 101.6. Etiology was unclear. Please follow-up to make sure Pt is not having any dysuria, SOB, etc.  3. Pt is taking both Risperidone and Seroquel for Schizophrenia and Bipolar disorder. Consider stopping one of these.  Significant Procedures: Nuclear stress test- normal.  Significant Labs and Imaging:   Recent Labs Lab 03/19/15 0940 03/20/15 0440  WBC 4.2 4.0  HGB 13.6 11.7*  HCT 41.2 37.0  PLT 190 174  Recent Labs Lab 03/19/15 0939 03/20/15 0440 03/21/15 0350  NA 140 137 137  K 3.8 4.0 4.0  CL 104 104 105  CO2 25 24 25   GLUCOSE 89 130* 127*  BUN 10 12 12    CREATININE 1.31* 1.21* 1.28*  CALCIUM 9.2 8.6* 9.0  ALKPHOS 80  --   --   AST 23  --   --   ALT 19  --   --   ALBUMIN 3.3*  --   --    Troponins: 0.06 > 0.06 > 0.07 PT 13.4, INR 1.00  CXR (11/8): Stable mild cardiomegaly, no acute cardiopulmonary abnormality. MRI brain (11/8): Acute subcentimeter left frontal cortical infarct without hemorrhage. This is adjacent to, but slightly different location from, the previous infarct in Oct. Resolving R frontal infarct which was acute in Oct as well. EKG (11/8): NSR Nuclear stress test (11/9): normal  Results/Tests Pending at Time of Discharge: None  Discharge Medications:    Medication List    ASK your doctor about these medications        acetaminophen 500 MG tablet  Commonly known as:  TYLENOL  Take 500 mg by mouth every 6 (six) hours as needed for moderate pain.     albuterol 108 (90 BASE) MCG/ACT inhaler  Commonly known as:  PROVENTIL HFA;VENTOLIN HFA  Inhale 2 puffs into the lungs every 6 (six) hours as needed for wheezing or shortness of breath.     atorvastatin 40 MG tablet  Commonly known as:  LIPITOR  Take 1 tablet (40 mg total) by mouth daily at 6 PM.     clopidogrel 75 MG tablet  Commonly known as:  PLAVIX  Take 1 tablet (75 mg total) by mouth daily.     gabapentin 300 MG capsule  Commonly known as:  NEURONTIN  Take 3 capsules (900 mg total) by mouth 3 (three) times daily.     hydrochlorothiazide 12.5 MG capsule  Commonly known as:  MICROZIDE  Take 12.5 mg by mouth daily.     hydrOXYzine 50 MG capsule  Commonly known as:  VISTARIL  Take 50 mg by mouth at bedtime.     oxycodone 30 MG immediate release tablet  Commonly known as:  ROXICODONE  Take 30 mg by mouth every 8 (eight) hours.     QUEtiapine 300 MG tablet  Commonly known as:  SEROQUEL  Take 1 tablet (300 mg total) by mouth at bedtime.     risperiDONE 0.25 MG tablet  Commonly known as:  RISPERDAL  Take 1 tablet (0.25 mg total) by mouth 2 (two)  times daily.     trazodone 300 MG tablet  Commonly known as:  DESYREL  Take 1 tablet (300 mg total) by mouth at bedtime.        Discharge Instructions: Please refer to Patient Instructions section of EMR for full details.  Patient was counseled important signs and symptoms that should prompt return to medical care, changes in medications, dietary instructions, activity restrictions, and follow up appointments.   Follow-Up Appointments:     Follow-up Information    Follow up with Xu,Jindong, MD. Schedule an appointment as soon as possible for a visit in 2 months.   Specialty:  Neurology   Why:  stroke clinic   Contact information:   [address] [place] [id number] [phone]       Follow up with Tonny Bollmanooper, Michael, MD.   Specialty:  Cardiology   Why:   [facility] - 03/29/15 at  11:30am (to discuss PFO closure)   Contact information:   [address] [place] [address] [phone]       Campbell Stall, MD 03/21/2015, 3:34 PM PGY-1, [facility] Family Medicine

## 2015-03-21 NOTE — Progress Notes (Signed)
Family Medicine Teaching Service Daily Progress Note Intern Pager: [phone]  Patient name: Tanya Mayer Medical record number: [id number] Date of birth: [birth year] Age: 34 y.o. Gender: female  Primary Care Provider: No PCP Per Patient Consultants: Neurology, Cardiology Code Status: Full  Pt Overview and Major Events to Date:  11/8: Admitted to the FPTS  Assessment and Plan: Akita Maxim is a 34 y.o. female presenting with new acute L frontal cortical infarct without hemorrhage and atypical chest pain. 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  Acute L frontal CVA: Pt endorsing worsening of her slurred speech and L hemiparesis from baseline. MRI brain showing acute left frontal cortical infarct without hemorrhage that is adjacent to but at a slightly different location from the previous infarction in October. Some concern at previous hospitalization for hypercoagulable state due to MRI findings consistent with emboli and history of DVT, although labs were negative. TEE at previous admission showed PFO and ASD. - Neurology consulted, appreciate recommendations. Pt will follow-up with Neuro as an outpatient to have LP done to rule out CNS vasculitis. This cannot be done while she is in the hospital because she is on Plavix. - Continue dual antiplatelet therapy with ASA and Plavix. - Last HgbA1c (02/2015): 5.7% - Last lipid panel (02/2015): Chol 177, TG 156, HDL 40, LDL 106. Continue Lipitor  qd. - PT/OT consult, appreciate recommendations. - Speech has signed off, no needs identified  Atypical chest pain: Sharp, reproducible, left-sided pain. ACS is less likely, given the reproducibility of her pain, negative I-stat troponin, and EKG showing NSR. Some concern for left upper extremity DVT, as Pt is very tender to palpation of her left upper extremity and  left chest and she has a history of hypercoagulability. - Troponins elevated at 0.06 > 0.06 > 0.07. Of note, they were elevated at her previous admission up to 0.12. - Cardiology consulted, appreciate recommendations. Nuclear stress test was normal, however, she was noted to have a very weak radial pulse on the left, so they ordered an upper extremity arterial doppler in addition to the upper extremity venous doppler that was done. Will follow-up with both of these. - Cards will schedule her an appointment with the PFO consult team as an outpatient. - Morphine 2-4mg  q4hrs PRN and Tylenol for pain - Cardiac monitoring  HTN: BPs ranging from 104-108/55-65 since admission. - Home HCTZ is being held in the setting of new CVA and because of her low blood pressures.  HLD: Last lipid panel (02/2015): Chol 177, TG 156, HDL 40, LDL 106. - Continue home med: Lipitor  qd  Schizophrenia/Bipolar disorder: Somewhat flattened affect on exam. - Continue home meds: Risperidone 0.25mg  bid, Seroquel  qd - Trazodone  for sleep  Tobacco abuse: Pt smoking 0.5-1 ppd.  - Nicotine patch  FEN/GI: Heart healthy diet. SLIV. Prophylaxis: sq heparin  Disposition: Awaiting discharge recommendations from PT/OT. Discharge pending results of upper extremity dopplers.   Subjective:  Pt states she is very tired this morning. Still having chest pain. Not really answering questions this morning.  Objective: Temp:  [98.2 F (36.8 C)-99.6 F (37.6 C)] 99.6 F (37.6 C) (11/10 0430) Pulse Rate:  [57-116] 78 (11/10 0430) Resp:  [18-20] 19 (11/10 0430) BP: (103-200)/(55-67) 108/65 mmHg (11/10 0430) SpO2:  [96 %-97 %] 96 % (11/10 0430) Weight:  [166 lb 9.6 oz (75.569 kg)] 166 lb 9.6 oz (75.569  kg) (11/10 0430) Physical Exam: General: Laying in bed, tired-appearing, falling asleep throughout exam, in NAD. HEENT: [place]/AT, EOMI, no scleral icterus, dry mucous membranes Neck: Supple Cardiovascular: RRR, no  murmurs, rubs, or gallops. Radial pulses are equal bilaterally. Respiratory: CTAB, normal work of breathing, no wheezes Abdomen: +BS, soft, non-tender, non-distended MSK: No edema in the lower extremities bilaterally. Does not awaken to palpation of left upper extremity. Skin: no rashes Neuro: Sleeping throughout exam; does not move left side. Psych: Affect is somewhat flattened, appropriate behavior.  Laboratory:  Recent Labs Lab 03/19/15 0940 03/20/15 0440  WBC 4.2 4.0  HGB 13.6 11.7*  HCT 41.2 37.0  PLT 190 174    Recent Labs Lab 03/19/15 0939 03/20/15 0440 03/21/15 0350  NA 140 137 137  K 3.8 4.0 4.0  CL 104 104 105  CO2 25 24 25   BUN 10 12 12   CREATININE 1.31* 1.21* 1.28*  CALCIUM 9.2 8.6* 9.0  PROT 6.5  --   --   BILITOT 0.6  --   --   ALKPHOS 80  --   --   ALT 19  --   --   AST 23  --   --   GLUCOSE 89 130* 127*   i-stat trop 0.05 PT 13.4, INR 1.00  Imaging/Diagnostic Tests: CXR (11/8): Stable mild cardiomegaly, no acute cardiopulmonary abnormality. MRI brain (11/8): Acute subcentimeter left frontal cortical infarct without hemorrhage. This is adjacent to, but slightly different location from, the previous infarct in Oct. Resolving R frontal infarct which was acute in Oct as well. EKG (11/8): NSR Nuclear stress test (11/9): normal  Campbell StallKaty Dodd Huxley Vanwagoner, MD 03/21/2015, 6:40 AM PGY-1, [facility] FPTS Intern pager: [phone], text pages welcome

## 2015-03-21 NOTE — Progress Notes (Signed)
Preliminary results by tech - Bilateral Upper ext. Arterial Doppler completed. Bilateral upper extremities demonstrated normal triphasic waveforms with normal pressures. Marilynne Halstedita Nuriyah Hanline, BS, RDMS, RVT

## 2015-03-21 NOTE — Progress Notes (Signed)
Preliminary results by tech - Left Upper Ext. Venous Duplex Completed. Negative for deep and superficial vein thrombosis in the left upper extremity. Aimar Shrewsbury, BS, RDMS, RVT  

## 2015-03-21 NOTE — Evaluation (Signed)
Physical Therapy Evaluation Patient Details Name: Tanya Mayer MRN: [id number] DOB: June 07, 1980 Today's Date: 03/21/2015   History of Present Illness  34 year old female w/ PMH of DM2, CVA 3 (2015, 12/2014, 02/2015), epilepsy, schizophrenia, depression, bipolar disorder, and homelessness who presents to [facility] ED on 03/19/2015 for evaluation of chest pain. MRI Positive for Acute subcentimeter LEFT frontal cortical infarct without hemorrhage.   Clinical Impression  Pt admitted with above diagnosis. Pt currently with functional limitations due to the deficits listed below (see PT Problem List). Pt continues to have several inconsistencies throughout PT eval, unclear of pt's effort provided.  Limited mobility as pt's HR was just recently up to 150's w/ light AROM.  She has been living at homeless shelter at [facility] I level and anticipate that as long as pt is motivated and progresses she will be able to return at mod I level as before. Currently requiring min assist w/ stand pivot transfer. Pt will benefit from skilled PT to increase their independence and safety with mobility to allow discharge to the venue listed below.      Follow Up Recommendations No PT follow up;Supervision/Assistance - 24 hour    Equipment Recommendations  Rolling walker with 5" wheels (has been using RW that homeless shelter owns)    Recommendations for Other Services       Precautions / Restrictions Precautions Precautions: Fall Precaution Comments: Pt denies falls  Restrictions Weight Bearing Restrictions: No      Mobility  Bed Mobility Overal bed mobility: Needs Assistance Bed Mobility: Sit to Supine     Supine to sit: Max assist Sit to supine: Supervision   General bed mobility comments: Supervision to return to bed.  Pt uses Rt UE to assist Lt LE into bed while only moving Lt UE enough to keep it out of the way.  Once pt supine she Ind adjusts by moving Lt UE and LE to become comfortable in  bed.  Transfers Overall transfer level: Needs assistance Equipment used: None Transfers: Sit to/from [employer] Sit to Stand: Min guard Stand pivot transfers: Min assist       General transfer comment: Min assist as pt's balance is somewhat unpredictable.  Flexed trunk and pt staggering, min assist to ensure pt does not lose balance and fall.   Ambulation/Gait             General Gait Details: deferred as pt's HR up to 150's working on Lt UE exercises sitting EOB w/ OT just prior to PT session.  Stairs            Wheelchair Mobility    Modified Rankin (Stroke Patients Only) Modified Rankin (Stroke Patients Only) Pre-Morbid Rankin Score: Slight disability Modified Rankin: Moderately severe disability     Balance Overall balance assessment: Needs assistance Sitting-balance support: Feet supported Sitting balance-Leahy Scale: Good Sitting balance - Comments: Pt initially requiring min A, but quickly progressed to independence once engaged in acctivity    Standing balance support: No upper extremity supported;During functional activity Standing balance-Leahy Scale: Poor Standing balance comment: Min assist (use of gait belt) at times to ensure pt remained stable                             Pertinent Vitals/Pain Pain Assessment: 0-10 Pain Score: 8  Faces Pain Scale: No hurt Pain Location: "all over" Pain Descriptors / Indicators: Discomfort;Aching Pain Intervention(s): Limited activity within patient's tolerance;Monitored during session  Home Living Family/patient expects to be discharged to:: Shelter/Homeless                 Additional Comments: Pt and spouse reside at [facility].  Per pt report there is an elevator so she doesn't need to take the stairs.    Prior Function Level of Independence: Independent with assistive device(s)         Comments: PTA using RW for ambulation w/o assist or supervision      Hand Dominance   Dominant Hand: Left    Extremity/Trunk Assessment   Upper Extremity Assessment: Defer to OT evaluation       LUE Deficits / Details: Pt initially holding Lt UE against body with wrist flexed and fingers curled.  Made no attempt to use Lt UE to assist with bed mobility allowing it to hang. When asked to reach for target, she will extend her index finger, but demonstrates very minimal shoulder flexion.  When asked to touch her mouth she demonstrates ~100* elbow flexion then states she is unable to move it further.  When aske do perform leg slides with Lt UE, she is able to wiggle fingers while fully extended and slide Lt UE forward and back on her leg slowly, but without difficulty.  When elbow supported, she was able to perform overhead movement (elbow flex/ext) actively and as support gradually removed was able to hold Lt UE over her head without support.  When she decided to order her meal, she was able to manipulate menu in the Lt UE without diffiulty including reaching forward and to the side to ~40* with good isolation of UE and digits.    Lower Extremity Assessment: LLE deficits/detail RLE Deficits / Details: With MMT grossly 4/5 strength throughout; however when asked to perform marching in sitting pt is barely able to lift Rt foot from floor.  Hoover's sign (-)  LLE Deficits / Details: Pt w/ Lt hemiparesis at baseline.  With MMT DF 2+/5, knee flexion 2/5, knee extension 1/5, hip flexion 0/5. However, functionally pt is able to transfer from sitting>supine initially using Rt UE to assist Lt LE into bed but once supine pt actively abducts Lt LE to position comfortably in bed.    Cervical / Trunk Assessment: Other exceptions  Communication   Communication: Expressive difficulties (speech slurred)  Cognition Arousal/Alertness: Awake/alert Behavior During Therapy: WFL for tasks assessed/performed Overall Cognitive Status: Within Functional Limits for tasks assessed                       General Comments General comments (skin integrity, edema, etc.): Pt continues to have several inconsistencies throughout PT eval, unclear of pt's effort provided.  Limited mobility as pt's HR was just recently up to 150's w/ light AROM.     Exercises General Exercises - Lower Extremity Ankle Circles/Pumps: AROM;10 reps;Right;Seated Long Arc Quad: AROM;Both;5 reps;Seated;Limitations Long [employer] Limitations: Lt AROM limited to ~ 20 deg motion Hip Flexion/Marching: AROM;Right;5 reps;Seated;Limitations Hip Flexion/Marching Limitations: Pt barely able to achieve Rt foot off floor. Instruct pt to attempt marching w/ Lt LE, pt unable, no muscle activation noted w/ palpation.      Assessment/Plan    PT Assessment Patient needs continued PT services  PT Diagnosis Hemiplegia dominant side   PT Problem List Decreased strength;Decreased range of motion;Decreased balance;Decreased activity tolerance;Decreased mobility;Decreased knowledge of use of DME;Decreased safety awareness;Decreased knowledge of precautions;Impaired sensation;Pain  PT Treatment Interventions DME instruction;Gait training;Stair training;Functional mobility training;Therapeutic activities;Therapeutic exercise;Balance  training;Neuromuscular re-education;Patient/family education   PT Goals (Current goals can be found in the Care Plan section) Acute Rehab PT Goals Patient Stated Goal: not stated PT Goal Formulation: With patient/family Time For Goal Achievement: 04/11/15 Potential to Achieve Goals: Good    Frequency Min 4X/week   Barriers to discharge        Co-evaluation               End of Session Equipment Utilized During Treatment: Gait belt Activity Tolerance: Patient tolerated treatment well Patient left: in bed;with call bell/phone within reach;with family/visitor present Nurse Communication: Mobility status;Precautions         Time: [id number] PT Time Calculation (min)  (ACUTE ONLY): 12 min   Charges:   PT Evaluation $Initial PT Evaluation Tier I: 1 Procedure     PT G CodesMichail Jewels:       Josaiah Muhammed Parr PT, DPT [phone] Pager: [phone] 03/21/2015, 4:27 PM

## 2015-03-21 NOTE — Evaluation (Addendum)
Occupational Therapy Evaluation Patient Details Name: Tanya Mayer MRN: [id number] DOB: [birth year] Today's Date: 03/21/2015    History of Present Illness 34 year old female w/ PMH of DM2, CVA 3 (2015, 12/2014, 02/2015), epilepsy, schizophrenia, depression, bipolar disorder, and homelessness who presents to [facility] ED on 03/19/2015 for evaluation of chest pain. MRI Positive for Acute subcentimeter LEFT frontal cortical infarct without hemorrhage.    Clinical Impression   Pt admitted with above. She demonstrates the below listed deficits and will benefit from continued OT to maximize safety and independence with BADLs.  Pt presented to OT with poor effort and inconsistencies with performance throughout.  When asked to move her lt UE on command, or perform a task on a command, she gave very poor effort, but when engaged in activity, and distracted, she demonstrates fluid, isolated movement of Lt. UE and good trunk control while on EOB (previously had been losing balance to the Rt).   Based on her performance when asked, she required mod A, overall with ADLs, and min A with functional transfers, however, based on performance when distracted, I anticipate she is very close to her baseline level of functioning and should be able to progress back to mod I fairly quickly.  HR did increase to 151(sustained) with EOB overhead Lt UE arm movment  So eval limited to EOB and stand pivot transfer to [facility]      Follow Up Recommendations  No OT follow up;Supervision/Assistance - 24 hour    Equipment Recommendations  None recommended by OT    Recommendations for Other Services       Precautions / Restrictions Precautions Precautions: Fall Precaution Comments: Pt denies falls  Restrictions Weight Bearing Restrictions: No      Mobility Bed Mobility Overal bed mobility: Needs Assistance Bed Mobility: Supine to Sit     Supine to sit: Max assist     General bed mobility comments: Pt with poor  effort, required asist to move LEs off bed and lift trunk  - pt appeared to resist movement at times   Transfers Overall transfer level: Needs assistance Equipment used: None Transfers: Sit to/from [employer] Sit to Stand: Min guard Stand pivot transfers: Min assist       General transfer comment: Pt stated her knee was going to buckle.  She flexed knee then extended it, but took 3 small quick steps back toward BSC as if she was going to fall, but did not loose balance and controlled her descent onto the commode    Balance Overall balance assessment: Needs assistance Sitting-balance support: Feet supported Sitting balance-Leahy Scale: Good Sitting balance - Comments: Pt initially requiring min A, but quickly progressed to independence once engaged in acctivity    Standing balance support: Single extremity supported Standing balance-Leahy Scale: Poor Standing balance comment: required HHA                            ADL Overall ADL's : Needs assistance/impaired Eating/Feeding: Set up;Sitting   Grooming: Wash/dry hands;Wash/dry face;Oral care;Set up;Sitting   Upper Body Bathing: Minimal assitance;Sitting   Lower Body Bathing: Moderate assistance;Sit to/from stand   Upper Body Dressing : Moderate assistance;Sitting   Lower Body Dressing: Minimal assistance;Sit to/from stand   Toilet Transfer: Minimal assistance;BSC;Stand-pivot Statistician Details (indicate cue type and reason): Pt states her Lt knee is going to buckle during transfer.  She flexed knee, then immediately extended with no LOB, but took three quick  steps back to the commode  Toileting- Clothing Manipulation and Hygiene: Minimal assistance;Sit to/from stand       Functional mobility during ADLs: Minimal assistance General ADL Comments: Inconsistencies noted throughout session,       Vision Vision Assessment?: No apparent visual deficits Additional Comments: Pt able to read menu  and dial phone without difficulty    Perception Perception Perception Tested?: Yes   Praxis Praxis Praxis tested?: Within functional limits    Pertinent Vitals/Pain Pain Assessment: Faces Faces Pain Scale: No hurt     Hand Dominance Left   Extremity/Trunk Assessment Upper Extremity Assessment Upper Extremity Assessment: LUE deficits/detail LUE Deficits / Details: Pt initially holding Lt UE against body with wrist flexed and fingers curled.  Made no attempt to use Lt UE to assist with bed mobility allowing it to hang. When asked to reach for target, she will extend her index finger, but demonstrates very minimal shoulder flexion.  When asked to touch her mouth she demonstrates ~100* elbow flexion then states she is unable to move it further.  When aske do perform leg slides with Lt UE, she is able to wiggle fingers while fully extended and slide Lt UE forward and back on her leg slowly, but without difficulty.  When elbow supported, she was able to perform overhead movement (elbow flex/ext) actively and as support gradually removed was able to hold Lt UE over her head without support.  When she decided to order her meal, she was able to manipulate menu in the Lt UE without diffiulty including reaching forward and to the side to ~40* with good isolation of UE and digits.    Lower Extremity Assessment Lower Extremity Assessment: Defer to PT evaluation   Cervical / Trunk Assessment Cervical / Trunk Assessment: Other exceptions Cervical / Trunk Exceptions: Pt initially with Rt lateral lean/flexion, but when she engaged in functinal task, pt righted trunk with good symmetry and isolated movements    Communication Communication Communication: Expressive difficulties (speech slurred )   Cognition Arousal/Alertness: Awake/alert Behavior During Therapy: WFL for tasks assessed/performed Overall Cognitive Status: Within Functional Limits for tasks assessed                     General  Comments       Exercises       Shoulder Instructions      Home Living Family/patient expects to be discharged to:: Shelter/Homeless                                 Additional Comments: Pt and spouse reside at [facility].    Lives With: Spouse    Prior Functioning/Environment Level of Independence: Independent with assistive device(s)        Comments: Reports she ambulates with a used walker and stands to shower.  She reports she is mod I with ADLs     OT Diagnosis: Hemiplegia dominant side   OT Problem List: Decreased strength;Decreased range of motion;Impaired balance (sitting and/or standing);Impaired UE functional use   OT Treatment/Interventions: Self-care/ADL training;Neuromuscular education;DME and/or AE instruction;Therapeutic activities;Patient/family education;Balance training    OT Goals(Current goals can be found in the care plan section) Acute Rehab OT Goals Patient Stated Goal: not stated OT Goal Formulation: With patient Time For Goal Achievement: 03/28/15 Potential to Achieve Goals: Good ADL Goals Pt Will Perform Eating: with modified independence;sitting Pt Will Perform Grooming: with modified independence;standing Pt Will Perform Upper  Body Bathing: with modified independence;sitting Pt Will Perform Lower Body Bathing: with modified independence;sit to/from stand Pt Will Perform Upper Body Dressing: with modified independence;sitting Pt Will Perform Lower Body Dressing: with modified independence;sit to/from stand Pt Will Transfer to Toilet: with modified independence;ambulating;regular height toilet;bedside commode;grab bars Pt Will Perform Toileting - Clothing Manipulation and hygiene: with modified independence;sit to/from stand Pt Will Perform Tub/Shower Transfer: Shower transfer;with modified independence;ambulating;rolling walker  OT Frequency: Min 2X/week   Barriers to D/C:            Co-evaluation              End of  Session Equipment Utilized During Treatment: Engineer, waterolling walker Nurse Communication: Mobility status  Activity Tolerance: Patient tolerated treatment well Patient left: Other (comment) (with PT )   Time: 1191-47821509-1532 OT Time Calculation (min): 23 min Charges:  OT General Charges $OT Visit: 1 Procedure OT Evaluation $Initial OT Evaluation Tier I: 1 Procedure OT Treatments $Self Care/Home Management : 8-22 mins G-Codes:    Baylee Mccorkel M 03/21/2015, 4:02 PM

## 2015-03-21 NOTE — Plan of Care (Signed)
Problem: Self-Care: Goal: Ability to communicate needs accurately will improve Outcome: Progressing Pt a/o X 3 able to communicate needs and discomforts. Pt was able to articulate needs without slurred speech.

## 2015-03-21 NOTE — Plan of Care (Signed)
Problem: Education: Goal: Knowledge of patient specific risk factors addressed and post discharge goals established will improve Outcome: Progressing Pt is a/o X 3 able to communicate needs and discomforts. No c/o pain at this time, will continue to monitor pt status and provide emotional support as needed

## 2015-03-21 NOTE — Progress Notes (Signed)
11/10 2004 Medicated with 2 mg IV Morphine slow IVP for c/o chest pain radiating to R. Shoulder that she has been having, will continue to monitor for effectiveness. 2015 Pt sleeping at this time and stood over her for a few minutes with no s/s of distress/discomfort noted, will continue to monitor. 2105 Pt called out and said that she is still having pain, spoke with Sam the RN that I am orienting with and gave her an additional 2 mg IV Morphine but instructed her that she would not be able to have anything else until 1am because she's allowed 2-4 mg IV every 4 hours as needed and that she would have to call for it, will continue to monitor. 2130 Pt is sleeping at this time with no s/s of distress/discomfort, will continue to monitor.

## 2015-03-21 NOTE — Care Management (Signed)
[facility] [place]:  This Case Manager case conferenced with Jacqlyn Krauss, RN CM who indicated patient may benefit from information about the [facility] as patient does not have a PCP. Met with patient at bedside, and patient confirmed she does not have a PCP.  Patient indicated she has [employer]. Inquired if patient has Medicaid as a secondary insurance because patient listed as have Medicaid in EPIC. She indicated she only has BCBS and does not have Medicaid. Informed patient she will need to ensure she gives the Inpatient Case Manager a copy of her current insurance card so her insurance information can be updated. Patient verbalized understanding.  Informed patient of the [facility] and informed patient that this Case Manager could obtain a hospital follow-up appointment for her. She declined and indicated she did not want to follow-up at [facility] and [facility].  Jacqlyn Krauss, RN CM updated.

## 2015-03-21 NOTE — Progress Notes (Signed)
Patient Name: Tanya Mayer Date of Encounter: 03/21/2015  Active Problems:   Stroke [facility](HCC)   CVA (cerebral infarction)   Chest pain   Cerebral infarction due to unspecified mechanism   Pain   HLD (hyperlipidemia)   Primary Cardiologist: Dr. 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  SUBJECTIVE: Difficult to awake from sleep. Denies any pain this morning or trouble breathing. Says she did not sleep well last night and is very tired this morning.  OBJECTIVE Filed Vitals:   03/20/15 1600 03/20/15 2000 03/20/15 2330 03/21/15 0430  BP:  104/63 105/55 108/65  Pulse:  93 73 78  Temp: 98.7 F (37.1 C) 98.6 F (37 C) 98.8 F (37.1 C) 99.6 F (37.6 C)  TempSrc: Oral Oral Oral Oral  Resp:  20 18 19   Height:      Weight:    166 lb 9.6 oz (75.569 kg)  SpO2:  96% 97% 96%    Intake/Output Summary (Last 24 hours) at 03/21/15 0801 Last data filed at 03/21/15 0600  Gross per 24 hour  Intake   2550 ml  Output   2050 ml  Net    500 ml   Filed Weights   03/19/15 1640 03/20/15 0435 03/21/15 0430  Weight: 169 lb 8 oz (76.885 kg) 165 lb 6.4 oz (75.025 kg) 166 lb 9.6 oz (75.569 kg)    PHYSICAL EXAM General: Well developed, well nourished, female in no acute distress. Head: Normocephalic, atraumatic.  Neck: Supple without bruits, JVD not elevated. Lungs:  Resp regular and unlabored, CTA without wheezing or rales. Heart: RRR, S1, S2, no S3, S4, or murmur; no rub. Abdomen: Soft, non-tender, non-distended with normoactive bowel sounds. No hepatomegaly. No rebound/guarding. No obvious abdominal masses. Extremities: No clubbing, cyanosis, or edema. Distal pedal pulses are 2+  bilaterally. 1+ radial pulse on left, 2+ on right. Neuro: Alert and oriented X 3. Moves all extremities spontaneously. Still having slurred speech. Psych: Normal affect.   LABS: CBC: Recent Labs  03/19/15 0940 03/20/15 0440  WBC 4.2 4.0  HGB 13.6 11.7*  HCT 41.2 37.0  MCV 84.9 86.4  PLT 190 174   INR: Recent Labs  03/19/15 0939  INR 1.00   Basic Metabolic Panel: Recent Labs  03/20/15 0440 03/21/15 0350  NA 137 137  K 4.0 4.0  CL 104 105  CO2 24 25  GLUCOSE 130* 127*  BUN 12 12  CREATININE 1.21* 1.28*  CALCIUM 8.6* 9.0   Liver Function Tests: Recent Labs  03/19/15 0939  AST 23  ALT 19  ALKPHOS 80  BILITOT 0.6  PROT 6.5  ALBUMIN 3.3*   Cardiac Enzymes: Recent Labs  03/19/15 1720 03/19/15 2346 03/20/15 0440  TROPONINI 0.06* 0.06* 0.07*    Recent Labs  03/19/15 0938  TROPIPOC 0.05   BNP: No results found for: BNP No results found for: PROBNP D-dimer: Recent Labs  03/19/15 2334  DDIMER 1.04*   TELE:  NSR with rate mostly in 70's 90's. Tachycardiac into 110's at times. No atopic events.   Radiology/Studies: Dg Chest 2 View: 03/19/2015  CLINICAL DATA:  34 year old female with left chest pain since yesterday afternoon radiating to the arm and back. Initial encounter. EXAM: CHEST  2 VIEW COMPARISON:  02/27/2015, 01/03/2015. FINDINGS: Seated AP and lateral views of the chest. Mild cardiomegaly is stable. Mildly lower lung volumes. Other mediastinal contours are within normal limits. No pneumothorax, pulmonary edema or pleural effusion. No consolidation. Conjoined left fourth and fifth ribs. No acute pulmonary opacity. No acute osseous abnormality identified. IMPRESSION: Stable mild cardiomegaly.  No acute cardiopulmonary abnormality. Electronically Signed   By: Odessa Fleming M.D.   On: 03/19/2015 09:15   Mr Brain Wo Contrast (neuro Protocol): 03/19/2015  CLINICAL DATA:  LEFT-sided chest pain with slurred speech. History of stroke. Symptom onset earlier today.  EXAM: MRI HEAD WITHOUT CONTRAST TECHNIQUE: Multiplanar, multiecho pulse sequences of the brain and surrounding structures were obtained without intravenous contrast. COMPARISON:  MR brain 02/27/2015.  No recent CT head. FINDINGS: Previously identified 2.5 cm RIGHT frontal cortex infarction has now normalized, with only slight brain substance loss and residual gliosis. Tiny LEFT frontal cortical infarct noted previously, only a few mm in diameter, has also normalized. On diffusion imaging today, there is an acute subcentimeter LEFT frontal cortical infarct quite near where the previous insult was located. See image 32 series 4. This involves LEFT MCA territory and is nonhemorrhagic. No other areas of definite restricted diffusion are observed. No acute hemorrhage, mass lesion, hydrocephalus, or extra-axial fluid. Normal for age cerebral volume. No significant white matter disease. No foci of chronic hemorrhage. No midline abnormality. Extracranial soft tissues unremarkable. IMPRESSION: Acute subcentimeter LEFT frontal cortical infarct without hemorrhage. This is adjacent to, but slightly different location from, the previous infarction noted in October. Resolving RIGHT frontal infarction which was acute in October as well. Electronically Signed   By: Elsie Stain M.D.   On: 03/19/2015 10:38   Nm Myocar Multi W/spect W/wall Motion / Ef: 03/20/2015   There was no ST segment deviation noted during stress.  The study is normal.  This is a low risk study.  The left ventricular ejection fraction is normal (55-65%).  Nuclear stress EF: 64%.      Current Medications:  . aspirin EC  81 mg Oral Daily  . atorvastatin  40 mg Oral q1800  . clopidogrel  75 mg Oral Daily  . gabapentin  900 mg Oral TID  . heparin  5,000 Units Subcutaneous 3 times per day  . lidocaine (PF)  5 mL Intradermal Once  . nicotine  14 mg Transdermal Daily  . QUEtiapine  300 mg Oral QHS  . regadenoson  0.4 mg Intravenous Once  .  risperiDONE  0.25 mg Oral BID  . senna  1 tablet Oral BID  . sodium chloride  3 mL Intravenous Q12H  . trazodone  300 mg Oral QHS   . sodium chloride 10 mL/hr at 03/21/15 0600    ASSESSMENT AND PLAN:  1. Atypical Chest Pain - has been constant since 0700 on the day of admission. Relieved with administration of pain medication while in the ED. - initial troponin negative. Cyclic cardiac enzymes have been 0.06, 0.06, and 0.07. - EKG without any acute ST or T wave changes. - NST on 03/20/2015 was low risk with no evidence of ischemia.  - continue ASA, statin, and Plavix.   2. Acute subcentimeter left frontal cortical infarct without hemorrhage  - noted on MRI on 03/19/2015 - recent CVA's in 2015, 12/2014, and 02/2015. - management per neurology and primary team - has known PFO. Follow-up with PFO Closure Team (Dr. Excell Seltzer) on 03/29/2015.  3. Weak Left Radial Pulse - upper extremity  dopplers pending.   Will follow-up with PFO closure team in the office next week. No other inpatient cardiac workup indicated.  Lorri Frederick , PA-C 8:01 AM 03/21/2015 Pager: [phone]  Agree with note by Reita May  MV low risk/non ischemic. Non cardiac CP. Enz neg. Will S/O and arrange OP F/U to further eval ASD/PFO closure for recurrent CVA.  Runell Gess, M.D., FACP, [facility], [place] [facility] [address] [place], [place]  [address]  [phone] 03/21/2015 9:23 AM

## 2015-03-22 DIAGNOSIS — I10 Essential (primary) hypertension: Secondary | ICD-10-CM | POA: Insufficient documentation

## 2015-03-22 LAB — GLUCOSE, CAPILLARY: Glucose-Capillary: 115 mg/dL — ABNORMAL HIGH (ref 65–99)

## 2015-03-22 MED ORDER — ASPIRIN 81 MG PO TBEC: 81.0000 mg | DELAYED_RELEASE_TABLET | Freq: Every day | ORAL | Status: DC

## 2015-03-22 NOTE — Progress Notes (Signed)
Physical Therapy Treatment Patient Details Name: Tanya Mayer MRN: [id number] DOB: Sep 02, 1980 Today's Date: 03/22/2015    History of Present Illness 34 year old female w/ PMH of DM2, CVA 3 (2015, 12/2014, 02/2015), epilepsy, schizophrenia, depression, bipolar disorder, and homelessness who presents to [facility] on 03/19/2015 for evaluation of chest pain. MRI Positive for Acute subcentimeter LEFT frontal cortical infarct without hemorrhage.     PT Comments    Pt doing well with rollator. From PT standpoint ok for DC.  Follow Up Recommendations  No PT follow up;Supervision - Intermittent (until pt in an apt next week and then can have HHPT if needed)     Equipment Recommendations  Other (comment) (rollator - already delivered)    Recommendations for Other Services       Precautions / Restrictions Precautions Precautions: Fall Precaution Comments: Pt denies falls  Restrictions Weight Bearing Restrictions: No    Mobility  Bed Mobility Overal bed mobility: Modified Independent Bed Mobility: Sit to Supine;Supine to Sit     Supine to sit: Modified independent (Device/Increase time);HOB elevated Sit to supine: Modified independent (Device/Increase time);HOB elevated   General bed mobility comments: Pt moved all extremities on/off bed on their own  Transfers Overall transfer level: Modified independent Equipment used: 4-wheeled walker Transfers: Sit to/from Stand Sit to Stand: Modified independent (Device/Increase time) Stand pivot transfers: Supervision       General transfer comment: Stood from bed, from toilet, and from seat of walker on her own.  Ambulation/Gait Ambulation/Gait assistance: Modified independent (Device/Increase time) Ambulation Distance (Feet): 250 Feet Assistive device: 4-wheeled walker Gait Pattern/deviations: Step-through pattern (Pt with some circumduction of LE's at times with gait)   Gait velocity interpretation: Below normal speed for  age/gender General Gait Details: Pt with steady gait when using rollator. Able to manuever rollator using both UE's without problems. Pt able to lock brakes and sit on rollator as needed.   Stairs            Wheelchair Mobility    Modified Rankin (Stroke Patients Only) Modified Rankin (Stroke Patients Only) Pre-Morbid Rankin Score: Slight disability Modified Rankin: Moderate disability     Balance Overall balance assessment: Needs assistance Sitting-balance support: Feet supported Sitting balance-Leahy Scale: Good     Standing balance support: No upper extremity supported;During functional activity Standing balance-Leahy Scale: Fair (to good) Standing balance comment: Pt reached across body to retrieve items and bent toward knees without LOB, rotated Lt and Rt to retrieve items and to look at TV.  However, when not engaged in activity, she would "buckle" Lt knee, and would hold onto edge of sink                     Cognition Arousal/Alertness: Awake/alert Behavior During Therapy: WFL for tasks assessed/performed;Flat affect Overall Cognitive Status: Within Functional Limits for tasks assessed                      Exercises      General Comments        Pertinent Vitals/Pain Pain Assessment: Faces Faces Pain Scale: No hurt Pain Location: Lt leg and back  Pain Descriptors / Indicators: Aching Pain Intervention(s): Monitored during session    Home Living       Type of Home:  (shelter)              Prior Function            PT Goals (current goals can  now be found in the care plan section) Acute Rehab PT Goals Patient Stated Goal: not stated Progress towards PT goals: Goals met/education completed, patient discharged from PT    Frequency       PT Plan      Co-evaluation             End of Session Equipment Utilized During Treatment: Gait belt Activity Tolerance: Patient tolerated treatment well Patient left: in bed;with  call bell/phone within reach;with family/visitor present;with bed alarm set     Time: 7628-3151 PT Time Calculation (min) (ACUTE ONLY): 17 min  Charges:  $Gait Training: 8-22 mins                    G Codes:      Tanya Mayer 2015-04-13, 4:53 PM [employer] PT [phone]

## 2015-03-22 NOTE — Evaluation (Signed)
Occupational Therapy Evaluation Patient Details Name: Tanya Mayer MRN: [id number] DOB: 06-Oct-1980 Today's Date: 03/22/2015    History of Present Illness 34 year old female w/ PMH of DM2, CVA 3 (2015, 12/2014, 02/2015), epilepsy, schizophrenia, depression, bipolar disorder, and homelessness who presents to [facility] ED on 03/19/2015 for evaluation of chest pain. MRI Positive for Acute subcentimeter LEFT frontal cortical infarct without hemorrhage.    Clinical Impression   Pt was able to stand at sink for 25 mins with one seated rest break and min guard assist while performing bathing and dressing.   She continues with inconsistencies during session.  When not engaged in activity, she does not use Lt UE, and will "buckle" her Lt knee, but when involved in activity,  Movement of Lt. UE and Lt LE fluid with good control.  Continue to feel she will quickly progress to modified independent level.     Follow Up Recommendations  No OT follow up;Supervision/Assistance - 24 hour    Equipment Recommendations  None recommended by OT    Recommendations for Other Services       Precautions / Restrictions Precautions Precautions: Fall Precaution Comments: Pt denies falls       Mobility Bed Mobility Overal bed mobility: Modified Independent             General bed mobility comments: Pt lifts Lt LE up with bil. UEs to move it onto and off of bed   Transfers Overall transfer level: Needs assistance Equipment used: Rolling walker (2 wheeled) Transfers: Sit to/from [employer] Sit to Stand: Supervision Stand pivot transfers: Supervision            Balance Overall balance assessment: Needs assistance Sitting-balance support: Feet supported Sitting balance-Leahy Scale: Good     Standing balance support: During functional activity Standing balance-Leahy Scale: Fair (Fair to good ) Standing balance comment: Pt reached across body to retrieve items and bent  toward knees without LOB, rotated Lt and Rt to retrieve items and to look at TV.  However, when not engaged in activity, she would "buckle" Lt knee, and would hold onto edge of sink                             ADL       Grooming: Wash/dry hands;Wash/dry face;Min guard;Standing   Upper Body Bathing: Min guard;Standing   Lower Body Bathing: Min guard;Sit to/from stand   Upper Body Dressing : Sitting;Supervision/safety   Lower Body Dressing: Min guard;Sit to/from stand   Toilet Transfer: Min guard;Ambulation;Regular Toilet;Grab bars;RW   Toileting- Architect and Hygiene: Min guard;Sit to/from stand       Functional mobility during ADLs: Min guard General ADL Comments: Pt continues with inconsistencies throughout session.  She walks slowly and buckles her Lt knee when she is asked to do a task (does not loose balance),  However, when she had to walk into the bathroom to urinate, gait was smooth, with no buckling and with normal speed.  She uses Rt hand to lift Lt hand up and will attempt to primarily use Rt UE for tasks until it is a task that requires use of bilateral hands, and then she demonstrates fluid isolated movement with no evidence of deficit.   She was able to stand at sink x 25 mins with one seated rest break to bathe      Vision Vision Assessment?: No apparent visual deficits   Perception  Praxis      Pertinent Vitals/Pain Pain Assessment: Faces Faces Pain Scale: Hurts little more Pain Location: Lt leg and back  Pain Descriptors / Indicators: Aching Pain Intervention(s): Monitored during session     Hand Dominance     Extremity/Trunk Assessment             Communication     Cognition Arousal/Alertness: Awake/alert Behavior During Therapy: WFL for tasks assessed/performed Overall Cognitive Status: Within Functional Limits for tasks assessed                     General Comments       Exercises       Shoulder  Instructions      Home Living                                          Prior Functioning/Environment               OT Diagnosis:     OT Problem List:     OT Treatment/Interventions:      OT Goals(Current goals can be found in the care plan section) ADL Goals Pt Will Perform Eating: with modified independence;sitting Pt Will Perform Grooming: with modified independence;standing Pt Will Perform Upper Body Bathing: with modified independence;sitting Pt Will Perform Lower Body Bathing: with modified independence;sit to/from stand Pt Will Perform Upper Body Dressing: with modified independence;sitting Pt Will Perform Lower Body Dressing: with modified independence;sit to/from stand Pt Will Transfer to Toilet: with modified independence;ambulating;regular height toilet;bedside commode;grab bars Pt Will Perform Toileting - Clothing Manipulation and hygiene: with modified independence;sit to/from stand Pt Will Perform Tub/Shower Transfer: Shower transfer;with modified independence;ambulating;rolling walker Pt/caregiver will Perform Home Exercise Program: Increased strength;Left upper extremity;With written HEP provided Additional ADL Goal #1: pt will transfer to comfort commode and toilet with S and walker  OT Frequency: Min 2X/week   Barriers to D/C:            Co-evaluation              End of Session Equipment Utilized During Treatment: Rolling walker Nurse Communication: Mobility status  Activity Tolerance: Patient tolerated treatment well Patient left: in bed;with call bell/phone within reach;with family/visitor present   Time: [id number] OT Time Calculation (min): 41 min Charges:  OT General Charges $OT Visit: 1 Procedure OT Treatments $Self Care/Home Management : 38-52 mins G-Codes:    Trishelle Devora M 03/22/2015, 2:08 PM

## 2015-03-22 NOTE — Progress Notes (Signed)
Family Medicine Teaching Service Daily Progress Note Intern Pager: [phone]  Patient name: Tanya Mayer Medical record number: [id number] Date of birth: [birth year] Age: 34 y.o. Gender: female  Primary Care Provider: No PCP Per Patient Consultants: Neurology, Cardiology Code Status: Full  Pt Overview and Major Events to Date:  11/8: Admitted to the FPTS  Assessment and Plan: Tanya Mayer is a 34 y.o. female presenting with new acute L frontal cortical infarct without hemorrhage and atypical chest pain. 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  Acute L frontal CVA: Pt endorsing worsening of her slurred speech and L hemiparesis from baseline. MRI brain showing acute left frontal cortical infarct without hemorrhage. Some concern at previous hospitalization for hypercoagulable state due to MRI findings consistent with emboli and history of DVT, although labs were negative. TEE at previous admission showed PFO and ASD. - Neurology consulted, appreciate recommendations. Pt will follow-up with Neuro as an outpatient to have LP done to rule out CNS vasculitis. This cannot be done while she is in the hospital because she is on Plavix. - Continue dual antiplatelet therapy with ASA and Plavix. - Last HgbA1c (02/2015): 5.7% - Last lipid panel (02/2015): Chol 177, TG 156, HDL 40, LDL 106. Continue Lipitor  qd. - PT recommended no PT follow-up, 24 hour supervision, rolling walker with 5" wheels. Will discuss with them today about Patient's situation to see if she would be more appropriately placed in a SNF or CIR, given her social situation. - OT recommended no OT follow-up, 24 hour supervision. - Will re-consult Speech Therapy today.  Atypical chest pain: Sharp, reproducible, left-sided pain. ACS is less likely, given the reproducibility of her pain, negative  I-stat troponin, and EKG showing NSR. Some concern for left upper extremity DVT, as Pt is very tender to palpation of her left upper extremity and she has a history of hypercoagulability, however upper extremity dopplers were negative for DVT. - Troponins elevated at 0.06 > 0.06 > 0.07. Of note, they were elevated at her previous admission up to 0.12. - Cardiology consulted, appreciate recommendations. Nuclear stress test was normal. - Pt has an appointment scheduled with PFO closure team on 11/18. - Morphine 2-4mg  q4hrs PRN and Tylenol for pain - Cardiac monitoring.  Fevers: Unknown origin. Pt spiked fevers yesterday to 100.5 and 101.6. Responded well to Tylenol and was otherwise afebrile.  - Will continue to monitor during the day today. - If she spikes fevers today, will consider getting a UA and CXR.  HTN: BPs ranging from 100-130/65-77 since admission. - Home HCTZ is being held in the setting of new CVA.  HLD: Last lipid panel (02/2015): Chol 177, TG 156, HDL 40, LDL 106. - Continue home med: Lipitor  qd  Schizophrenia/Bipolar disorder: Somewhat flattened affect on exam. - Continue home meds: Risperidone 0.25mg  bid, Seroquel  qd - Trazodone  for sleep  Tobacco abuse: Pt smoking 0.5-1 ppd.  - Nicotine patch  FEN/GI: Heart healthy diet. SLIV. Prophylaxis: sq heparin  Disposition: Discharge pending PT and ST evaluation.  Subjective:  Pt is frustrated this morning. She does not think that she can be discharged because her speech is still slurred. She would like to stay in the hospital until her speech returns back to normal. I explained her that this would not be possible. She states she cannot go home with HHPT because she cannot walk and she  has to leave the shelter every day at 6am.  Objective: Temp:  [97.9 F (36.6 C)-101.6 F (38.7 C)] 97.9 F (36.6 C) (11/11 0532) Pulse Rate:  [96-98] 96 (11/10 2000) Resp:  [16-18] 18 (11/11 0532) BP: (100-130)/(57-77)  130/77 mmHg (11/11 0532) SpO2:  [98 %-100 %] 100 % (11/11 0532) Weight:  [166 lb 11.7 oz (75.63 kg)] 166 lb 11.7 oz (75.63 kg) (11/11 0532) Physical Exam: General: Laying in bed, tired-appearing, eating breakfast, in NAD. HEENT: [place]/AT, EOMI, no scleral icterus, dry mucous membranes Neck: Supple Cardiovascular: RRR, no murmurs, rubs, or gallops. Radial pulses are equal bilaterally. Respiratory: CTAB, normal work of breathing, no wheezes Abdomen: +BS, soft, non-tender, non-distended MSK: No edema in the lower extremities bilaterally. Does not awaken to palpation of left upper extremity. Skin: no rashes Neuro: Sleeping throughout exam; does not move left side. Psych: Affect is somewhat flattened, appropriate behavior.  Laboratory:  Recent Labs Lab 03/19/15 0940 03/20/15 0440  WBC 4.2 4.0  HGB 13.6 11.7*  HCT 41.2 37.0  PLT 190 174    Recent Labs Lab 03/19/15 0939 03/20/15 0440 03/21/15 0350  NA 140 137 137  K 3.8 4.0 4.0  CL 104 104 105  CO2 25 24 25   BUN 10 12 12   CREATININE 1.31* 1.21* 1.28*  CALCIUM 9.2 8.6* 9.0  PROT 6.5  --   --   BILITOT 0.6  --   --   ALKPHOS 80  --   --   ALT 19  --   --   AST 23  --   --   GLUCOSE 89 130* 127*   i-stat trop 0.05 PT 13.4, INR 1.00  Imaging/Diagnostic Tests: CXR (11/8): Stable mild cardiomegaly, no acute cardiopulmonary abnormality. MRI brain (11/8): Acute subcentimeter left frontal cortical infarct without hemorrhage. This is adjacent to, but slightly different location from, the previous infarct in Oct. Resolving R frontal infarct which was acute in Oct as well. EKG (11/8): NSR Nuclear stress test (11/9): normal  Campbell StallKaty Dodd Soriah Leeman, MD 03/22/2015, 7:03 AM PGY-1, [facility] FPTS Intern pager: [phone], text pages welcome

## 2015-03-22 NOTE — Evaluation (Signed)
Speech Language Pathology Evaluation Patient Details Name: Tanya BerlinLatonya Wilborn MRN: [id number] DOB: 08/31/1980 Today's Date: 03/22/2015 Time: [id number] SLP Time Calculation (min) (ACUTE ONLY): 18 min  Problem List:  Patient Active Problem List   Diagnosis Date Noted  . Essential hypertension, benign   . Pain in the chest   . Pain   . HLD (hyperlipidemia)   . CVA (cerebral infarction) 03/19/2015  . Chest pain   . Cerebral infarction due to unspecified mechanism   . UTI (lower urinary tract infection)   . Cryptogenic stroke (HCC)   . Type 2 diabetes mellitus with diabetic neuropathy, with long-term current use of insulin (HCC)   . Acute thromboembolic cerebrovascular accident (CVA) (HCC)   . PFO (patent foramen ovale)   . 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 Schizophrenia (HCC)   . Bipolar I disorder (HCC)   . Seizure disorder (HCC)   . Acute encephalopathy 02/27/2015  . Stroke (HCC) 01/03/2015  . Essential hypertension 01/03/2015  . DM (diabetes mellitus) type II controlled, neurological manifestation (HCC) 01/03/2015  . Hyperlipidemia 01/03/2015  . Hemiparesis affecting left side as late effect of cerebrovascular accident (HCC) 01/03/2015  . Tobacco abuse 01/03/2015  . Marijuana abuse 01/03/2015  . Asthma 01/03/2015  . History of DVT of lower extremity 01/03/2015   Past Medical History:  Past Medical History  Diagnosis Date  . Hypertension   . Heart murmur     "born w/one"  . Hyperlipemia   . Anginal pain (HCC)   . DVT (deep venous thrombosis) (HCC) after 2008    "BLE"  . Bronchial asthma   . Anemia   . GERD (gastroesophageal reflux disease)   . Migraine     "weekly" (03/19/2015)  . Chronic lower back pain   . Bipolar disorder (HCC)   . Schizophrenia (HCC)   . DM (diabetes mellitus) type II uncontrolled, periph vascular disorder (HCC)     notes 01/03/2015  . Seizures (HCC)     epilepsy/notes 02/27/2015  . Stroke (HCC)  04/2014; 09/2014(/notes 04/25/2015);  12/2014; 01/2015; 02/2015    "left side weakness; speech problems since" (03/19/2015)  . Stroke (HCC) 03/19/2015    "left side weakness; speech problems" (03/19/2015)  . PFO (patent foramen ovale)     large, per TEE 03/01/2015   Past Surgical History:  Past Surgical History  Procedure Laterality Date  . 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 Tee without cardioversion N/A 03/01/2015    Procedure: TRANSESOPHAGEAL ECHOCARDIOGRAM (TEE);  Surgeon: Thurmon FairMihai Croitoru, MD;  [facility];  Service: Cardiovascular;  Laterality: N/A;  . Hernia repair    . Umbilical hernia repair  "@ birth"  . Cesarean section  2008  . Tubal ligation  200688   HPI:  34 year old female w/ PMH of DM2, CVA 3 (2015, 12/2014, 02/2015), epilepsy, schizophrenia, depression, bipolar disorder, and homelessness who [facility] for evaluation of chest pain. MRI Positive for Acute subcentimeter LEFT frontal cortical infarct without hemorrhage.    Assessment / Plan / Recommendation Clinical Impression  Cognitive-linguistic evaluation complete. Overall cognitive status appearing improved from most recent evaluation on admission in 02/2015 however patient presents with dysarthria which she reports is worse from discharge in October. No acute f/u needed however [facility] after [facility]/c.     SLP Assessment  All further Speech Lanaguage Pathology  needs can be addressed  in the next venue of care    Follow Up Recommendations  Home health SLP          SLP Evaluation Prior Functioning  Cognitive/Linguistic Baseline: Baseline deficits Baseline deficit details: most recent SLP evaluation 02/2015 noted dysarthria as well as short term recall of information (long term memory appears intact), awareness, sustained attention, and abstract thinking. Type of Home:  (shelter)  Lives With: Spouse   Cognition  Overall Cognitive Status: Within  Functional Limits for tasks assessed    Comprehension  Auditory Comprehension Overall Auditory Comprehension: Appears within functional limits for tasks assessed Yes/No Questions: Within Functional Limits Visual Recognition/Discrimination Discrimination: Not tested Reading Comprehension Reading Status: Not tested    Expression Expression Primary Mode of Expression: Verbal Verbal Expression Overall Verbal Expression: Appears within functional limits for tasks assessed Written Expression Dominant Hand: Left   Oral / Motor Oral Motor/Sensory Function Overall Oral Motor/Sensory Function: Moderate impairment Facial ROM: Reduced right;Reduced left Facial Symmetry: Within Functional Limits Facial Strength: Reduced right;Reduced left Facial Sensation: Reduced left (baseline) Lingual ROM: Within Functional Limits Lingual Symmetry: Within Functional Limits Lingual Strength: Reduced Lingual Sensation: Within Functional Limits Velum: Within Functional Limits Mandible: Impaired Motor Speech Overall Motor Speech: Impaired Respiration: Within functional limits Phonation: Normal Resonance: Within functional limits Articulation: Impaired Level of Impairment: Word Intelligibility: Intelligibility reduced Word: 75-100% accurate Phrase: 75-100% accurate Sentence: 75-100% accurate Conversation: 75-100% accurate Motor Planning: Witnin functional limits Motor Speech Errors: Aware Effective Techniques: Increased vocal intensity;Over-articulate   Ferdinand Lango MA, CCC-SLP [phone]  Ferdinand Lango Meryl 03/22/2015, 3:56 PM

## 2015-03-22 NOTE — Discharge Instructions (Signed)
You were hospitalized because you had a new stroke. Physical therapy and occupational therapy assessed you and felt that you were safe to return to the shelter. To prevent future strokes from occurring in the future, it is very important to stop smoking. We also recommend that you find a doctor that can prescribe you medications to help you from having another stroke. We have set up a [facility] at 2:30pm. Please make sure you attend this appointment. Your new physician can set up home health speech therapy for you.

## 2015-03-28 ENCOUNTER — Ambulatory Visit: Payer: Medicaid Other | Admitting: Family Medicine

## 2015-03-29 ENCOUNTER — Ambulatory Visit: Payer: Medicaid Other | Admitting: Cardiovascular Disease

## 2015-03-29 DIAGNOSIS — R0989 Other specified symptoms and signs involving the circulatory and respiratory systems: Secondary | ICD-10-CM

## 2015-04-01 ENCOUNTER — Encounter: Payer: Self-pay | Admitting: Cardiovascular Disease

## 2015-04-18 DIAGNOSIS — Z95828 Presence of other vascular implants and grafts: Secondary | ICD-10-CM | POA: Insufficient documentation

## 2015-06-30 ENCOUNTER — Encounter (HOSPITAL_COMMUNITY): Payer: Self-pay | Admitting: Emergency Medicine

## 2015-06-30 ENCOUNTER — Observation Stay (HOSPITAL_COMMUNITY)
Admission: EM | Admit: 2015-06-30 | Discharge: 2015-07-02 | Disposition: A | Discharge status: Home / Self Care | Payer: Medicaid Other | Attending: Internal Medicine | Admitting: Internal Medicine

## 2015-06-30 DIAGNOSIS — E1151 Type 2 diabetes mellitus with diabetic peripheral angiopathy without gangrene: Secondary | ICD-10-CM | POA: Insufficient documentation

## 2015-06-30 DIAGNOSIS — Z791 Long term (current) use of non-steroidal anti-inflammatories (NSAID): Secondary | ICD-10-CM | POA: Insufficient documentation

## 2015-06-30 DIAGNOSIS — N182 Chronic kidney disease, stage 2 (mild): Secondary | ICD-10-CM | POA: Insufficient documentation

## 2015-06-30 DIAGNOSIS — F17210 Nicotine dependence, cigarettes, uncomplicated: Secondary | ICD-10-CM | POA: Insufficient documentation

## 2015-06-30 DIAGNOSIS — T7808XA Anaphylactic reaction due to eggs, initial encounter: Principal | ICD-10-CM | POA: Insufficient documentation

## 2015-06-30 DIAGNOSIS — I10 Essential (primary) hypertension: Secondary | ICD-10-CM | POA: Diagnosis present

## 2015-06-30 DIAGNOSIS — E1122 Type 2 diabetes mellitus with diabetic chronic kidney disease: Secondary | ICD-10-CM | POA: Insufficient documentation

## 2015-06-30 DIAGNOSIS — Z9114 Patient's other noncompliance with medication regimen: Secondary | ICD-10-CM | POA: Insufficient documentation

## 2015-06-30 DIAGNOSIS — Z79899 Other long term (current) drug therapy: Secondary | ICD-10-CM | POA: Diagnosis not present

## 2015-06-30 DIAGNOSIS — I69328 Other speech and language deficits following cerebral infarction: Secondary | ICD-10-CM | POA: Insufficient documentation

## 2015-06-30 DIAGNOSIS — F319 Bipolar disorder, unspecified: Secondary | ICD-10-CM | POA: Diagnosis not present

## 2015-06-30 DIAGNOSIS — K219 Gastro-esophageal reflux disease without esophagitis: Secondary | ICD-10-CM | POA: Insufficient documentation

## 2015-06-30 DIAGNOSIS — Z86718 Personal history of other venous thrombosis and embolism: Secondary | ICD-10-CM | POA: Diagnosis not present

## 2015-06-30 DIAGNOSIS — I69354 Hemiplegia and hemiparesis following cerebral infarction affecting left non-dominant side: Secondary | ICD-10-CM | POA: Diagnosis not present

## 2015-06-30 DIAGNOSIS — Z7902 Long term (current) use of antithrombotics/antiplatelets: Secondary | ICD-10-CM | POA: Diagnosis not present

## 2015-06-30 DIAGNOSIS — Q211 Atrial septal defect: Secondary | ICD-10-CM | POA: Diagnosis not present

## 2015-06-30 DIAGNOSIS — E785 Hyperlipidemia, unspecified: Secondary | ICD-10-CM | POA: Insufficient documentation

## 2015-06-30 DIAGNOSIS — T781XXA Other adverse food reactions, not elsewhere classified, initial encounter: Secondary | ICD-10-CM | POA: Diagnosis present

## 2015-06-30 DIAGNOSIS — Z79891 Long term (current) use of opiate analgesic: Secondary | ICD-10-CM | POA: Insufficient documentation

## 2015-06-30 DIAGNOSIS — J45909 Unspecified asthma, uncomplicated: Secondary | ICD-10-CM | POA: Insufficient documentation

## 2015-06-30 DIAGNOSIS — I129 Hypertensive chronic kidney disease with stage 1 through stage 4 chronic kidney disease, or unspecified chronic kidney disease: Secondary | ICD-10-CM | POA: Insufficient documentation

## 2015-06-30 DIAGNOSIS — F209 Schizophrenia, unspecified: Secondary | ICD-10-CM | POA: Insufficient documentation

## 2015-06-30 DIAGNOSIS — R569 Unspecified convulsions: Secondary | ICD-10-CM | POA: Insufficient documentation

## 2015-06-30 DIAGNOSIS — Z7982 Long term (current) use of aspirin: Secondary | ICD-10-CM | POA: Diagnosis not present

## 2015-06-30 DIAGNOSIS — T782XXA Anaphylactic shock, unspecified, initial encounter: Secondary | ICD-10-CM | POA: Diagnosis present

## 2015-06-30 MED ORDER — EPINEPHRINE 0.3 MG/0.3ML IJ SOAJ
INTRAMUSCULAR | Status: AC
  Filled 2015-06-30: qty 0.3

## 2015-06-30 MED ORDER — LORAZEPAM 2 MG/ML IJ SOLN
0.5000 mg | Freq: Once | INTRAMUSCULAR | Status: AC
  Administered 2015-06-30: 0.5 mg via INTRAVENOUS

## 2015-06-30 MED ORDER — EPINEPHRINE 0.3 MG/0.3ML IJ SOAJ
0.3000 mg | Freq: Once | INTRAMUSCULAR | Status: AC
  Administered 2015-06-30: 0.3 mg via INTRAMUSCULAR

## 2015-06-30 MED ORDER — FAMOTIDINE IN NACL 20-0.9 MG/50ML-% IV SOLN
20.0000 mg | Freq: Once | INTRAVENOUS | Status: AC
  Administered 2015-06-30: 20 mg via INTRAVENOUS

## 2015-06-30 MED ORDER — LORAZEPAM 2 MG/ML IJ SOLN
INTRAMUSCULAR | Status: AC
  Filled 2015-06-30: qty 1

## 2015-06-30 MED ORDER — LORAZEPAM 1 MG PO TABS
0.5000 mg | ORAL_TABLET | Freq: Once | ORAL | Status: DC
Start: 2015-07-01 — End: 2015-07-01

## 2015-06-30 MED ORDER — METHYLPREDNISOLONE SODIUM SUCC 125 MG IJ SOLR
125.0000 mg | Freq: Once | INTRAMUSCULAR | Status: AC
  Administered 2015-06-30: 125 mg via INTRAVENOUS

## 2015-06-30 NOTE — ED Provider Notes (Signed)
CSN: [id number]     Arrival date & time 06/30/15  2326 History  By signing my name below, I, Bethel Born, attest that this documentation has been prepared under the direction and in the presence of Shon Baton, MD. Electronically Signed: Bethel Born, ED Scribe. 07/01/2015. 1:23 AM   Chief Complaint  Patient presents with  . Allergic Reaction    The history is provided by the patient and the EMS personnel. No language interpreter was used.   Brought in by EMS from home, Tanya Mayer is a 35 y.o. female with PMHx of asthma,  type II DM, HTN, HLD, stroke, seizures, schizophrenia, and bipolar disorder who presents to the Emergency Department complaining of an allergic reaction to eggs with onset tonight. Per EMS the pt called them for diffuse hives, shortness of breath, and tongue swelling. 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 Tonight she intentionally cooked and ate a scrambled egg sandwich because she was hungry and it was quick. She denies any intention of self harm. Pt was given a breathing treatment, 2 rounds of IM epinephrine, and IM Benadryl by EMS with mild improvement in symptoms.   Of note, patient has dysarthria secondary to stroke. Difficult to understand and writes for communication.  Past Medical History  Diagnosis Date  . Hypertension   . Heart murmur     "born w/one"  . Hyperlipemia   . Anginal pain (HCC)   . DVT (deep venous thrombosis) (HCC) after 2008    "BLE"  . Bronchial asthma   . Anemia   . GERD (gastroesophageal reflux disease)   . Migraine     "weekly" (03/19/2015)  . Chronic lower back pain   . Bipolar disorder (HCC)   . Schizophrenia (HCC)   . DM (diabetes mellitus) type II uncontrolled, periph vascular disorder (HCC)     notes 01/03/2015  . Seizures (HCC)     epilepsy/notes 02/27/2015  . Stroke (HCC) 04/2014; 09/2014(/notes 04/25/2015);  12/2014; 01/2015; 02/2015    "left side weakness; speech  problems since" (03/19/2015)  . Stroke (HCC) 03/19/2015    "left side weakness; speech problems" (03/19/2015)  . PFO (patent foramen ovale)     large, per TEE 03/01/2015   Past Surgical History  Procedure Laterality Date  . 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 Tee without cardioversion N/A 03/01/2015    Procedure: TRANSESOPHAGEAL ECHOCARDIOGRAM (TEE);  Surgeon: Thurmon Fair, MD;  Location: [facility] ENDOSCOPY;  Service: Cardiovascular;  Laterality: N/A;  . Hernia repair    . Umbilical hernia repair  "@ birth"  . Cesarean section  2008  . Tubal ligation  2008   Family History  Problem Relation Age of Onset  . Hypertension Mother   . Clotting disorder Mother     hypercoagulable disorder on mother side  . Heart attack Mother    Social History  Substance Use Topics  . Smoking status: Current Every Day Smoker -- 0.33 packs/day for 21 years    Types: Cigarettes  . Smokeless tobacco: Never Used  . Alcohol Use: 0.0 oz/week    0 Standard drinks or equivalent per week     Comment: 03/19/2015 "a couple times/month"   OB History    No data available     Review of Systems  Constitutional: Negative for fever.  HENT:       Tongue swelling  Respiratory: Positive for shortness of breath.   Cardiovascular: Negative for chest  pain.  Gastrointestinal: Negative for nausea, vomiting and abdominal pain.  Skin: Positive for rash.  Neurological: Positive for headaches.  All other systems reviewed and are negative.  Allergies  Carrot; Eggs or egg-derived products; and Mushroom extract complex  Home Medications   Prior to Admission medications   Medication Sig Start Date End Date Taking? Authorizing Provider  ibuprofen (ADVIL,MOTRIN) 800 MG tablet Take 800 mg by mouth every 8 (eight) hours as needed.   Yes Historical Provider, MD  acetaminophen (TYLENOL) 500 MG tablet Take 500 mg by mouth every 6 (six) hours as needed for moderate pain.    Historical Provider, MD  albuterol (PROVENTIL HFA;VENTOLIN HFA)  108 (90 BASE) MCG/ACT inhaler Inhale 2 puffs into the lungs every 6 (six) hours as needed for wheezing or shortness of breath.    Historical Provider, MD  aspirin EC 81 MG EC tablet Take 1 tablet (81 mg total) by mouth daily. 03/22/15   Kathee Delton, MD  atorvastatin (LIPITOR) 40 MG tablet Take 1 tablet (40 mg total) by mouth daily at 6 PM. 03/04/15   Asiyah Mayra Reel, MD  clopidogrel (PLAVIX) 75 MG tablet Take 1 tablet (75 mg total) by mouth daily. 01/18/15   Heather Laisure, PA-C  gabapentin (NEURONTIN) 300 MG capsule Take 3 capsules (900 mg total) by mouth 3 (three) times daily. 01/18/15   Heather Laisure, PA-C  hydrochlorothiazide (MICROZIDE) 12.5 MG capsule Take 12.5 mg by mouth daily.    Historical Provider, MD  hydrOXYzine (VISTARIL) 50 MG capsule Take 50 mg by mouth at bedtime.    Historical Provider, MD  oxycodone (ROXICODONE) 30 MG immediate release tablet Take 30 mg by mouth every 8 (eight) hours.    Historical Provider, MD  QUEtiapine (SEROQUEL) 300 MG tablet Take 1 tablet (300 mg total) by mouth at bedtime. 01/18/15   Heather Laisure, PA-C  risperiDONE (RISPERDAL) 0.25 MG tablet Take 1 tablet (0.25 mg total) by mouth 2 (two) times daily. 01/18/15   Heather Laisure, PA-C  trazodone (DESYREL) 300 MG tablet Take 1 tablet (300 mg total) by mouth at bedtime. 01/18/15   Heather Laisure, PA-C   BP 144/90 mmHg  Pulse 102  Temp(Src) 97.2 F (36.2 C)  Resp 14  Ht 5' (1.524 m)  Wt 169 lb (76.658 kg)  BMI 33.01 kg/m2  SpO2 100%  LMP 06/28/2015 (Exact Date) Physical Exam  Constitutional: No distress.  HENT:  Head: Normocephalic and atraumatic.  Mouth/Throat: Oropharynx is clear and moist.  Uvula visualized, no obvious oral swelling, patient cannot stick out her tongue  Eyes: Pupils are equal, round, and reactive to light.  Cardiovascular: Regular rhythm and normal heart sounds.   Tachycardia  Pulmonary/Chest: Breath sounds normal. No respiratory distress. She has no wheezes.  Tachypnea   Abdominal: Soft. Bowel sounds are normal. There is no tenderness. There is no rebound.  Neurological: She is alert.  Dysarthria noted  Skin: Skin is warm and dry.  Psychiatric: She has a normal mood and affect.  Nursing note and vitals reviewed.   ED Course  Procedures (including critical care time) DIAGNOSTIC STUDIES: Oxygen Saturation is 100% on RA,  normal by my interpretation.    COORDINATION OF CARE: 11:34 PM Discussed treatment plan which includes lab work, EKG, Ativan, and Epinepherine with pt at bedside and pt agreed to plan.  Labs Review Labs Reviewed  CBC WITH DIFFERENTIAL/PLATELET - Abnormal; Notable for the following:    Lymphs Abs 4.3 (*)    All other components within normal  limits  BASIC METABOLIC PANEL - Abnormal; Notable for the following:    Potassium 3.4 (*)    Glucose, Bld 108 (*)    Creatinine, Ser 1.47 (*)    GFR calc non Af Amer 45 (*)    GFR calc Af Amer 52 (*)    All other components within normal limits    Imaging Review No results found. I have personally reviewed and evaluated these lab results as part of my medical decision-making.   EKG Interpretation   Date/Time:  Monday July 01 2015 00:02:20 EST Ventricular Rate:  101 PR Interval:    QRS Duration: 83 QT Interval:  399 QTC Calculation: 517 R Axis:   87 Text Interpretation:  Sinus tachycardia Consider left ventricular  hypertrophy Prolonged QT interval Confirmed by Wilkie Aye  MD, COURTNEY  ([id number]) on 07/01/2015 1:03:58 AM      MDM   Final diagnoses:  Anaphylaxis, initial encounter    Patient presents with anaphylaxis. Known allergy to eggs. She received 2 doses of epinephrine and Benadryl en route. She is to But otherwise her vital signs are reassuring. No hypotension. Patient feels like she is continuing to have swelling in her throat. No objective stridor. However, additional IM epinephrine as well as Solu-Medrol and Pepcid were given.    Patient monitor closely without  recurrence of rash or anaphylactic symptoms. On recheck, she is more calm. Currently complaining of a headache.  Given multiple doses of IM epinephrine as well as need for intubation in the past for anaphylaxis, will admit for observation.  I personally performed the services described in this documentation, which was scribed in my presence. The recorded information has been reviewed and is accurate.    Shon Baton, MD 07/01/15 [id number]

## 2015-06-30 NOTE — ED Notes (Signed)
Pt arrives via EMS for allergic reaction, cooked herself some eggs and ate them p/t EMS arrival because "she was hungry." Pt reports she wasn't trying to hurt herself, was just hungry. EMS gave at epi pen at 2256 and 2318, 50 MG IM benadryl at 2258 and a duoneb. Hives/rash and shortness of breath resolved upon arrival. Some oral swelling, facial pain. Pt is conscious alert and oriented.

## 2015-07-01 ENCOUNTER — Encounter (HOSPITAL_COMMUNITY): Payer: Self-pay | Admitting: Internal Medicine

## 2015-07-01 DIAGNOSIS — I10 Essential (primary) hypertension: Secondary | ICD-10-CM

## 2015-07-01 DIAGNOSIS — T782XXA Anaphylactic shock, unspecified, initial encounter: Secondary | ICD-10-CM | POA: Diagnosis present

## 2015-07-01 LAB — COMPREHENSIVE METABOLIC PANEL
ALT: 17 U/L (ref 14–54)
AST: 22 U/L (ref 15–41)
Albumin: 3.5 g/dL (ref 3.5–5.0)
Alkaline Phosphatase: 93 U/L (ref 38–126)
Anion gap: 9 (ref 5–15)
BUN: 11 mg/dL (ref 6–20)
CALCIUM: 8.8 mg/dL — AB (ref 8.9–10.3)
CHLORIDE: 111 mmol/L (ref 101–111)
CO2: 20 mmol/L — ABNORMAL LOW (ref 22–32)
CREATININE: 1.32 mg/dL — AB (ref 0.44–1.00)
GFR, EST AFRICAN AMERICAN: 60 mL/min — AB (ref >60)
GFR, EST NON AFRICAN AMERICAN: 52 mL/min — AB (ref >60)
Glucose, Bld: 166 mg/dL — ABNORMAL HIGH (ref 65–99)
Potassium: 3.6 mmol/L (ref 3.5–5.1)
Sodium: 140 mmol/L (ref 135–145)
Total Bilirubin: 0.3 mg/dL (ref 0.3–1.2)
Total Protein: 6.8 g/dL (ref 6.5–8.1)

## 2015-07-01 LAB — BASIC METABOLIC PANEL
ANION GAP: 11 (ref 5–15)
BUN: 10 mg/dL (ref 6–20)
CHLORIDE: 106 mmol/L (ref 101–111)
CO2: 24 mmol/L (ref 22–32)
CREATININE: 1.47 mg/dL — AB (ref 0.44–1.00)
Calcium: 9.4 mg/dL (ref 8.9–10.3)
GFR calc non Af Amer: 45 mL/min — ABNORMAL LOW (ref >60)
GFR, EST AFRICAN AMERICAN: 52 mL/min — AB (ref >60)
Glucose, Bld: 108 mg/dL — ABNORMAL HIGH (ref 65–99)
Potassium: 3.4 mmol/L — ABNORMAL LOW (ref 3.5–5.1)
SODIUM: 141 mmol/L (ref 135–145)

## 2015-07-01 LAB — CBC WITH DIFFERENTIAL/PLATELET
BASOS ABS: 0 10*3/uL (ref 0.0–0.1)
BASOS PCT: 0 %
EOS ABS: 0.1 10*3/uL (ref 0.0–0.7)
Eosinophils Relative: 2 %
HCT: 39.9 % (ref 36.0–46.0)
HEMOGLOBIN: 13.4 g/dL (ref 12.0–15.0)
Lymphocytes Relative: 45 %
Lymphs Abs: 4.3 10*3/uL — ABNORMAL HIGH (ref 0.7–4.0)
MCH: 29.5 pg (ref 26.0–34.0)
MCHC: 33.6 g/dL (ref 30.0–36.0)
MCV: 87.9 fL (ref 78.0–100.0)
MONOS PCT: 10 %
Monocytes Absolute: 0.9 10*3/uL (ref 0.1–1.0)
NEUTROS ABS: 4.1 10*3/uL (ref 1.7–7.7)
NEUTROS PCT: 43 %
Platelets: 245 10*3/uL (ref 150–400)
RBC: 4.54 MIL/uL (ref 3.87–5.11)
RDW: 13 % (ref 11.5–15.5)
WBC: 9.5 10*3/uL (ref 4.0–10.5)

## 2015-07-01 LAB — CBC
HCT: 38.6 % (ref 36.0–46.0)
Hemoglobin: 12.8 g/dL (ref 12.0–15.0)
MCH: 29 pg (ref 26.0–34.0)
MCHC: 33.2 g/dL (ref 30.0–36.0)
MCV: 87.5 fL (ref 78.0–100.0)
Platelets: 227 10*3/uL (ref 150–400)
RBC: 4.41 MIL/uL (ref 3.87–5.11)
RDW: 12.9 % (ref 11.5–15.5)
WBC: 8.8 10*3/uL (ref 4.0–10.5)

## 2015-07-01 MED ORDER — PROCHLORPERAZINE EDISYLATE 5 MG/ML IJ SOLN
10.0000 mg | Freq: Four times a day (QID) | INTRAMUSCULAR | Status: DC | PRN
  Administered 2015-07-01: 10 mg via INTRAVENOUS
  Filled 2015-07-01: qty 2

## 2015-07-01 MED ORDER — OXYCODONE HCL 5 MG PO TABS
30.0000 mg | ORAL_TABLET | Freq: Three times a day (TID) | ORAL | Status: DC
  Administered 2015-07-01 (×3): 30 mg via ORAL
  Filled 2015-07-01 (×4): qty 6

## 2015-07-01 MED ORDER — DIPHENHYDRAMINE HCL 50 MG/ML IJ SOLN
25.0000 mg | Freq: Four times a day (QID) | INTRAMUSCULAR | Status: DC | PRN
  Administered 2015-07-01 – 2015-07-02 (×3): 25 mg via INTRAVENOUS
  Filled 2015-07-01 (×3): qty 1

## 2015-07-01 MED ORDER — CLOPIDOGREL BISULFATE 75 MG PO TABS
75.0000 mg | ORAL_TABLET | Freq: Every day | ORAL | Status: DC
  Administered 2015-07-01 – 2015-07-02 (×2): 75 mg via ORAL
  Filled 2015-07-01 (×2): qty 1

## 2015-07-01 MED ORDER — TRAZODONE HCL 100 MG PO TABS
300.0000 mg | ORAL_TABLET | Freq: Every day | ORAL | Status: DC
  Administered 2015-07-01: 300 mg via ORAL
  Filled 2015-07-01: qty 3

## 2015-07-01 MED ORDER — GABAPENTIN 300 MG PO CAPS
900.0000 mg | ORAL_CAPSULE | Freq: Three times a day (TID) | ORAL | Status: DC
  Administered 2015-07-01 – 2015-07-02 (×3): 900 mg via ORAL
  Filled 2015-07-01 (×3): qty 3

## 2015-07-01 MED ORDER — QUETIAPINE FUMARATE 300 MG PO TABS
300.0000 mg | ORAL_TABLET | Freq: Every day | ORAL | Status: DC
  Administered 2015-07-01: 300 mg via ORAL
  Filled 2015-07-01: qty 1

## 2015-07-01 MED ORDER — ALBUTEROL SULFATE (2.5 MG/3ML) 0.083% IN NEBU: 2.5000 mg | INHALATION_SOLUTION | Freq: Four times a day (QID) | RESPIRATORY_TRACT | Status: DC | PRN

## 2015-07-01 MED ORDER — FAMOTIDINE IN NACL 20-0.9 MG/50ML-% IV SOLN
20.0000 mg | Freq: Two times a day (BID) | INTRAVENOUS | Status: DC
  Administered 2015-07-01 (×2): 20 mg via INTRAVENOUS
  Filled 2015-07-01 (×4): qty 50

## 2015-07-01 MED ORDER — DIPHENHYDRAMINE HCL 50 MG/ML IJ SOLN
25.0000 mg | Freq: Once | INTRAMUSCULAR | Status: AC
  Administered 2015-07-01: 25 mg via INTRAVENOUS
  Filled 2015-07-01: qty 1

## 2015-07-01 MED ORDER — ATORVASTATIN CALCIUM 40 MG PO TABS
40.0000 mg | ORAL_TABLET | Freq: Every day | ORAL | Status: DC
  Administered 2015-07-01: 40 mg via ORAL
  Filled 2015-07-01: qty 4

## 2015-07-01 MED ORDER — METHYLPREDNISOLONE SODIUM SUCC 40 MG IJ SOLR
40.0000 mg | Freq: Every day | INTRAMUSCULAR | Status: DC
  Administered 2015-07-01 – 2015-07-02 (×2): 40 mg via INTRAVENOUS
  Filled 2015-07-01 (×2): qty 1

## 2015-07-01 MED ORDER — SODIUM CHLORIDE 0.9 % IV BOLUS (SEPSIS)
1000.0000 mL | Freq: Once | INTRAVENOUS | Status: AC
  Administered 2015-07-01: 1000 mL via INTRAVENOUS

## 2015-07-01 MED ORDER — ASPIRIN EC 81 MG PO TBEC
81.0000 mg | DELAYED_RELEASE_TABLET | Freq: Every day | ORAL | Status: DC
  Administered 2015-07-01 – 2015-07-02 (×2): 81 mg via ORAL
  Filled 2015-07-01 (×2): qty 1

## 2015-07-01 MED ORDER — OXYCODONE HCL 5 MG PO TABS
30.0000 mg | ORAL_TABLET | Freq: Three times a day (TID) | ORAL | Status: DC | PRN
  Administered 2015-07-02: 30 mg via ORAL
  Filled 2015-07-01: qty 6

## 2015-07-01 MED ORDER — RISPERIDONE 0.25 MG PO TABS
0.2500 mg | ORAL_TABLET | Freq: Two times a day (BID) | ORAL | Status: DC
  Administered 2015-07-01 – 2015-07-02 (×3): 0.25 mg via ORAL
  Filled 2015-07-01 (×3): qty 1

## 2015-07-01 MED ORDER — ALBUTEROL SULFATE HFA 108 (90 BASE) MCG/ACT IN AERS: 2.0000 | INHALATION_SPRAY | Freq: Four times a day (QID) | RESPIRATORY_TRACT | Status: DC | PRN

## 2015-07-01 MED ORDER — HYDROXYZINE PAMOATE 50 MG PO CAPS
50.0000 mg | ORAL_CAPSULE | Freq: Every day | ORAL | Status: DC
  Filled 2015-07-01: qty 1

## 2015-07-01 MED ORDER — PNEUMOCOCCAL VAC POLYVALENT 25 MCG/0.5ML IJ INJ
0.5000 mL | INJECTION | INTRAMUSCULAR | Status: DC
  Filled 2015-07-01: qty 0.5

## 2015-07-01 MED ORDER — HYDROXYZINE HCL 25 MG PO TABS
50.0000 mg | ORAL_TABLET | Freq: Every day | ORAL | Status: DC
  Filled 2015-07-01: qty 2

## 2015-07-01 MED ORDER — SODIUM CHLORIDE 0.9 % IV SOLN
INTRAVENOUS | Status: DC
  Administered 2015-07-01: 04:00:00 via INTRAVENOUS

## 2015-07-01 NOTE — Progress Notes (Signed)
[facility]                                                                              Patient Demographics  Tanya Mayer, is a 35 y.o. female, DOB - May 24, 1980, ZOX:[id number]  Admit date - 06/30/2015   Admitting Physician No admitting provider for patient encounter.  Outpatient Primary MD for the patient is No PCP Per Patient  LOS -    Chief Complaint  Patient presents with  . Allergic Reaction       Brief HPI   Tanya Mayer is a 35 y.o. female history of CVA with left-sided hemiplegia, hypertension, PFO was brought to the ER after patient had started developing sudden onset of difficulty breathing. Patient has had an egg last during 10 minutes following which patient started developing difficulty breathing and speaking. 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 EMS was called and patient was given 3 doses of epinephrine. During the episode patient also had itching rash which improved after epinephrine injection. He is still has some difficulty speaking. On exam patient has no wheezing or stridor. Patient will be admitted for further observation. Denies taking any other new medications.    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 - Patient reports prior known allergy to eggs or eggs derivatives, however she still ate eggs because she was hungry - Continue current management with IV Solu-Medrol, Pepcid, Benadryl as needed  Active Problems:   Essential hypertension - Currently stable, hold off on HCTZ, continue gentle hydration  CK D stage II - Continue gentle hydration  Prior history of CVA with left-sided weakness - Currently at baseline, continue Plavix statin, aspirin   Schizophrenia, bipolar disorder - Continue Seroquel and risperidone   Code Status: full code   Family Communication: Discussed in detail with the patient, all imaging results, lab results explained to the patient and  fianc   Disposition Plan: Likely DC name is improving  Time Spent in minutes   23 minutes  Procedures  None  Consults   None  DVT Prophylaxis   Medications  Scheduled Meds: . aspirin EC  81 mg Oral Daily  . atorvastatin  40 mg Oral q1800  . clopidogrel  75 mg Oral Daily  . gabapentin  900 mg Oral TID  . hydrOXYzine  50 mg Oral QHS  . methylPREDNISolone (SOLU-MEDROL) injection  40 mg Intravenous Daily  . oxycodone  30 mg Oral 3 times per day  . QUEtiapine  300 mg Oral QHS  . risperiDONE  0.25 mg Oral BID  . trazodone  300 mg Oral QHS   Continuous Infusions: . sodium chloride 75 mL/hr at 07/01/15 0342  . famotidine (PEPCID) IV 20 mg (07/01/15 0931)   PRN Meds:.albuterol, diphenhydrAMINE   Antibiotics   Anti-infectives    None        Subjective:   Tanya Mayer was seen and examined today.  States she still can't speak well although breathing well, O2 sats 100%. Patient denies dizziness, chest pain, shortness of breath, abdominal pain, N/V/D/C, numbess, tingling. No acute events overnight.    Objective:  Filed Vitals:   07/01/15 1029 07/01/15 1030 07/01/15 1108 07/01/15 1115  BP: 135/98 133/92 133/98 128/94  Pulse: 81 80 76 86  Temp:      Resp: Height:      Weight:      SpO2: 100% 100% 100% 100%    Intake/Output Summary (Last 24 hours) at 07/01/15 1156 Last data filed at 07/01/15 0335  Gross per 24 hour  Intake   1000 ml  Output      0 ml  Net   1000 ml     Wt Readings from Last 3 Encounters:  06/30/15 76.658 kg (169 lb)  03/22/15 75.63 kg (166 lb 11.7 oz)  02/27/15 71.215 kg (157 lb)     Exam  General: Alert and oriented x 3, NAD  HEENT:  PERRLA, EOMI, Anicteric Sclera, mucous membranes moist.   Neck: Supple, no JVD, no masses  CVS: S1 S2 auscultated, no rubs, murmurs or gallops. Regular rate and rhythm.  Respiratory: Clear to auscultation bilaterally, no wheezing, rales or rhonchi  Abdomen: Soft, nontender,  nondistended, + bowel sounds  Ext: no cyanosis clubbing or edema  Neuro: AAOx3, Cr N's II- XII. Strength 5/5 upper and lower extremities bilaterally  Skin: No rashes  Psych: Normal affect and demeanor, alert and oriented x3    Data Review   Micro Results No results found for this or any previous visit (from the past 240 hour(s)).  Radiology Reports No results found.  CBC  Recent Labs Lab 07/01/15 0001 07/01/15 0407  WBC 9.5 8.8  HGB 13.4 12.8  HCT 39.9 38.6  PLT 245 227  MCV 87.9 87.5  MCH 29.5 29.0  MCHC 33.6 33.2  RDW 13.0 12.9  LYMPHSABS 4.3*  --   MONOABS 0.9  --   EOSABS 0.1  --   BASOSABS 0.0  --     Chemistries   Recent Labs Lab 07/01/15 0001 07/01/15 0407  NA 141 140  K 3.4* 3.6  CL 106 111  CO2 24 20*  GLUCOSE 108* 166*  BUN 10 11  CREATININE 1.47* 1.32*  CALCIUM 9.4 8.8*  AST  --  22  ALT  --  17  ALKPHOS  --  93  BILITOT  --  0.3   ------------------------------------------------------------------------------------------------------------------ estimated creatinine clearance is 54.5 mL/min (by C-G formula based on Cr of 1.32). ------------------------------------------------------------------------------------------------------------------ No results for input(s): HGBA1C in the last 72 hours. ------------------------------------------------------------------------------------------------------------------ No results for input(s): CHOL, HDL, LDLCALC, TRIG, CHOLHDL, LDLDIRECT in the last 72 hours. ------------------------------------------------------------------------------------------------------------------ No results for input(s): TSH, T4TOTAL, T3FREE, THYROIDAB in the last 72 hours.  Invalid input(s): FREET3 ------------------------------------------------------------------------------------------------------------------ No results for input(s): VITAMINB12, FOLATE, FERRITIN, TIBC, IRON, RETICCTPCT in the last 72 hours.  Coagulation  profile No results for input(s): INR, PROTIME in the last 168 hours.  No results for input(s): DDIMER in the last 72 hours.  Cardiac Enzymes No results for input(s): CKMB, TROPONINI, MYOGLOBIN in the last 168 hours.  Invalid input(s): CK ------------------------------------------------------------------------------------------------------------------ Invalid input(s): POCBNP  No results for input(s): GLUCAP in the last 72 hours.   RAI,RIPUDEEP M.D. [facility] 07/01/2015, 11:56 AM  Pager: [phone] Between 7am to 7pm - call Pager - [phone]  After 7pm go to [email or web address] - password [email or web address]  Call night coverage person covering after 7pm

## 2015-07-01 NOTE — ED Notes (Addendum)
Pt states she is itching. Pt has not been scratching herself while RN is in room for the past 20 min. RN will give PRN benedryl. Pt also stating she feels her speech is slurred- pt will go in a out of strange sounding version of "slurred speech." Pt will talk clearly then add a lisp at times. Pt has no facial droop. Pt not wanting to annunciate words. Pts significant other at bedside states pt usually talks clearly. Pt passes swallow screen.

## 2015-07-01 NOTE — ED Notes (Signed)
Ambulated pt to restroom  

## 2015-07-01 NOTE — ED Notes (Signed)
patient placement spoke with admitting and states they will change patients level of care.

## 2015-07-01 NOTE — ED Notes (Signed)
Patient and family member was given a cup of ice water.

## 2015-07-01 NOTE — ED Notes (Signed)
Ordered meal tray for pt per standing order to progress diet if pt passes swallow screen

## 2015-07-01 NOTE — ED Notes (Signed)
Admitting paged to verify pt needs a stepdown bed.

## 2015-07-01 NOTE — ED Notes (Signed)
Called to room and pt states she is hot. 2 of three blankets removed and pt appears to be more comfortable.

## 2015-07-01 NOTE — ED Notes (Signed)
Admitting repaged. 

## 2015-07-01 NOTE — H&P (Addendum)
[facility] History and Physical  Jakeisha Stricker MVH:[id number] DOB: [birth year] DOA: 06/30/2015  Referring physician: Dr.Horton. PCP: No PCP Per Patient  Specialists: None.  Chief Complaint: Difficulty breathing. Rash.  HPI: Genelle Economou is a 35 y.o. female history of CVA with left-sided hemiplegia, hypertension, PFO was brought to the ER after patient had started developing sudden onset of difficulty breathing. Patient has had an egg last during 10 minutes following which patient started developing difficulty breathing and speaking. 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 EMS was called and patient was given 3 doses of epinephrine. During the episode patient also had itching rash which improved after epinephrine injection. He is still has some difficulty speaking. On exam patient has no wheezing or stridor. Patient will be admitted for further observation. Denies taking any other new medications.   Review of Systems: As presented in the history of presenting illness, rest negative.  Past Medical History  Diagnosis Date  . Hypertension   . Heart murmur     "born w/one"  . Hyperlipemia   . Anginal pain (HCC)   . DVT (deep venous thrombosis) (HCC) after 2008    "BLE"  . Bronchial asthma   . Anemia   . GERD (gastroesophageal reflux disease)   . Migraine     "weekly" (03/19/2015)  . Chronic lower back pain   . Bipolar disorder (HCC)   . Schizophrenia (HCC)   . DM (diabetes mellitus) type II uncontrolled, periph vascular disorder (HCC)     notes 01/03/2015  . Seizures (HCC)     epilepsy/notes 02/27/2015  . Stroke (HCC) 04/2014; 09/2014(/notes 04/25/2015);  12/2014; 01/2015; 02/2015    "left side weakness; speech problems since" (03/19/2015)  . Stroke (HCC) 03/19/2015    "left side weakness; speech problems" (03/19/2015)  . PFO (patent foramen ovale)     large, per TEE 03/01/2015   Past Surgical History  Procedure Laterality Date  . 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 Tee  without cardioversion N/A 03/01/2015    Procedure: TRANSESOPHAGEAL ECHOCARDIOGRAM (TEE);  Surgeon: Thurmon Fair, MD;  Location: [facility] ENDOSCOPY;  Service: Cardiovascular;  Laterality: N/A;  . Hernia repair    . Umbilical hernia repair  "@ birth"  . Cesarean section  2008  . Tubal ligation  2008   Social History:  reports that she has been smoking Cigarettes.  She has a 6.93 pack-year smoking history. She has never used smokeless tobacco. She reports that she drinks alcohol. She reports that she uses illicit drugs (Marijuana). Where does patient live home. Can patient participate in ADLs? Yes.  Allergies  Allergen Reactions  . Carrot [Daucus Carota] Anaphylaxis  . Eggs Or Egg-Derived Products Anaphylaxis and Swelling  . Mushroom Extract Complex Anaphylaxis    Family History:  Family History  Problem Relation Age of Onset  . Hypertension Mother   . Clotting disorder Mother     hypercoagulable disorder on mother side  . Heart attack Mother       Prior to Admission medications   Medication Sig Start Date End Date Taking? Authorizing Provider  ibuprofen (ADVIL,MOTRIN) 800 MG tablet Take 800 mg by mouth every 8 (eight) hours as needed.   Yes Historical Provider, MD  acetaminophen (TYLENOL) 500 MG tablet Take 500 mg by mouth every 6 (six) hours as needed for moderate pain.    Historical Provider, MD  albuterol (PROVENTIL HFA;VENTOLIN HFA) 108 (90 BASE) MCG/ACT inhaler Inhale 2 puffs into the lungs every  6 (six) hours as needed for wheezing or shortness of breath.    Historical Provider, MD  aspirin EC 81 MG EC tablet Take 1 tablet (81 mg total) by mouth daily. 03/22/15   Kathee Delton, MD  atorvastatin (LIPITOR) 40 MG tablet Take 1 tablet (40 mg total) by mouth daily at 6 PM. 03/04/15   Asiyah Mayra Reel, MD  clopidogrel (PLAVIX) 75 MG tablet Take 1 tablet (75 mg total) by mouth daily. 01/18/15   Heather Laisure, PA-C  gabapentin (NEURONTIN) 300 MG capsule Take 3 capsules (900 mg total) by  mouth 3 (three) times daily. 01/18/15   Heather Laisure, PA-C  hydrochlorothiazide (MICROZIDE) 12.5 MG capsule Take 12.5 mg by mouth daily.    Historical Provider, MD  hydrOXYzine (VISTARIL) 50 MG capsule Take 50 mg by mouth at bedtime.    Historical Provider, MD  oxycodone (ROXICODONE) 30 MG immediate release tablet Take 30 mg by mouth every 8 (eight) hours.    Historical Provider, MD  QUEtiapine (SEROQUEL) 300 MG tablet Take 1 tablet (300 mg total) by mouth at bedtime. 01/18/15   Heather Laisure, PA-C  risperiDONE (RISPERDAL) 0.25 MG tablet Take 1 tablet (0.25 mg total) by mouth 2 (two) times daily. 01/18/15   Heather Laisure, PA-C  trazodone (DESYREL) 300 MG tablet Take 1 tablet (300 mg total) by mouth at bedtime. 01/18/15   Santiago Glad, PA-C    Physical Exam: Filed Vitals:   07/01/15 0100 07/01/15 0119 07/01/15 0130 07/01/15 0212  BP:  144/90  134/88  Pulse: 92 102 100 93  Temp:      Resp: Height:      Weight:      SpO2: 100% 100% 100% 100%     General:  Moderately built and nourished.  Eyes: Anicteric no pallor.  ENT: No discharge from the ears eyes nose and mouth.  Neck: No mass felt. No neck rigidity.  Cardiovascular: S1-S2 heard.  Respiratory: No rhonchi or crepitations.  Abdomen: Soft nontender bowel sounds present.  Skin: No rash.  Musculoskeletal: No edema.  Psychiatric: Mildly drowsy.  Neurologic: Mildly drowsy but arouses questions appropriately. Left-sided weakness from previous stroke.  Labs on Admission:  Basic Metabolic Panel:  Recent Labs Lab 07/01/15 0001  NA 141  K 3.4*  CL 106  CO2 24  GLUCOSE 108*  BUN 10  CREATININE 1.47*  CALCIUM 9.4   Liver Function Tests: No results for input(s): AST, ALT, ALKPHOS, BILITOT, PROT, ALBUMIN in the last 168 hours. No results for input(s): LIPASE, AMYLASE in the last 168 hours. No results for input(s): AMMONIA in the last 168 hours. CBC:  Recent Labs Lab 07/01/15 0001  WBC 9.5   NEUTROABS 4.1  HGB 13.4  HCT 39.9  MCV 87.9  PLT 245   Cardiac Enzymes: No results for input(s): CKTOTAL, CKMB, CKMBINDEX, TROPONINI in the last 168 hours.  BNP (last 3 results) No results for input(s): BNP in the last 8760 hours.  ProBNP (last 3 results) No results for input(s): PROBNP in the last 8760 hours.  CBG: No results for input(s): GLUCAP in the last 168 hours.  Radiological Exams on Admission: No results found.  Assessment/Plan Principal Problem:   Anaphylaxis Active Problems:   Essential hypertension   1. 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 I have placed patient on Solu-Medrol Pepcid and when necessary Benadryl. On his more alert and awake will need advice about not taking eggs. 2. Hypertension -  will hold off hydrochlorothiazide for now and treat patient on when necessary IV hydralazine. Patient is receiving gentle hydration. 3. Chronic kidney disease stage 2 with mild worsening - gently hydrated and check creatinine. 4. History of CVA with left-sided weakness - patient still has some difficulty talking but has no stridor. Closely observe. Patient is on Solu-Medrol. Continue Plavix statins and aspirin. 5. Schizophrenia and bipolar disorder will continue Seroquel and Risperdal along with trazodone. 6. History of PFO.   DVT Prophylaxis SCDs.  Code Status: Full code.  Family Communication: Patient's husband.  Disposition Plan: Admit for observation.    Mackenzie Lia N. [facility] Pager [phone].  If 7PM-7AM, please contact night-coverage [email or web address] Password [email or web address] 07/01/2015, 2:36 AM

## 2015-07-02 DIAGNOSIS — I10 Essential (primary) hypertension: Secondary | ICD-10-CM | POA: Diagnosis not present

## 2015-07-02 LAB — CBC
HEMATOCRIT: 34.8 % — AB (ref 36.0–46.0)
HEMOGLOBIN: 11.1 g/dL — AB (ref 12.0–15.0)
MCH: 28.4 pg (ref 26.0–34.0)
MCHC: 31.9 g/dL (ref 30.0–36.0)
MCV: 89 fL (ref 78.0–100.0)
Platelets: 197 10*3/uL (ref 150–400)
RBC: 3.91 MIL/uL (ref 3.87–5.11)
RDW: 13.5 % (ref 11.5–15.5)
WBC: 9.3 10*3/uL (ref 4.0–10.5)

## 2015-07-02 LAB — BASIC METABOLIC PANEL
ANION GAP: 7 (ref 5–15)
BUN: 8 mg/dL (ref 6–20)
CALCIUM: 8.9 mg/dL (ref 8.9–10.3)
CHLORIDE: 109 mmol/L (ref 101–111)
CO2: 22 mmol/L (ref 22–32)
CREATININE: 1.28 mg/dL — AB (ref 0.44–1.00)
GFR calc non Af Amer: 53 mL/min — ABNORMAL LOW (ref >60)
Glucose, Bld: 113 mg/dL — ABNORMAL HIGH (ref 65–99)
Potassium: 4 mmol/L (ref 3.5–5.1)
SODIUM: 138 mmol/L (ref 135–145)

## 2015-07-02 MED ORDER — ALPRAZOLAM 1 MG PO TABS: 1.0000 mg | ORAL_TABLET | Freq: Two times a day (BID) | ORAL | Status: DC | PRN

## 2015-07-02 MED ORDER — TRAZODONE HCL 300 MG PO TABS: 300.0000 mg | ORAL_TABLET | Freq: Every day | ORAL | Status: DC

## 2015-07-02 MED ORDER — ALBUTEROL SULFATE HFA 108 (90 BASE) MCG/ACT IN AERS: 2.0000 | INHALATION_SPRAY | Freq: Four times a day (QID) | RESPIRATORY_TRACT | Status: DC | PRN

## 2015-07-02 MED ORDER — FAMOTIDINE 40 MG/5ML PO SUSR
20.0000 mg | Freq: Two times a day (BID) | ORAL | Status: DC
  Administered 2015-07-02: 20 mg via ORAL
  Filled 2015-07-02: qty 2.5

## 2015-07-02 MED ORDER — ATORVASTATIN CALCIUM 40 MG PO TABS: 40.0000 mg | ORAL_TABLET | Freq: Every day | ORAL | Status: DC

## 2015-07-02 MED ORDER — INSULIN DETEMIR 100 UNIT/ML ~~LOC~~ SOLN: 10.0000 [IU] | Freq: Every day | SUBCUTANEOUS | Status: DC

## 2015-07-02 MED ORDER — CLOPIDOGREL BISULFATE 75 MG PO TABS: 75.0000 mg | ORAL_TABLET | Freq: Every day | ORAL | Status: DC

## 2015-07-02 MED ORDER — FAMOTIDINE 20 MG PO TABS: 20.0000 mg | ORAL_TABLET | Freq: Two times a day (BID) | ORAL | Status: DC

## 2015-07-02 MED ORDER — QUETIAPINE FUMARATE 300 MG PO TABS
300.0000 mg | ORAL_TABLET | Freq: Every day | ORAL | Status: DC
Start: 2015-07-02 — End: 2017-11-25

## 2015-07-02 MED ORDER — RISPERIDONE 0.25 MG PO TABS: 0.2500 mg | ORAL_TABLET | Freq: Two times a day (BID) | ORAL | Status: DC

## 2015-07-02 MED ORDER — PREDNISONE 10 MG (21) PO TBPK: 10.0000 mg | ORAL_TABLET | Freq: Every day | ORAL | Status: DC

## 2015-07-02 MED ORDER — OXYCODONE HCL 30 MG PO TABS: 30.0000 mg | ORAL_TABLET | Freq: Three times a day (TID) | ORAL | Status: DC

## 2015-07-02 MED ORDER — HYDROXYZINE PAMOATE 50 MG PO CAPS: 50.0000 mg | ORAL_CAPSULE | Freq: Every day | ORAL | Status: DC

## 2015-07-02 MED ORDER — PREDNISONE 10 MG (21) PO TBPK: ORAL_TABLET | ORAL | Status: DC

## 2015-07-02 MED ORDER — GABAPENTIN 300 MG PO CAPS: 900.0000 mg | ORAL_CAPSULE | Freq: Three times a day (TID) | ORAL | Status: DC

## 2015-07-02 NOTE — Progress Notes (Addendum)
CM received consult:Needs PCP or community wellness ctr appointment. CM arranged f/u appointment with the [facility] on 07/10/15 at 5:00pm to aid in establishing PCP. CM  made pt aware and provided [facility] brochure to pt/husband Gae Gallop RN,BSN,CM [phone]

## 2015-07-02 NOTE — Progress Notes (Signed)
CSW consulted regarding transportation needs at discharge. Patient reported that she is unable to ride the bus due to her anxiety and that her boyfriend rode in the ambulance with her to the hospital and doesn't have transportation.   CSW provided taxi voucher and transportation resources as well as info for [employer] of the [place]..  CSW signing off.  Osborne Casco Cristino Degroff LCSWA [phone]

## 2015-07-02 NOTE — Discharge Summary (Signed)
Physician Discharge Summary   Patient ID: Tanya Mayer MRN: [id number] DOB/AGE: Aug 06, 1980 35 y.o.  Admit date: 06/30/2015 Discharge date: 07/02/2015  Primary Care Physician:  No PCP Per Patient  Discharge Diagnoses:    . 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 Patient has known allergy to eggs or eggs derivatives and was explained again to not eat eggs 2. Please repeat CBC/BMET at next visit  3. Patient needs to have outpatient referral to psychiatry   DIET: Regular diet  Allergies:   Allergies  Allergen Reactions  . Carrot [Daucus Carota] Anaphylaxis  . Eggs Or Egg-Derived Products Anaphylaxis and Swelling  . Mushroom Extract Complex Anaphylaxis     DISCHARGE MEDICATIONS: Current Discharge Medication List    START taking these medications   Details  famotidine (PEPCID) 20 MG tablet Take 1 tablet (20 mg total) by mouth 2 (two) times daily. Qty: 14 tablet, Refills: 0    predniSONE (STERAPRED UNI-PAK 21 TAB) 10 MG (21) TBPK tablet Take as directed on the pack Qty: 21 tablet, Refills: 0      CONTINUE these medications which have CHANGED   Details  albuterol (PROVENTIL HFA;VENTOLIN HFA) 108 (90 Base) MCG/ACT inhaler Inhale 2 puffs into the lungs every 6 (six) hours as needed for wheezing or shortness of breath. Qty: 1 Inhaler, Refills: 2    ALPRAZolam (XANAX) 1 MG tablet Take 1 tablet (1 mg total) by mouth 2 (two) times daily as needed for anxiety. Qty: 20 tablet, Refills: 0    atorvastatin (LIPITOR) 40 MG tablet Take 1 tablet (40 mg total) by mouth daily at 6 PM. Qty: 30 tablet, Refills: 3    clopidogrel (PLAVIX) 75 MG tablet Take 1 tablet (75 mg total) by mouth daily. Qty: 30 tablet, Refills: 3    gabapentin (NEURONTIN) 300 MG capsule Take 3 capsules  (900 mg total) by mouth 3 (three) times daily. Qty: 270 capsule, Refills: 1    hydrOXYzine (VISTARIL) 50 MG capsule Take 1 capsule (50 mg total) by mouth at bedtime. Qty: 30 capsule, Refills: 0    insulin detemir (LEVEMIR) 100 UNIT/ML injection Inject 0.1 mLs (10 Units total) into the skin at bedtime. Qty: 10 mL, Refills: 5    oxycodone (ROXICODONE) 30 MG immediate release tablet Take 1 tablet (30 mg total) by mouth every 8 (eight) hours. Qty: 10 tablet, Refills: 0    QUEtiapine (SEROQUEL) 300 MG tablet Take 1 tablet (300 mg total) by mouth at bedtime. Qty: 10 tablet, Refills: 0    risperiDONE (RISPERDAL) 0.25 MG tablet Take 1 tablet (0.25 mg total) by mouth 2 (two) times daily. Qty: 20 tablet, Refills: 0    trazodone (DESYREL) 300 MG tablet Take 1 tablet (300 mg total) by mouth at bedtime. Qty: 10 tablet, Refills: 0      CONTINUE these medications which have NOT CHANGED   Details  acetaminophen (TYLENOL) 500 MG tablet Take 500 mg by mouth every 6 (six) hours as needed for moderate pain.    aspirin EC 81 MG EC tablet Take 1 tablet (81 mg total) by mouth daily. Qty: 30 tablet, Refills: 0    diphenhydramine-acetaminophen (TYLENOL PM) 25-500 MG TABS tablet Take 1 tablet by mouth at bedtime as needed (sleep).    ibuprofen (ADVIL,MOTRIN) 800 MG tablet Take 800 mg by mouth every 8 (eight) hours as needed for  moderate pain.       STOP taking these medications     hydrochlorothiazide (MICROZIDE) 12.5 MG capsule          Brief H and P: For complete details please refer to admission H and P, but in brief Tanya Mayer is a 35 y.o. female history of CVA with left-sided hemiplegia, hypertension, PFO was brought to the ER after patient had started developing sudden onset of difficulty breathing. Patient has had an egg last during 10 minutes following which patient started developing difficulty breathing and speaking. 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 EMS was called and  patient was given 3 doses of epinephrine. During the episode patient also had itching rash which improved after epinephrine injection. On exam patient had no wheezing or stridor. Patient will be admitted for further observation. Denies taking any other new medications.   Hospital Course:  Anaphylaxis reaction to eggs - Patient reports prior known allergy to eggs or eggs derivatives, however she still ate eggs because she was hungry - Patient was placed on IV Solu-Medrol, Pepcid, Benadryl. Her symptoms have improved, she is tolerating diet without any difficulty, O2 sats 97% on room air. Patient will be discharged on Predpack steroids with Pepcid    Essential hypertension - Currently stable, hold off on HCTZ  CK D stage II Patient was placed on gentle hydration  Patient was placed on gentle hydration left-sided weakness - Currently at baseline, continue Plavix statin, aspirin   Schizophrenia, bipolar disorder - Continue Seroquel and risperidone. Patient is severely noncompliant, asking for her psychiatric medications   refills. I have given her prescription for 10 days, recommended strongly to follow-up with PCP. [facility].   Diabetes mellitus Patient's blood sugars have been better while inpatient, hence outpatient Levemir decreased to 10 units at bedtime    Day of Discharge BP 110/64 mmHg  Pulse 71  Temp(Src) 98 F (36.7 C) (Oral)  Resp 18  Ht 5' (1.524 m)  Wt 76.658 kg (169 lb)  BMI 33.01 kg/m2  SpO2 97%  LMP 06/28/2015 (Exact Date)  Physical Exam: General: Alert and awake oriented x3 not in any acute distress. HEENT: anicteric sclera, pupils reactive to light and accommodation CVS: S1-S2 clear no murmur rubs or gallops Chest: clear to auscultation bilaterally, no wheezing rales or rhonchi Abdomen: soft nontender, nondistended, normal bowel sounds Extremities: no cyanosis, clubbing or edema  noted bilaterally Neuro: Cranial nerves II-XII intact, no focal neurological deficits   The results of significant diagnostics from this hospitalization (including imaging, microbiology, ancillary and laboratory) are listed below for reference.    LAB RESULTS: Basic Metabolic Panel:  Recent Labs Lab 07/01/15 0407 07/02/15 0622  NA 140 138  K 3.6 4.0  CL 111 109  CO2 20* 22  GLUCOSE 166* 113*  BUN 11 8  CREATININE 1.32* 1.28*  CALCIUM 8.8* 8.9   Liver Function Tests:  Recent Labs Lab 07/01/15 0407  AST 22  ALT 17  ALKPHOS 93  BILITOT 0.3  PROT 6.8  ALBUMIN 3.5   No results for input(s): LIPASE, AMYLASE in the last 168 hours. No results for input(s): AMMONIA in the last 168 hours. CBC:  Recent Labs Lab 07/01/15 0001 07/01/15 0407 07/02/15 0622  WBC 9.5 8.8 9.3  NEUTROABS 4.1  --   --   HGB 13.4 12.8 11.1*  HCT 39.9 38.6 34.8*  MCV 87.9 87.5 89.0  PLT 245 227 197  Cardiac Enzymes: No results for input(s): CKTOTAL, CKMB, CKMBINDEX, TROPONINI in the last 168 hours. BNP: Invalid input(s): POCBNP CBG: No results for input(s): GLUCAP in the last 168 hours.  Significant Diagnostic Studies:  No results found.  2D ECHO:   Disposition and Follow-up: Discharge Instructions    Diet Carb Modified    Complete by:  As directed      Discharge instructions    Complete by:  As directed   It is VERY IMPORTANT that you follow up with a PCP on a regular basis.  Please follow up asap for refills on your medications.     Increase activity slowly    Complete by:  As directed             DISPOSITION: home   DISCHARGE FOLLOW-UP Follow-up Information    Follow up with [facility] On 07/10/2015.   Why:  Follow up appointment arranged for 07/10/15 at 5:00 pm   Contact information:   [address] [place] [address] [phone]       Time spent on Discharge:   Signed:   RAI,RIPUDEEP M.D. [facility] 07/02/2015, 11:09 AM Pager: [phone]

## 2015-07-02 NOTE — Evaluation (Signed)
Physical Therapy Evaluation Patient Details Name: Tanya Mayer MRN: [id number] DOB: 1981-03-30 Today's Date: 07/02/2015   History of Present Illness  Pt adm with anaphalactic reaction to eggs. PMH - DM, CVA x3 , depression, bipolar, schizophrenia, HTN  Clinical Impression  Pt known to me from previous admission. Pt able to move adequately to be able to return home. Pt appears to be self limiting as she was last admission.    Follow Up Recommendations No PT follow up    Equipment Recommendations  None recommended by PT    Recommendations for Other Services       Precautions / Restrictions Precautions Precautions: None Restrictions Weight Bearing Restrictions: No      Mobility  Bed Mobility Overal bed mobility: Modified Independent             General bed mobility comments: Incr time  Transfers Overall transfer level: Modified independent               General transfer comment: Incr time  Ambulation/Gait Ambulation/Gait assistance: Modified independent (Device/Increase time) Ambulation Distance (Feet): 225 Feet Assistive device: None;4-wheeled walker Gait Pattern/deviations: Step-through pattern;Decreased step length - right;Decreased step length - left Gait velocity: decr Gait velocity interpretation: Below normal speed for age/gender General Gait Details: Pt with slow, deliberate gait. Able to walk with or without rollator.  Stairs Stairs: Yes Stairs assistance: Min guard Stair Management: One rail Left;Step to pattern;Forwards Number of Stairs: 2 General stair comments: Used counter as simulated rail.  Wheelchair Mobility    Modified Rankin (Stroke Patients Only)       Balance Overall balance assessment: Needs assistance Sitting-balance support: No upper extremity supported;Feet supported Sitting balance-Leahy Scale: Good     Standing balance support: No upper extremity supported;During functional activity Standing balance-Leahy Scale:  Good                               Pertinent Vitals/Pain Pain Assessment: Faces Faces Pain Scale: No hurt    Home Living Family/patient expects to be discharged to:: Private residence Living Arrangements: Spouse/significant other Available Help at Discharge: Friend(s) Type of Home: Apartment Home Access: Stairs to enter Entrance Stairs-Rails: Right Entrance Stairs-Number of Steps: 3 flights Home Layout: One level Home Equipment: Environmental consultant - 4 wheels      Prior Function Level of Independence: Independent               Hand Dominance   Dominant Hand: Left    Extremity/Trunk Assessment   Upper Extremity Assessment: Overall WFL for tasks assessed           Lower Extremity Assessment: Overall WFL for tasks assessed         Communication   Communication: Other (comment) (mumbles)  Cognition Arousal/Alertness: Awake/alert Behavior During Therapy: WFL for tasks assessed/performed Overall Cognitive Status: Within Functional Limits for tasks assessed                      General Comments      Exercises        Assessment/Plan    PT Assessment Patent does not need any further PT services  PT Diagnosis Difficulty walking   PT Problem List    PT Treatment Interventions     PT Goals (Current goals can be found in the Care Plan section) Acute Rehab PT Goals PT Goal Formulation: All assessment and education complete, DC therapy    Frequency  Barriers to discharge        Co-evaluation               End of Session   Activity Tolerance: Patient tolerated treatment well Patient left: in bed;with call bell/phone within reach;with family/visitor present Nurse Communication: Mobility status    Functional Assessment Tool Used: clinical judgement Functional Limitation: Mobility: Walking and moving around Mobility: Walking and Moving Around Current Status (U0454): At least 1 percent but less than 20 percent impaired, limited or  restricted Mobility: Walking and Moving Around Goal Status [phone]): At least 1 percent but less than 20 percent impaired, limited or restricted    Time: 1132-1150 PT Time Calculation (min) (ACUTE ONLY): 18 min   Charges:   PT Evaluation $PT Eval Moderate Complexity: 1 Procedure     PT G Codes:   PT G-Codes **NOT FOR INPATIENT CLASS** Functional Assessment Tool Used: clinical judgement Functional Limitation: Mobility: Walking and moving around Mobility: Walking and Moving Around Current Status (B1478): At least 1 percent but less than 20 percent impaired, limited or restricted Mobility: Walking and Moving Around Goal Status [phone]): At least 1 percent but less than 20 percent impaired, limited or restricted    [facility] 07/02/2015, 12:01 PM [facility] PT [phone]

## 2015-07-10 ENCOUNTER — Inpatient Hospital Stay: Payer: Medicaid Other | Admitting: Internal Medicine

## 2015-09-24 ENCOUNTER — Emergency Department (HOSPITAL_COMMUNITY): Payer: Medicaid Other

## 2015-09-24 ENCOUNTER — Encounter (HOSPITAL_COMMUNITY): Payer: Self-pay | Admitting: Emergency Medicine

## 2015-09-24 ENCOUNTER — Inpatient Hospital Stay (HOSPITAL_COMMUNITY)
Admission: EM | Admit: 2015-09-24 | Discharge: 2015-09-30 | DRG: 065 | Disposition: A | Discharge status: Home Health | Payer: Medicaid Other | Attending: Neurology | Admitting: Neurology

## 2015-09-24 DIAGNOSIS — F1290 Cannabis use, unspecified, uncomplicated: Secondary | ICD-10-CM | POA: Diagnosis present

## 2015-09-24 DIAGNOSIS — F209 Schizophrenia, unspecified: Secondary | ICD-10-CM | POA: Diagnosis present

## 2015-09-24 DIAGNOSIS — Z823 Family history of stroke: Secondary | ICD-10-CM | POA: Diagnosis not present

## 2015-09-24 DIAGNOSIS — Z8249 Family history of ischemic heart disease and other diseases of the circulatory system: Secondary | ICD-10-CM

## 2015-09-24 DIAGNOSIS — Z86718 Personal history of other venous thrombosis and embolism: Secondary | ICD-10-CM

## 2015-09-24 DIAGNOSIS — I611 Nontraumatic intracerebral hemorrhage in hemisphere, cortical: Secondary | ICD-10-CM | POA: Diagnosis not present

## 2015-09-24 DIAGNOSIS — I10 Essential (primary) hypertension: Secondary | ICD-10-CM | POA: Diagnosis present

## 2015-09-24 DIAGNOSIS — E119 Type 2 diabetes mellitus without complications: Secondary | ICD-10-CM | POA: Diagnosis present

## 2015-09-24 DIAGNOSIS — E7211 Homocystinuria: Secondary | ICD-10-CM | POA: Diagnosis present

## 2015-09-24 DIAGNOSIS — G43909 Migraine, unspecified, not intractable, without status migrainosus: Secondary | ICD-10-CM | POA: Diagnosis present

## 2015-09-24 DIAGNOSIS — Q211 Atrial septal defect: Secondary | ICD-10-CM | POA: Diagnosis not present

## 2015-09-24 DIAGNOSIS — I69954 Hemiplegia and hemiparesis following unspecified cerebrovascular disease affecting left non-dominant side: Secondary | ICD-10-CM | POA: Diagnosis not present

## 2015-09-24 DIAGNOSIS — I639 Cerebral infarction, unspecified: Secondary | ICD-10-CM

## 2015-09-24 DIAGNOSIS — E785 Hyperlipidemia, unspecified: Secondary | ICD-10-CM | POA: Diagnosis present

## 2015-09-24 DIAGNOSIS — F17210 Nicotine dependence, cigarettes, uncomplicated: Secondary | ICD-10-CM | POA: Diagnosis present

## 2015-09-24 DIAGNOSIS — I619 Nontraumatic intracerebral hemorrhage, unspecified: Secondary | ICD-10-CM | POA: Diagnosis present

## 2015-09-24 DIAGNOSIS — I63411 Cerebral infarction due to embolism of right middle cerebral artery: Secondary | ICD-10-CM | POA: Diagnosis not present

## 2015-09-24 DIAGNOSIS — R4781 Slurred speech: Secondary | ICD-10-CM | POA: Diagnosis present

## 2015-09-24 DIAGNOSIS — F17200 Nicotine dependence, unspecified, uncomplicated: Secondary | ICD-10-CM | POA: Diagnosis not present

## 2015-09-24 DIAGNOSIS — F449 Dissociative and conversion disorder, unspecified: Secondary | ICD-10-CM | POA: Diagnosis present

## 2015-09-24 DIAGNOSIS — I6340 Cerebral infarction due to embolism of unspecified cerebral artery: Secondary | ICD-10-CM | POA: Diagnosis present

## 2015-09-24 DIAGNOSIS — F319 Bipolar disorder, unspecified: Secondary | ICD-10-CM | POA: Diagnosis present

## 2015-09-24 LAB — CBC
HCT: 37.2 % (ref 36.0–46.0)
HEMOGLOBIN: 12.3 g/dL (ref 12.0–15.0)
MCH: 29.2 pg (ref 26.0–34.0)
MCHC: 33.1 g/dL (ref 30.0–36.0)
MCV: 88.4 fL (ref 78.0–100.0)
PLATELETS: 208 10*3/uL (ref 150–400)
RBC: 4.21 MIL/uL (ref 3.87–5.11)
RDW: 13 % (ref 11.5–15.5)
WBC: 4.5 10*3/uL (ref 4.0–10.5)

## 2015-09-24 LAB — COMPREHENSIVE METABOLIC PANEL
ALBUMIN: 3.5 g/dL (ref 3.5–5.0)
ALK PHOS: 98 U/L (ref 38–126)
ALT: 14 U/L (ref 14–54)
ANION GAP: 8 (ref 5–15)
AST: 20 U/L (ref 15–41)
BUN: 10 mg/dL (ref 6–20)
CALCIUM: 8.8 mg/dL — AB (ref 8.9–10.3)
CHLORIDE: 105 mmol/L (ref 101–111)
CO2: 24 mmol/L (ref 22–32)
CREATININE: 1.24 mg/dL — AB (ref 0.44–1.00)
GFR, EST NON AFRICAN AMERICAN: 56 mL/min — AB (ref >60)
Glucose, Bld: 135 mg/dL — ABNORMAL HIGH (ref 65–99)
Potassium: 3.6 mmol/L (ref 3.5–5.1)
Sodium: 137 mmol/L (ref 135–145)
Total Bilirubin: 0.2 mg/dL — ABNORMAL LOW (ref 0.3–1.2)
Total Protein: 6.3 g/dL — ABNORMAL LOW (ref 6.5–8.1)

## 2015-09-24 LAB — DIFFERENTIAL
Basophils Absolute: 0 10*3/uL (ref 0.0–0.1)
Basophils Relative: 0 %
EOS ABS: 0.1 10*3/uL (ref 0.0–0.7)
EOS PCT: 3 %
LYMPHS ABS: 1.7 10*3/uL (ref 0.7–4.0)
LYMPHS PCT: 38 %
MONO ABS: 0.4 10*3/uL (ref 0.1–1.0)
Monocytes Relative: 8 %
NEUTROS PCT: 51 %
Neutro Abs: 2.3 10*3/uL (ref 1.7–7.7)

## 2015-09-24 LAB — PROTIME-INR
INR: 1.07 (ref 0.00–1.49)
PROTHROMBIN TIME: 14.1 s (ref 11.6–15.2)

## 2015-09-24 LAB — I-STAT CHEM 8, ED
BUN: 10 mg/dL (ref 6–20)
CALCIUM ION: 1.01 mmol/L — AB (ref 1.12–1.23)
Chloride: 104 mmol/L (ref 101–111)
Creatinine, Ser: 1.2 mg/dL — ABNORMAL HIGH (ref 0.44–1.00)
Glucose, Bld: 125 mg/dL — ABNORMAL HIGH (ref 65–99)
HEMATOCRIT: 40 % (ref 36.0–46.0)
HEMOGLOBIN: 13.6 g/dL (ref 12.0–15.0)
Potassium: 3.5 mmol/L (ref 3.5–5.1)
SODIUM: 139 mmol/L (ref 135–145)
TCO2: 20 mmol/L (ref 0–100)

## 2015-09-24 LAB — APTT: aPTT: 26 seconds (ref 24–37)

## 2015-09-24 LAB — ETHANOL: Alcohol, Ethyl (B): <5 mg/dL (ref <5)

## 2015-09-24 LAB — I-STAT TROPONIN, ED: TROPONIN I, POC: 0.02 ng/mL (ref 0.00–0.08)

## 2015-09-24 MED ORDER — LABETALOL HCL 5 MG/ML IV SOLN: 10.0000 mg | INTRAVENOUS | Status: DC | PRN

## 2015-09-24 MED ORDER — PANTOPRAZOLE SODIUM 40 MG IV SOLR: 40.0000 mg | Freq: Every day | INTRAVENOUS | Status: DC

## 2015-09-24 MED ORDER — ACETAMINOPHEN 325 MG PO TABS
650.0000 mg | ORAL_TABLET | ORAL | Status: DC | PRN
  Administered 2015-09-25: 650 mg via ORAL
  Filled 2015-09-24: qty 2

## 2015-09-24 MED ORDER — ACETAMINOPHEN 650 MG RE SUPP: 650.0000 mg | RECTAL | Status: DC | PRN

## 2015-09-24 MED ORDER — SENNOSIDES-DOCUSATE SODIUM 8.6-50 MG PO TABS
1.0000 | ORAL_TABLET | Freq: Two times a day (BID) | ORAL | Status: DC
  Administered 2015-09-25 – 2015-09-30 (×10): 1 via ORAL
  Filled 2015-09-24 (×11): qty 1

## 2015-09-24 MED ORDER — STROKE: EARLY STAGES OF RECOVERY BOOK
Freq: Once | Status: AC
  Administered 2015-09-25: 07:00:00
  Filled 2015-09-24 (×2): qty 1

## 2015-09-24 NOTE — H&P (Addendum)
NEURO HOSPITALIST H&P     Reason for Consult: code stroke   History obtained from:  Patient     HPI:                                                                                                                                          Tanya Mayer is an 35 yo female with multiple psychiatric history and hx of CVA with left sided residual weakness presented with acute onset of worsening left sided weakness and speech that started at 830pm.   TPA not given due to possible ICH on [facility]  Past Medical History  Diagnosis Date  . Hypertension   . Heart murmur     "born w/one"  . Hyperlipemia   . Anginal pain (HCC)   . DVT (deep venous thrombosis) (HCC) after 2008    "BLE"  . Bronchial asthma   . Anemia   . GERD (gastroesophageal reflux disease)   . Migraine     "weekly" (03/19/2015)  . Chronic lower back pain   . Bipolar disorder (HCC)   . Schizophrenia (HCC)   . DM (diabetes mellitus) type II uncontrolled, periph vascular disorder (HCC)     notes 01/03/2015  . Seizures (HCC)     epilepsy/notes 02/27/2015  . Stroke (HCC) 04/2014; 09/2014(/notes 04/25/2015);  12/2014; 01/2015; 02/2015    "left side weakness; speech problems since" (03/19/2015)  . Stroke (HCC) 03/19/2015    "left side weakness; speech problems" (03/19/2015)  . PFO (patent foramen ovale)     large, per TEE 03/01/2015    Past Surgical History  Procedure Laterality Date  . 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 Tee without cardioversion N/A 03/01/2015    Procedure: TRANSESOPHAGEAL ECHOCARDIOGRAM (TEE);  Surgeon: Thurmon Fair, MD;  Location: [facility] ENDOSCOPY;  Service: Cardiovascular;  Laterality: N/A;  . Hernia repair    . Umbilical hernia repair  "@ birth"  . Cesarean section  2008  . Tubal ligation  2008    Family History  Problem Relation Age of Onset  . Hypertension Mother   . Clotting disorder Mother     hypercoagulable disorder on mother side  . Heart attack Mother       Social  History:  reports that she has been smoking Cigarettes.  She has a 6.93 pack-year smoking history. She has never used smokeless tobacco. She reports that she drinks alcohol. She reports that she uses illicit drugs (Marijuana).  Allergies  Allergen Reactions  . Carrot [Daucus Carota] Anaphylaxis  . Eggs Or Egg-Derived Products Anaphylaxis and Swelling  . Mushroom Extract Complex Anaphylaxis    MEDICATIONS:  I have reviewed the patient's current medications.   ROS:                                                                                                                                       History obtained from chart review and the patient  General ROS: negative for - chills, fatigue, fever, night sweats, weight gain or weight loss Psychological ROS: negative for - behavioral disorder, hallucinations, memory difficulties, mood swings or suicidal ideation Ophthalmic ROS: negative for - blurry vision, double vision, eye pain or loss of vision ENT ROS: negative for - epistaxis, nasal discharge, oral lesions, sore throat, tinnitus or vertigo Allergy and Immunology ROS: negative for - hives or itchy/watery eyes Hematological and Lymphatic ROS: negative for - bleeding problems, bruising or swollen lymph nodes Endocrine ROS: negative for - galactorrhea, hair pattern changes, polydipsia/polyuria or temperature intolerance Respiratory ROS: negative for - cough, hemoptysis, shortness of breath or wheezing Cardiovascular ROS: negative for - chest pain, dyspnea on exertion, edema or irregular heartbeat Gastrointestinal ROS: negative for - abdominal pain, diarrhea, hematemesis, nausea/vomiting or stool incontinence Genito-Urinary ROS: negative for - dysuria, hematuria, incontinence or urinary frequency/urgency Musculoskeletal ROS: negative for - joint swelling or muscular  weakness Neurological ROS: as noted in HPI Dermatological ROS: negative for rash and skin lesion changes   Blood pressure 131/76, pulse 64, temperature 98.4 F (36.9 C), temperature source Oral, resp. rate 16, height 5' (1.524 m), last menstrual period 09/22/2015, SpO2 100 %.   Neurologic Examination:                                                                                                      HEENT-  Normocephalic, no lesions, without obvious abnormality.  Normal external eye and conjunctiva.  Normal TM's bilaterally.  Normal auditory canals and external ears. Normal external nose, mucus membranes and septum.  Normal pharynx. Cardiovascular- regular rate and rhythm, S1, S2 normal, no murmur, click, rub or gallop, pulses palpable throughout   Lungs- chest clear, no wheezing, rales, normal symmetric air entry, Heart exam - S1, S2 normal, no murmur, no gallop, rate regular Abdomen- soft, non-tender; bowel sounds normal; no masses,  no organomegaly   Neurological Examination Mental Status: Alert, oriented, thought content appropriate.  Speech is slow with mild dysarthria .  Able to follow 3 step commands without difficulty. Cranial Nerves: II: Discs flat bilaterally; Visual fields grossly normal, pupils equal, round, reactive to light and accommodation III,IV, VI: ptosis not present, extra-ocular  motions intact bilaterally V,VII: smile asymmetric, left facial droop, decreased L facial sensation VIII: hearing normal bilaterally IX,X: uvula rises symmetrically XI: bilateral shoulder shrug XII: midline tongue extension Motor: Right : Upper extremity   5/5    Left:     Upper extremity   0/5 functional weakness  Lower extremity   5/5     Lower extremity   0/5 Hoover positive  Tone and bulk:normal tone throughout; no atrophy noted Sensory: Pinprick and light touch decreased on the Left  Cerebellar: normal finger-to-nose normal on the Right       Lab Results: Basic Metabolic  Panel:  Recent Labs Lab 09/24/15 2224  NA 139  K 3.5  CL 104  GLUCOSE 125*  BUN 10  CREATININE 1.20*    Liver Function Tests: No results for input(s): AST, ALT, ALKPHOS, BILITOT, PROT, ALBUMIN in the last 168 hours. No results for input(s): LIPASE, AMYLASE in the last 168 hours. No results for input(s): AMMONIA in the last 168 hours.  CBC:  Recent Labs Lab 09/24/15 2210 09/24/15 2224  WBC 4.5  --   NEUTROABS 2.3  --   HGB 12.3 13.6  HCT 37.2 40.0  MCV 88.4  --   PLT 208  --     Cardiac Enzymes: No results for input(s): CKTOTAL, CKMB, CKMBINDEX, TROPONINI in the last 168 hours.  Lipid Panel: No results for input(s): CHOL, TRIG, HDL, CHOLHDL, VLDL, LDLCALC in the last 168 hours.  CBG: No results for input(s): GLUCAP in the last 168 hours.  Microbiology: Results for orders placed or performed during the hospital encounter of 02/27/15  Urine culture     Status: None   Collection Time: 02/27/15 11:30 AM  Result Value Ref Range Status   Specimen Description URINE, RANDOM  Final   Special Requests NONE  Final   Culture MULTIPLE SPECIES PRESENT, SUGGEST RECOLLECTION  Final   Report Status 02/28/2015 FINAL  Final    Coagulation Studies:  Recent Labs  09/24/15 2210  LABPROT 14.1  INR 1.07    Imaging: Ct Head Wo Contrast  09/24/2015  CLINICAL DATA:  Code stroke, last seen normal at 2030 hours, LEFT-sided weakness, history of prior stroke, hypertension, hyperlipidemia, type II diabetes mellitus, smoker, seizures EXAM: CT HEAD WITHOUT CONTRAST TECHNIQUE: Contiguous axial images were obtained from the base of the skull through the vertex without intravenous contrast. COMPARISON:  CT head 02/27/2015, MR brain 03/19/2015 FINDINGS: Normal ventricular morphology. No midline shift or mass effect. Small vessel chronic ischemic changes of deep cerebral white matter. Tiny focus of high attenuation in the LEFT frontal lobe image 10, persists on thin section reconstructions  through site, cannot exclude a tiny focus of high attenuation acute punctate hemorrhage; this finding is new since the prior exam. No extra-axial fluid collections, mass lesion, or definite evidence of acute infarction. Visualized paranasal sinuses and mastoid air cells clear. No acute osseous findings. IMPRESSION: Tiny high attenuation focus LEFT frontal lobe, cannot exclude focus of acute punctate hemorrhage. Remainder of exam unremarkable. Findings called to Dr.  Bebe ShaggyWickline On 09/24/2015 at 2245 hours. Electronically Signed   By: Ulyses SouthwardMark  Boles M.D.   On: 09/24/2015 22:46        Assessment/Plan:  35 yo female with multiple psychiatric history and hx of CVA with left sided residual weakness presented with acute onset of worsening left sided weakness and speech that started at 830pm.  CTH shows tiny small area of possible hemorrhage in the area of old stroke.  -  Admit to neuro ICU - ICU score of 0 - Frequent neuro checks - CTA head and neck - MRI brain without contrast - Hold plavix - Hold Sub Q heparin - SCD's  - SBP < 140 - 24hr repeat CTH, if stable would restart plavix in 48hrs

## 2015-09-24 NOTE — ED Notes (Signed)
The patient called EMS due to worsening left sided weakness.  She advised EMS she had a previous stroke in 06/2014 and had left sided weakness.  She is alert and oriented following commands.  She cannot feel her left side, she said she feels numb.  The patient lives alone and said she began to feel her left side getting worse at 2030.  She advises her head hurts and rates it at 10/10.  She advises it started hurting right before she called EMS.

## 2015-09-24 NOTE — ED Provider Notes (Signed)
CSN: [id number]     Arrival date & time 09/24/15  2213 History   First MD Initiated Contact with Patient 09/24/15 2305     Chief Complaint  Patient presents with  . Code Stroke    The patient called EMS due to worsening left sided weakness.  She advised EMS she had a previous stroke in 06/2014 and had left sided weakness.  She is alert and oriented following commands.  She cannot feel her left side, she said she feels numb.     (Consider location/radiation/quality/duration/timing/severity/associated sxs/prior Treatment) HPI   Tanya Mayer is a 35yo female here with headache, L side weakness and slurred speech since 8:30pm with history of stroke.  Code stroke called and neurology is consulting.  10 Systems reviewed and are negative for acute change except as noted in the HPI.   Past Medical History  Diagnosis Date  . Hypertension   . Heart murmur     "born w/one"  . Hyperlipemia   . Anginal pain (HCC)   . DVT (deep venous thrombosis) (HCC) after 2008    "BLE"  . Bronchial asthma   . Anemia   . GERD (gastroesophageal reflux disease)   . Migraine     "weekly" (03/19/2015)  . Chronic lower back pain   . Bipolar disorder (HCC)   . Schizophrenia (HCC)   . DM (diabetes mellitus) type II uncontrolled, periph vascular disorder (HCC)     notes 01/03/2015  . Seizures (HCC)     epilepsy/notes 02/27/2015  . Stroke (HCC) 04/2014; 09/2014(/notes 04/25/2015);  12/2014; 01/2015; 02/2015    "left side weakness; speech problems since" (03/19/2015)  . Stroke (HCC) 03/19/2015    "left side weakness; speech problems" (03/19/2015)  . PFO (patent foramen ovale)     large, per TEE 03/01/2015   Past Surgical History  Procedure Laterality Date  . 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 Tee without cardioversion N/A 03/01/2015    Procedure: TRANSESOPHAGEAL ECHOCARDIOGRAM (TEE);  Surgeon: Thurmon Fair, MD;  Location: [facility] ENDOSCOPY;  Service: Cardiovascular;  Laterality: N/A;  . Hernia repair    . Umbilical  hernia repair  "@ birth"  . Cesarean section  2008  . Tubal ligation  2008   Family History  Problem Relation Age of Onset  . Hypertension Mother   . Clotting disorder Mother     hypercoagulable disorder on mother side  . Heart attack Mother    Social History  Substance Use Topics  . Smoking status: Current Every Day Smoker -- 0.33 packs/day for 21 years    Types: Cigarettes  . Smokeless tobacco: Never Used  . Alcohol Use: 0.0 oz/week    0 Standard drinks or equivalent per week     Comment: 03/19/2015 "a couple times/month"   OB History    No data available     Review of Systems    Allergies  Carrot; Eggs or egg-derived products; and Mushroom extract complex  Home Medications   Prior to Admission medications   Medication Sig Start Date End Date Taking? Authorizing Provider  ALPRAZolam Prudy Feeler) 1 MG tablet Take 1 tablet (1 mg total) by mouth 2 (two) times daily as needed for anxiety. 07/02/15  Yes Ripudeep Jenna Luo, MD  clopidogrel (PLAVIX) 75 MG tablet Take 1 tablet (75 mg total) by mouth daily. 07/02/15  Yes Ripudeep Jenna Luo, MD  famotidine (PEPCID) 20 MG tablet Take 1 tablet (20 mg total) by mouth 2 (two) times daily. 07/02/15  Yes Ripudeep Jenna Luo, MD  hydrOXYzine (VISTARIL) 50 MG capsule Take 1 capsule (50 mg total) by mouth at bedtime. 07/02/15  Yes Ripudeep Jenna Luo, MD  QUEtiapine (SEROQUEL) 300 MG tablet Take 1 tablet (300 mg total) by mouth at bedtime. 07/02/15  Yes Ripudeep Jenna Luo, MD  risperiDONE (RISPERDAL) 0.25 MG tablet Take 1 tablet (0.25 mg total) by mouth 2 (two) times daily. 07/02/15  Yes Ripudeep Jenna Luo, MD  trazodone (DESYREL) 300 MG tablet Take 1 tablet (300 mg total) by mouth at bedtime. 07/02/15  Yes Ripudeep Jenna Luo, MD  acetaminophen (TYLENOL) 500 MG tablet Take 500 mg by mouth every 6 (six) hours as needed for moderate pain.    Historical Provider, MD  albuterol (PROVENTIL HFA;VENTOLIN HFA) 108 (90 Base) MCG/ACT inhaler Inhale 2 puffs into the lungs every 6 (six) hours  as needed for wheezing or shortness of breath. 07/02/15   Ripudeep Jenna Luo, MD  aspirin EC 81 MG EC tablet Take 1 tablet (81 mg total) by mouth daily. 03/22/15   Kathee Delton, MD  atorvastatin (LIPITOR) 40 MG tablet Take 1 tablet (40 mg total) by mouth daily at 6 PM. Patient not taking: Reported on 09/24/2015 07/02/15   Ripudeep Jenna Luo, MD  diphenhydramine-acetaminophen (TYLENOL PM) 25-500 MG TABS tablet Take 1 tablet by mouth at bedtime as needed (sleep).    Historical Provider, MD  gabapentin (NEURONTIN) 300 MG capsule Take 3 capsules (900 mg total) by mouth 3 (three) times daily. 07/02/15   Ripudeep Jenna Luo, MD  ibuprofen (ADVIL,MOTRIN) 800 MG tablet Take 800 mg by mouth every 8 (eight) hours as needed for moderate pain.     Historical Provider, MD  insulin detemir (LEVEMIR) 100 UNIT/ML injection Inject 0.1 mLs (10 Units total) into the skin at bedtime. 07/02/15   Ripudeep Jenna Luo, MD  oxycodone (ROXICODONE) 30 MG immediate release tablet Take 1 tablet (30 mg total) by mouth every 8 (eight) hours. 07/02/15   Ripudeep Jenna Luo, MD  predniSONE (STERAPRED UNI-PAK 21 TAB) 10 MG (21) TBPK tablet Take as directed on the pack Patient not taking: Reported on 09/24/2015 07/02/15   Ripudeep K Rai, MD   BP 113/70 mmHg  Pulse 65  Temp(Src) 98.4 F (36.9 C) (Oral)  Resp 19  Ht 5' (1.524 m)  SpO2 99%  LMP 09/22/2015 Physical Exam  Constitutional: She is oriented to person, place, and time. She appears well-developed and well-nourished. No distress.  HENT:  Head: Normocephalic and atraumatic.  Nose: Nose normal.  Mouth/Throat: Oropharynx is clear and moist. No oropharyngeal exudate.  Eyes: Conjunctivae and EOM are normal. Pupils are equal, round, and reactive to light. No scleral icterus.  Neck: Normal range of motion. Neck supple. No JVD present. No tracheal deviation present. No thyromegaly present.  Cardiovascular: Normal rate, regular rhythm and normal heart sounds.  Exam reveals no gallop and no friction rub.    No murmur heard. Pulmonary/Chest: Effort normal and breath sounds normal. No respiratory distress. She has no wheezes. She exhibits no tenderness.  Abdominal: Soft. Bowel sounds are normal. She exhibits no distension and no mass. There is no tenderness. There is no rebound and no guarding.  Musculoskeletal: Normal range of motion. She exhibits no edema or tenderness.  Lymphadenopathy:    She has no cervical adenopathy.  Neurological: She is alert and oriented to person, place, and time. No cranial nerve deficit. She exhibits normal muscle tone.  L side weakness with slurred speech   Skin: Skin  is warm and dry. No rash noted. No erythema. No pallor.  Nursing note and vitals reviewed.   ED Course  Procedures (including critical care time) Labs Review Labs Reviewed  COMPREHENSIVE METABOLIC PANEL - Abnormal; Notable for the following:    Glucose, Bld 135 (*)    Creatinine, Ser 1.24 (*)    Calcium 8.8 (*)    Total Protein 6.3 (*)    Total Bilirubin 0.2 (*)    GFR calc non Af Amer 56 (*)    All other components within normal limits  GLUCOSE, CAPILLARY - Abnormal; Notable for the following:    Glucose-Capillary 106 (*)    All other components within normal limits  I-STAT CHEM 8, ED - Abnormal; Notable for the following:    Creatinine, Ser 1.20 (*)    Glucose, Bld 125 (*)    Calcium, Ion 1.01 (*)    All other components within normal limits  MRSA PCR SCREENING  ETHANOL  PROTIME-INR  APTT  CBC  DIFFERENTIAL  URINE RAPID DRUG SCREEN, HOSP PERFORMED  URINALYSIS, ROUTINE W REFLEX MICROSCOPIC (NOT [facility])  LIPID PANEL  HEMOGLOBIN A1C  TSH  VITAMIN B12  RPR  HIV ANTIBODY (ROUTINE TESTING)  I-STAT TROPOININ, ED    Imaging Review Ct Head Wo Contrast  09/24/2015  CLINICAL DATA:  Code stroke, last seen normal at 2030 hours, LEFT-sided weakness, history of prior stroke, hypertension, hyperlipidemia, type II diabetes mellitus, smoker, seizures EXAM: CT HEAD WITHOUT CONTRAST  TECHNIQUE: Contiguous axial images were obtained from the base of the skull through the vertex without intravenous contrast. COMPARISON:  CT head 02/27/2015, MR brain 03/19/2015 FINDINGS: Normal ventricular morphology. No midline shift or mass effect. Small vessel chronic ischemic changes of deep cerebral white matter. Tiny focus of high attenuation in the LEFT frontal lobe image 10, persists on thin section reconstructions through site, cannot exclude a tiny focus of high attenuation acute punctate hemorrhage; this finding is new since the prior exam. No extra-axial fluid collections, mass lesion, or definite evidence of acute infarction. Visualized paranasal sinuses and mastoid air cells clear. No acute osseous findings. IMPRESSION: Tiny high attenuation focus LEFT frontal lobe, cannot exclude focus of acute punctate hemorrhage. Remainder of exam unremarkable. Findings called to Dr.  Bebe ShaggyWickline On 09/24/2015 at 2245 hours. Electronically Signed   By: Ulyses SouthwardMark  Boles M.D.   On: 09/24/2015 22:46   I have personally reviewed and evaluated these images and lab results as part of my medical decision-making.   EKG Interpretation None      MDM   Final diagnoses:  Hemorrhagic stroke [facility](HCC)   Patient presents to the ED for L side weakness and slurred speech.  Code stroke called 45min prior to my arrival in the ED.  CT reveals hem. Stroke.  Neurology has already evaluated the patient and will admit for further care.    CRITICAL CARE Performed by: Tomasita CrumbleNI,Arelie Kuzel   Total critical care time: 40 minutes - hem stroke  Critical care time was exclusive of separately billable procedures and treating other patients.  Critical care was necessary to treat or prevent imminent or life-threatening deterioration.  Critical care was time spent personally by me on the following activities: development of treatment plan with patient and/or surrogate as well as nursing, discussions with consultants, evaluation of patient's  response to treatment, examination of patient, obtaining history from patient or surrogate, ordering and performing treatments and interventions, ordering and review of laboratory studies, ordering and review of radiographic studies, pulse oximetry and re-evaluation  of patient's condition.    Tomasita Crumble, MD 09/25/15 [phone]

## 2015-09-25 ENCOUNTER — Inpatient Hospital Stay (HOSPITAL_COMMUNITY): Payer: Medicaid Other

## 2015-09-25 DIAGNOSIS — F449 Dissociative and conversion disorder, unspecified: Secondary | ICD-10-CM

## 2015-09-25 DIAGNOSIS — I63411 Cerebral infarction due to embolism of right middle cerebral artery: Secondary | ICD-10-CM

## 2015-09-25 DIAGNOSIS — Q211 Atrial septal defect: Secondary | ICD-10-CM

## 2015-09-25 DIAGNOSIS — I10 Essential (primary) hypertension: Secondary | ICD-10-CM

## 2015-09-25 DIAGNOSIS — E785 Hyperlipidemia, unspecified: Secondary | ICD-10-CM

## 2015-09-25 LAB — MRSA PCR SCREENING: MRSA by PCR: NEGATIVE

## 2015-09-25 LAB — RAPID URINE DRUG SCREEN, HOSP PERFORMED
AMPHETAMINES: NOT DETECTED
BENZODIAZEPINES: NOT DETECTED
Barbiturates: NOT DETECTED
Cocaine: NOT DETECTED
OPIATES: NOT DETECTED
TETRAHYDROCANNABINOL: POSITIVE — AB

## 2015-09-25 LAB — URINALYSIS, ROUTINE W REFLEX MICROSCOPIC
BILIRUBIN URINE: NEGATIVE
GLUCOSE, UA: NEGATIVE mg/dL
HGB URINE DIPSTICK: NEGATIVE
Ketones, ur: NEGATIVE mg/dL
Nitrite: POSITIVE — AB
PH: 6 (ref 5.0–8.0)
Protein, ur: 100 mg/dL — AB
SPECIFIC GRAVITY, URINE: 1.02 (ref 1.005–1.030)

## 2015-09-25 LAB — LIPID PANEL
CHOL/HDL RATIO: 4.2 ratio
Cholesterol: 154 mg/dL (ref 0–200)
HDL: 37 mg/dL — AB (ref >40)
LDL CALC: 97 mg/dL (ref 0–99)
TRIGLYCERIDES: 101 mg/dL (ref <150)
VLDL: 20 mg/dL (ref 0–40)

## 2015-09-25 LAB — URINE MICROSCOPIC-ADD ON

## 2015-09-25 LAB — GLUCOSE, CAPILLARY
GLUCOSE-CAPILLARY: 99 mg/dL (ref 65–99)
GLUCOSE-CAPILLARY: 145 mg/dL — AB (ref 65–99)
Glucose-Capillary: 106 mg/dL — ABNORMAL HIGH (ref 65–99)

## 2015-09-25 LAB — VITAMIN B12: Vitamin B-12: 369 pg/mL (ref 180–914)

## 2015-09-25 LAB — RPR: RPR Ser Ql: NONREACTIVE

## 2015-09-25 LAB — TSH: TSH: 0.642 u[IU]/mL (ref 0.350–4.500)

## 2015-09-25 LAB — HIV ANTIBODY (ROUTINE TESTING W REFLEX): HIV SCREEN 4TH GENERATION: NONREACTIVE

## 2015-09-25 MED ORDER — ASPIRIN EC 325 MG PO TBEC
325.0000 mg | DELAYED_RELEASE_TABLET | Freq: Every day | ORAL | Status: DC
  Administered 2015-09-25 – 2015-09-30 (×6): 325 mg via ORAL
  Filled 2015-09-25 (×6): qty 1

## 2015-09-25 MED ORDER — PANTOPRAZOLE SODIUM 40 MG PO TBEC
40.0000 mg | DELAYED_RELEASE_TABLET | Freq: Every day | ORAL | Status: DC
  Administered 2015-09-25 – 2015-09-30 (×5): 40 mg via ORAL
  Filled 2015-09-25 (×5): qty 1

## 2015-09-25 MED ORDER — OXYCODONE HCL 5 MG PO TABS
30.0000 mg | ORAL_TABLET | Freq: Four times a day (QID) | ORAL | Status: DC | PRN
  Administered 2015-09-25 – 2015-09-30 (×7): 30 mg via ORAL
  Filled 2015-09-25 (×10): qty 6

## 2015-09-25 MED ORDER — ATORVASTATIN CALCIUM 40 MG PO TABS
40.0000 mg | ORAL_TABLET | Freq: Every day | ORAL | Status: DC
  Administered 2015-09-25 – 2015-09-29 (×5): 40 mg via ORAL
  Filled 2015-09-25 (×5): qty 1

## 2015-09-25 MED ORDER — ALPRAZOLAM 0.5 MG PO TABS
1.0000 mg | ORAL_TABLET | Freq: Two times a day (BID) | ORAL | Status: DC | PRN
  Administered 2015-09-30: 1 mg via ORAL
  Filled 2015-09-25 (×2): qty 2

## 2015-09-25 MED ORDER — GABAPENTIN 300 MG PO CAPS
900.0000 mg | ORAL_CAPSULE | Freq: Three times a day (TID) | ORAL | Status: DC
  Administered 2015-09-25 – 2015-09-30 (×17): 900 mg via ORAL
  Filled 2015-09-25 (×17): qty 3

## 2015-09-25 MED ORDER — RISPERIDONE 0.5 MG PO TABS
0.2500 mg | ORAL_TABLET | Freq: Two times a day (BID) | ORAL | Status: DC
  Administered 2015-09-25 – 2015-09-30 (×11): 0.25 mg via ORAL
  Filled 2015-09-25 (×11): qty 1

## 2015-09-25 NOTE — Evaluation (Signed)
Speech Language Pathology Evaluation Patient Details Name: Jackquline BerlinLatonya Scholten MRN: [id number] DOB: 10/12/1980 Today's Date: 09/25/2015 Time: [id number] SLP Time Calculation (min) (ACUTE ONLY): 22 min  Problem List:  Patient Active Problem List   Diagnosis Date Noted  . ICH (intracerebral hemorrhage) (HCC) 09/24/2015  . Anaphylaxis 07/01/2015  . Presence of IVC filter   . Essential hypertension, benign   . Pain in the chest   . Pain   . HLD (hyperlipidemia)   . CVA (cerebral infarction) 03/19/2015  . Chest pain   . Cerebral infarction due to unspecified mechanism   . UTI (lower urinary tract infection)   . Cryptogenic stroke (HCC)   . Type 2 diabetes mellitus with diabetic neuropathy, with long-term current use of insulin (HCC)   . Acute thromboembolic cerebrovascular accident (CVA) (HCC)   . PFO (patent foramen ovale)   . 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 Schizophrenia (HCC)   . Bipolar I disorder (HCC)   . Seizure disorder (HCC)   . Acute encephalopathy 02/27/2015  . Stroke (HCC) 01/03/2015  . Essential hypertension 01/03/2015  . DM (diabetes mellitus) type II controlled, neurological manifestation (HCC) 01/03/2015  . Hyperlipidemia 01/03/2015  . Hemiparesis affecting left side as late effect of cerebrovascular accident (HCC) 01/03/2015  . Tobacco abuse 01/03/2015  . Marijuana abuse 01/03/2015  . Asthma 01/03/2015  . History of DVT of lower extremity 01/03/2015   Past Medical History:  Past Medical History  Diagnosis Date  . Hypertension   . Heart murmur     "born w/one"  . Hyperlipemia   . Anginal pain (HCC)   . DVT (deep venous thrombosis) (HCC) after 2008    "BLE"  . Bronchial asthma   . Anemia   . GERD (gastroesophageal reflux disease)   . Migraine     "weekly" (03/19/2015)  . Chronic lower back pain   . Bipolar disorder (HCC)   . Schizophrenia (HCC)   . DM (diabetes mellitus) type II uncontrolled, periph vascular  disorder (HCC)     notes 01/03/2015  . Seizures (HCC)     epilepsy/notes 02/27/2015  . Stroke (HCC) 04/2014; 09/2014(/notes 04/25/2015);  12/2014; 01/2015; 02/2015    "left side weakness; speech problems since" (03/19/2015)  . Stroke (HCC) 03/19/2015    "left side weakness; speech problems" (03/19/2015)  . PFO (patent foramen ovale)     large, per TEE 03/01/2015   Past Surgical History:  Past Surgical History  Procedure Laterality Date  . 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 Tee without cardioversion N/A 03/01/2015    Procedure: TRANSESOPHAGEAL ECHOCARDIOGRAM (TEE);  Surgeon: Thurmon FairMihai Croitoru, MD;  [facility];  Service: Cardiovascular;  Laterality: N/A;  . Hernia repair    . Umbilical hernia repair  "@ birth"  . Cesarean section  2008  . Tubal ligation  2008   HPI:  35 yo female with multiple psychiatric history and hx of CVA with left sided residual weakness presented with acute onset of worsening left sided weakness and speech. Pt has been seen for previous swallow evaluations with recommendations for regular diet and thin liquids. She has had some mild impairment with short-term memory and mild dysarthria.    Assessment / Plan / Recommendation Clinical Impression  Pt is lethargic but with improving alertness as session progresses. She is oriented x3 but disoriented to time, with impaired sustained attention that impacts her ability to store new information. She needs Min cues  for following one-step commands and to demonstrate adequate intellectual awareness. Her speech is moderately dysarthric, but again improves as session continues and as pt appears to become more motivated to speak, such as when requesting pain medications for headache (RN aware of request). Will continue to follow pt to maximize functional communication and cognition.    SLP Assessment  Patient needs continued Speech Lanaguage Pathology Services    Follow Up Recommendations   (tba)    Frequency and Duration min  2x/week  2 weeks      SLP Evaluation Prior Functioning  Cognitive/Linguistic Baseline: Baseline deficits Baseline deficit details: per chart, had some mild dysarthria and short-term memory deficits  Lives With: Alone Available Help at Discharge: Neighbor   Cognition  Overall Cognitive Status: Impaired/Different from baseline Arousal/Alertness: Lethargic Orientation Level: Oriented to person;Oriented to place;Oriented to situation;Disoriented to time Attention: Sustained Sustained Attention: Impaired Sustained Attention Impairment: Verbal basic Memory: Impaired Memory Impairment: Storage deficit Awareness: Impaired Awareness Impairment: Intellectual impairment Comments: function appeared to improve during self-feeding task    Comprehension  Auditory Comprehension Overall Auditory Comprehension: Impaired Commands:  (difficult to assess - appeared to need Min cues)    Expression Expression Primary Mode of Expression: Verbal Verbal Expression Overall Verbal Expression: Appears within functional limits for tasks assessed   Oral / Motor  Oral Motor/Sensory Function Overall Oral Motor/Sensory Function: Mild impairment (mild-mod) Facial ROM: Reduced right;Reduced left Facial Symmetry: Within Functional Limits Facial Strength: Reduced right;Reduced left Lingual ROM: Reduced right;Reduced left Lingual Symmetry: Within Functional Limits Lingual Strength: Reduced Mandible: Impaired Motor Speech Overall Motor Speech: Impaired Respiration: Impaired Level of Impairment: Phrase Phonation: Low vocal intensity;Other (comment) (monotonous) Articulation: Impaired Level of Impairment: Phrase Intelligibility: Intelligibility reduced Phrase: 50-74% accurate Interfering Components: Premorbid status (per notes, but pt reports her speech had returned to normal) Effective Techniques: Slow rate;Increased vocal intensity   GO                   Maxcine Ham, M.A.  CCC-SLP [phone]  Maxcine Ham 09/25/2015, 10:08 AM

## 2015-09-25 NOTE — Progress Notes (Signed)
STROKE TEAM PROGRESS NOTE   HISTORY OF PRESENT ILLNESS Tanya Mayer is an 35 yo female with multiple psychiatric history and hx of CVA with left sided residual weakness presented with acute onset of worsening left sided weakness and speech that started at 830pm on 09/24/2015. Patient was not administered IV t-PA. She was admitted to stepdown ICU for further evaluation and treatment.   SUBJECTIVE (INTERVAL HISTORY) Her RN is at the bedside.  Overall she feels her condition is unchanged. She has significant functional component with give away weakness and lack of effort. She had recent argument with her Transport planner. She demanded ground floor housing due to disability but manager disagree as she appears to be not disabled by appearance. Reviewed CT image with Dr. Margo Aye radiologist and no concern for ICH. However, MRI did showed right punctate temporal lobe infarct.    OBJECTIVE Temp:  [97.8 F (36.6 C)-98.4 F (36.9 C)] 97.8 F (36.6 C) (05/17 0910) Pulse Rate:  [54-73] 54 (05/17 0910) Cardiac Rhythm:  [-] Sinus bradycardia (05/17 0910) Resp:  [14-19] 16 (05/17 0910) BP: (101-131)/(67-89) 123/81 mmHg (05/17 0910) SpO2:  [97 %-100 %] 99 % (05/17 0910)  CBC:  Recent Labs Lab 09/24/15 2210 09/24/15 2224  WBC 4.5  --   NEUTROABS 2.3  --   HGB 12.3 13.6  HCT 37.2 40.0  MCV 88.4  --   PLT 208  --     Basic Metabolic Panel:  Recent Labs Lab 09/24/15 2210 09/24/15 2224  NA 137 139  K 3.6 3.5  CL 105 104  CO2 24  --   GLUCOSE 135* 125*  BUN 10 10  CREATININE 1.24* 1.20*  CALCIUM 8.8*  --     Lipid Panel:    Component Value Date/Time   CHOL 154 09/25/2015 0638   TRIG 101 09/25/2015 0638   HDL 37* 09/25/2015 0638   CHOLHDL 4.2 09/25/2015 0638   VLDL 20 09/25/2015 0638   LDLCALC 97 09/25/2015 0638   HgbA1c:  Lab Results  Component Value Date   HGBA1C 5.7* 02/28/2015   Urine Drug Screen:    Component Value Date/Time   LABOPIA NONE DETECTED 09/25/2015 0645    COCAINSCRNUR NONE DETECTED 09/25/2015 0645   LABBENZ NONE DETECTED 09/25/2015 0645   AMPHETMU NONE DETECTED 09/25/2015 0645   THCU POSITIVE* 09/25/2015 0645   LABBARB NONE DETECTED 09/25/2015 0645      IMAGING I have personally reviewed the radiological images below and agree with the radiology interpretations.  Ct Head Wo Contrast 09/24/2015  Tiny high attenuation focus LEFT frontal lobe, cannot exclude focus of acute punctate hemorrhage. Remainder of exam unremarkable.   Mri & Mra Brain Wo Contrast 09/25/2015  1. Punctate acute infarct in the posterior right temporal lobe. No mass effect or hemorrhage. 2. Stable and normal intracranial MRA. 3. No finding to correspond to the punctate hyperdensity in the anterior left frontal lobe questioned on CT, and I favor that was artifact. 4. Expected encephalomalacia at the site of the right middle frontal gyrus infarct which occurred in October 2016.   LE venous doppler - pending   PHYSICAL EXAM  Temp:  [97.6 F (36.4 C)-98.1 F (36.7 C)] 97.8 F (36.6 C) (05/17 2314) Pulse Rate:  [41-58] 41 (05/17 2314) Resp:  [10-17] 11 (05/17 2314) BP: (117-136)/(77-93) 136/88 mmHg (05/17 2314) SpO2:  [97 %-100 %] 100 % (05/17 2314)  General - Well nourished, well developed, in no apparent distress.  Ophthalmologic - Fundi not visualized due to eye  movement.  Cardiovascular - Regular rate and rhythm.  Mental Status -  Level of arousal and orientation to time, place, and person were intact. Language including expression, naming, repetition, comprehension was assessed and found intact, however dysarthric which is forced by herself. Fund of Knowledge was assessed and was intact.  Cranial Nerves II - XII - II - Visual field intact OU. III, IV, VI - Extraocular movements intact. V - Facial sensation subjectively absent at right side, significant midline split with tuning fork exam. VII - Facial movement lack of effort and forced asymmetry by  herself. VIII - Hearing & vestibular intact bilaterally. X - Palate elevates symmetrically. XI - Chin turning & shoulder shrug intact bilaterally. XII - Tongue protrusion intact.  Motor Strength - The patient's strength was 3/5 LUE with significant give away weakness and distractable, 2/5 LLE with positive hoover's sign, RUE and RLE 5/5. Bulk was normal and fasciculations were absent.   Motor Tone - Muscle tone was assessed at the neck and appendages and was normal.  Reflexes - The patient's reflexes were 1+ in all extremities and she had no pathological reflexes.  Sensory - Light touch, temperature/pinprick were assessed and were subjectively absent, however positive "yes, no" test.    Coordination - The patient had normal movements in the right hand with no ataxia or dysmetria.  Tremor was absent.  Gait and Station - deferred to PT.   ASSESSMENT/PLAN [name] is a 35 y.o. female with history of multiple strokes, HTN and multiple psychiatric diseases presenting with  worsening left sided weakness and speech abnormalities. She did not receive IV t-PA.   Stroke:  Punctate right temporal lobe infarct, embolic likely secondary to known large PFO.  Resultant giveaway weakness and subjective right sensory loss  CT Left frontal punctate hyperdensity likely artifact  MRI  Punctate R temporal lobe infarct. No hemorrhage seen. Old R frontal gyrus infarct  MRA  Unremarkable   LE venous doppler pending  LDL 97  HgbA1c pending  lovenox for VTE prophylaxis DIET DYS 2 Room service appropriate?: Yes; Fluid consistency:: Thin  clopidogrel 75 mg daily and ASA 81mg  prior to admission, now on ASA 325mg  and  clopidogrel 75 mg daily   Patient counseled to be compliant with her antithrombotic medications  Ongoing aggressive stroke risk factor management  Therapy recommendations:  SNF  Disposition:  Pending  Conversion disorder  Significant give away weakness and lack of  effort  Exam consistent with functional component  Recent argument with housing manager  Will have social worker to help her out  PT/OT  Recurrent embolic stroke  August 2016 - 2 punctate left frontal white matter DWI changes  October 2016 - right MCA infarct with left frontal punctate DWI changes  November 2016 - asymptomatic left frontal punctate DWI changes  Known PFO  per TEE October 2016 admission  PFO closure recommended during Nov 2016 admission  followup arranged with Dr. Excell Seltzerooper - pt was a no show  She missed appointment due to no transport  Will refer again  LE venous doppler pending  Hyperhomocysteinemia - mild  Hypercoagulable workup negative except homocystine level 19.6  Recommend smokes cessation, supplement with B-12, B-6 and folic acid  Hypertension  Stable  Hyperlipidemia  Home meds:  No statin listed. On lipitor 40 in the past  LDL 97, goal < 70  Resumed statin   Continue statin at discharge  Pre Diabetes  HgbA1c pending, goal < 7.0  Controlled  SSI  Tobacco  abuse  Cigarette smoker, advised to stop smoking  Other Stroke Risk Factors  ETOH use  Marijuana use, UDS positive this admission  Family hx stroke (mother)  Migraines, weekly  Other Active Problems  Bipolar  Schizophrenia  Previously a resident at homeless shelter [facility])   Hospital day # 1  Marvel Plan, MD PhD Stroke Neurology 09/26/2015 12:39 AM      To contact Stroke Continuity provider, please refer to [email or web address]. After hours, contact General Neurology

## 2015-09-25 NOTE — Evaluation (Addendum)
Clinical/Bedside Swallow Evaluation Patient Details  Name: Tanya BerlinLatonya Laforte MRN: [id number] Date of Birth: 09/09/1980  Today's Date: 09/25/2015 Time: SLP Start Time (ACUTE ONLY): 0913 SLP Stop Time (ACUTE ONLY): 0935 SLP Time Calculation (min) (ACUTE ONLY): 22 min  Past Medical History:  Past Medical History  Diagnosis Date  . Hypertension   . Heart murmur     "born w/one"  . Hyperlipemia   . Anginal pain (HCC)   . DVT (deep venous thrombosis) (HCC) after 2008    "BLE"  . Bronchial asthma   . Anemia   . GERD (gastroesophageal reflux disease)   . Migraine     "weekly" (03/19/2015)  . Chronic lower back pain   . Bipolar disorder (HCC)   . Schizophrenia (HCC)   . DM (diabetes mellitus) type II uncontrolled, periph vascular disorder (HCC)     notes 01/03/2015  . Seizures (HCC)     epilepsy/notes 02/27/2015  . Stroke (HCC) 04/2014; 09/2014(/notes 04/25/2015);  12/2014; 01/2015; 02/2015    "left side weakness; speech problems since" (03/19/2015)  . Stroke (HCC) 03/19/2015    "left side weakness; speech problems" (03/19/2015)  . PFO (patent foramen ovale)     large, per TEE 03/01/2015   Past Surgical History:  Past Surgical History  Procedure Laterality Date  . 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 Tee without cardioversion N/A 03/01/2015    Procedure: TRANSESOPHAGEAL ECHOCARDIOGRAM (TEE);  Surgeon: Thurmon FairMihai Croitoru, MD;  [facility];  Service: Cardiovascular;  Laterality: N/A;  . Hernia repair    . Umbilical hernia repair  "@ birth"  . Cesarean section  2008  . Tubal ligation  2008   HPI:  35 yo female with multiple psychiatric history and hx of CVA with left sided residual weakness presented with acute onset of worsening left sided weakness and speech. Pt has been seen for previous swallow evaluations with recommendations for regular diet and thin liquids. She has had some mild impairment with short-term memory and mild dysarthria.    Assessment / Plan / Recommendation Clinical  Impression  Pt has reduced movements of her oral musculature bilaterally, which appears improved during oral intake. During oral motor exam she does not open her mouth or move her lips when prompted to do so, stating this is "as far as I can go". Mandibular opening is improved to accept PO boluses administered today, but would recommend Dys 2 diet to facilitate oral acceptance and clearance particularly given lethargy. Thin liquids okay, but would administer medications whole in puree. Will continue to follow and advance.    Aspiration Risk  Mild aspiration risk    Diet Recommendation Dysphagia 2 (Fine chop);Thin liquid   Liquid Administration via: Cup;Straw Medication Administration: Whole meds with puree Supervision: Patient able to self feed;Full supervision/cueing for compensatory strategies Compensations: Minimize environmental distractions;Slow rate;Small sips/bites Postural Changes: Seated upright at 90 degrees;Remain upright for at least 30 minutes after po intake    Other  Recommendations Oral Care Recommendations: Oral care BID   Follow up Recommendations   (tba)    Frequency and Duration min 2x/week  2 weeks       Prognosis Prognosis for Safe Diet Advancement: Good      Swallow Study   General Date of Onset: 09/24/15 HPI: 35 yo female with multiple psychiatric history and hx of CVA with left sided residual weakness presented with acute onset of worsening left sided weakness and speech. Pt has been seen for previous swallow  evaluations with recommendations for regular diet and thin liquids. She has had some mild impairment with short-term memory and mild dysarthria.  Type of Study: Bedside Swallow Evaluation Previous Swallow Assessment: see HPI Diet Prior to this Study: NPO Temperature Spikes Noted: No Respiratory Status: Room air History of Recent Intubation: No Behavior/Cognition: Cooperative;Requires cueing;Lethargic/Drowsy Oral Cavity Assessment: Other (comment)  (limited assessment, mouth does not open much) Oral Care Completed by SLP: No Oral Cavity - Dentition: Adequate natural dentition Vision: Functional for self-feeding (pt reports difficulty with vision but is able to self-feed) Self-Feeding Abilities: Able to feed self;Needs set up Patient Positioning: Upright in bed Baseline Vocal Quality: Other (comment) (monotone) Volitional Cough: Weak (question effort) Volitional Swallow: Able to elicit    Oral/Motor/Sensory Function Overall Oral Motor/Sensory Function: Mild impairment (mild-mod) Facial ROM: Reduced right;Reduced left Facial Symmetry: Within Functional Limits Facial Strength: Reduced right;Reduced left Lingual ROM: Reduced right;Reduced left Lingual Symmetry: Within Functional Limits Lingual Strength: Reduced Mandible: Impaired   Ice Chips Ice chips: Within functional limits Presentation: Spoon   Thin Liquid Thin Liquid: Within functional limits Presentation: Cup;Self Fed;Straw    Nectar Thick Nectar Thick Liquid: Not tested   Honey Thick Honey Thick Liquid: Not tested   Puree Puree: Within functional limits Presentation: Self Fed;Spoon   Solid   GO   Solid: Impaired Presentation: Self Fed Oral Phase Impairments: Other (comment) (reduced manidublar opening)       Maxcine Ham, M.A. CCC-SLP [phone]  Maxcine Ham 09/25/2015,9:51 AM

## 2015-09-25 NOTE — Evaluation (Signed)
Physical Therapy Evaluation Patient Details Name: Tanya Mayer Fehringer MRN: [id number] DOB: 02/15/1981 Today's Date: 09/25/2015   History of Present Illness  Tanya Mayer Knudsen is an 35 yo female with multiple psychiatric history and hx of CVA with left sided residual weakness, PFO, depression, bipolar, schizophrenia and HTN presented with acute onset of worsening left sided weakness and speech  Clinical Impression  Patient presents with decreased independence with mobility due to deficits listed in PT problem list.  She will benefit from skilled PT in the acute setting to address issues and allow return to independent following SNF level rehab stay.    Follow Up Recommendations SNF;Supervision/Assistance - 24 hour    Equipment Recommendations  Wheelchair (measurements PT);Wheelchair cushion (measurements PT)    Recommendations for Other Services       Precautions / Restrictions Precautions Precautions: Fall      Mobility  Bed Mobility Overal bed mobility: Needs Assistance Bed Mobility: Rolling;Sidelying to Sit Rolling: Min assist Sidelying to sit: Mod assist       General bed mobility comments: used rail to roll after L LE flexed over time due to increased tone; pushed up on rail and assist for trunk as well as lowering L leg off bed  Transfers Overall transfer level: Needs assistance Equipment used: 1 person hand held assist Transfers: Sit to/from UGI CorporationStand;Stand Pivot Transfers Sit to Stand: Mod assist Stand pivot transfers: Mod assist       General transfer comment: assist to come upright facilitation through L hip and shoulder; standing weight shifts, then pt c/o light headed so pivot to recliner on R side with mod support for balance and cues lateral weight shifts for steps  Ambulation/Gait                Stairs            Wheelchair Mobility    Modified Rankin (Stroke Patients Only) Modified Rankin (Stroke Patients Only) Pre-Morbid Rankin Score: No  significant disability Modified Rankin: Moderately severe disability     Balance Overall balance assessment: Needs assistance Sitting-balance support: Feet supported Sitting balance-Leahy Scale: Fair Sitting balance - Comments: hands in lap feet on floor     Standing balance-Leahy Scale: Poor Standing balance comment: support for standing static                             Pertinent Vitals/Pain Pain Assessment: Faces Faces Pain Scale: Hurts even more Pain Location: migraine Pain Descriptors / Indicators: Headache Pain Intervention(s): Monitored during session    Home Living Family/patient expects to be discharged to:: Skilled nursing facility Living Arrangements: Alone Available Help at Discharge: Neighbor Type of Home: Apartment Home Access: Stairs to enter Entrance Stairs-Rails: Right Entrance Stairs-Number of Steps: 3 flights Home Layout: One level Home Equipment: Environmental consultantWalker - 4 wheels      Prior Function Level of Independence: Independent               Hand Dominance        Extremity/Trunk Assessment   Upper Extremity Assessment: LUE deficits/detail       LUE Deficits / Details: reports unable to actively move the arm; reports numbness and unable to detect pain or temp; PROM WFL, with approximation at elbow pt able to activate elbow flexors and extensors   Lower Extremity Assessment: LLE deficits/detail   LLE Deficits / Details: extensor tone present, sensation diminished or absent to touch, pain and temp; activates stabilizers in standing, but  unable to activate in non weight bearing position     Communication   Communication: Expressive difficulties (dysarthria)  Cognition Arousal/Alertness: Awake/alert Behavior During Therapy: WFL for tasks assessed/performed Overall Cognitive Status: Within Functional Limits for tasks assessed                      General Comments General comments (skin integrity, edema, etc.): Patient  concerned of the fact she stays on third floor apt and landlord won't give her ground level apt.  Also reports in the past after CVA gone to rehab for 6 months.  Interested in getting PCA help and educated needs to get on wait list.     Exercises        Assessment/Plan    PT Assessment Patient needs continued PT services  PT Diagnosis Hemiplegia non-dominant side;Abnormality of gait   PT Problem List Decreased strength;Decreased activity tolerance;Decreased balance;Decreased mobility;Impaired sensation;Decreased coordination;Impaired tone;Decreased knowledge of use of DME;Decreased safety awareness  PT Treatment Interventions DME instruction;Gait training;Stair training;Functional mobility training;Patient/family education;Therapeutic activities;Therapeutic exercise;Balance training;Wheelchair mobility training   PT Goals (Current goals can be found in the Care Plan section) Acute Rehab PT Goals Patient Stated Goal: To go to rehab PT Goal Formulation: With patient Time For Goal Achievement: 10/09/15 Potential to Achieve Goals: Good    Frequency Min 4X/week   Barriers to discharge Decreased caregiver support      Co-evaluation               End of Session Equipment Utilized During Treatment: Gait belt Activity Tolerance: Patient limited by fatigue Patient left: with call bell/phone within reach;in chair Nurse Communication: Mobility status         Time: 1610-9604 PT Time Calculation (min) (ACUTE ONLY): 29 min   Charges:   PT Evaluation $PT Eval High Complexity: 1 Procedure PT Treatments $Therapeutic Activity: 8-22 mins   PT G CodesElray Mcgregor 10-24-15, 4:12 PM  Sheran Lawless, PT [phone] 2015-10-24

## 2015-09-25 NOTE — Care Management Note (Signed)
Case Management Note  Patient Details  Name: Jackquline BerlinLatonya Carline MRN: [id number] Date of Birth: 11/12/1980  Subjective/Objective:    Patient lives at home alone in an apartment on 3rd floor has 3 flight of stairs to go up, she has had a cva, can not use left side, she has medicaid, await pt/ot eval.  Patient does not have a PCP ,she needs a PCP, will try to get her connected with [facility] clinic, patient [place] and she does not have any transportation, she would like to be set up with SCAT.  NCM made referral to CSW for SCAT, and called DSS to see who is patient's Case Worker which is [place] Oileorgetta Brown  [phone].  Await pt/ot eval.                Action/Plan:   Expected Discharge Date:                  Expected Discharge Plan:  IP Rehab Facility  In-House Referral:     Discharge planning Services     Post Acute Care Choice:    Choice offered to:     DME Arranged:    DME Agency:     HH Arranged:    HH Agency:     Status of Service:  In process, will continue to follow  Medicare Important Message Given:    Date Medicare IM Given:    Medicare IM give by:    Date Additional Medicare IM Given:    Additional Medicare Important Message give by:     If discussed at Long Length of Stay Meetings, dates discussed:    Additional Comments:  Leone Havenaylor, Valjean Ruppel Clinton, RN 09/25/2015, 3:21 PM

## 2015-09-25 NOTE — Clinical Social Work Note (Signed)
[facility] application. Patient is left-handed but unable to use that side of her body due to history of stroke. Due to time constraints today, CSW will assist patient in filling out application tomorrow. RNCM also looking into Medicaid Transportation. RNCM has already called an left a message with patient's DSS social worker.   Charlynn CourtSarah Pihu Basil, CSW [phone]

## 2015-09-26 ENCOUNTER — Inpatient Hospital Stay (HOSPITAL_COMMUNITY): Payer: Medicaid Other

## 2015-09-26 DIAGNOSIS — I639 Cerebral infarction, unspecified: Secondary | ICD-10-CM

## 2015-09-26 DIAGNOSIS — F17200 Nicotine dependence, unspecified, uncomplicated: Secondary | ICD-10-CM

## 2015-09-26 LAB — GLUCOSE, CAPILLARY
GLUCOSE-CAPILLARY: 106 mg/dL — AB (ref 65–99)
GLUCOSE-CAPILLARY: 120 mg/dL — AB (ref 65–99)
Glucose-Capillary: 99 mg/dL (ref 65–99)
Glucose-Capillary: 124 mg/dL — ABNORMAL HIGH (ref 65–99)

## 2015-09-26 LAB — HEMOGLOBIN A1C
HEMOGLOBIN A1C: 5.4 % (ref 4.8–5.6)
Mean Plasma Glucose: 108 mg/dL

## 2015-09-26 MED ORDER — INSULIN ASPART 100 UNIT/ML ~~LOC~~ SOLN
0.0000 [IU] | Freq: Three times a day (TID) | SUBCUTANEOUS | Status: DC
  Administered 2015-09-26 – 2015-09-29 (×4): 1 [IU] via SUBCUTANEOUS
  Administered 2015-09-30: 5 [IU] via SUBCUTANEOUS

## 2015-09-26 MED ORDER — BUTALBITAL-APAP-CAFFEINE 50-325-40 MG PO TABS
1.0000 | ORAL_TABLET | Freq: Two times a day (BID) | ORAL | Status: DC | PRN
  Administered 2015-09-26: 1 via ORAL
  Filled 2015-09-26: qty 1

## 2015-09-26 MED ORDER — CLOPIDOGREL BISULFATE 75 MG PO TABS
75.0000 mg | ORAL_TABLET | Freq: Every day | ORAL | Status: DC
  Administered 2015-09-26 – 2015-09-30 (×5): 75 mg via ORAL
  Filled 2015-09-26 (×5): qty 1

## 2015-09-26 MED ORDER — QUETIAPINE FUMARATE 300 MG PO TABS
300.0000 mg | ORAL_TABLET | Freq: Every day | ORAL | Status: DC
  Administered 2015-09-26 – 2015-09-30 (×3): 300 mg via ORAL
  Filled 2015-09-26: qty 1
  Filled 2015-09-26: qty 6
  Filled 2015-09-26: qty 1
  Filled 2015-09-26 (×2): qty 6

## 2015-09-26 MED ORDER — ENOXAPARIN SODIUM 40 MG/0.4ML ~~LOC~~ SOLN
40.0000 mg | SUBCUTANEOUS | Status: DC
Start: 2015-09-26 — End: 2015-09-30
  Administered 2015-09-26 – 2015-09-30 (×5): 40 mg via SUBCUTANEOUS
  Filled 2015-09-26 (×5): qty 0.4

## 2015-09-26 MED ORDER — DIPHENHYDRAMINE HCL 25 MG PO CAPS
50.0000 mg | ORAL_CAPSULE | Freq: Three times a day (TID) | ORAL | Status: DC | PRN
Start: 2015-09-26 — End: 2015-09-30
  Administered 2015-09-26: 50 mg via ORAL
  Filled 2015-09-26: qty 2

## 2015-09-26 MED ORDER — TRAZODONE HCL 100 MG PO TABS
300.0000 mg | ORAL_TABLET | Freq: Every day | ORAL | Status: DC
  Administered 2015-09-27 – 2015-09-30 (×3): 300 mg via ORAL
  Filled 2015-09-26 (×4): qty 3

## 2015-09-26 NOTE — Clinical Social Work Note (Signed)
Clinical Social Work Assessment  Patient Details  Name: Tanya Mayer MRN: [id number] Date of Birth: 1980/05/30  Date of referral:  09/26/15               Reason for consult:  Facility Placement, Discharge Planning                Permission sought to share information with:  Facility Sport and exercise psychologist, Family Supports Permission granted to share information::  Yes, Verbal Permission Granted  Name::     Tanya Mayer  Agency::  SNF's  Relationship::  Husband  Contact Information:  [phone]  Housing/Transportation Living arrangements for the past 2 months:  Apartment Source of Information:  Patient, Medical Team Patient Interpreter Needed:  None Criminal Activity/Legal Involvement Pertinent to Current Situation/Hospitalization:  No - Comment as needed Significant Relationships:  Spouse, Friend Lives with:  Spouse Do you feel safe going back to the place where you live?  No (Patient lives on the third floor. Trying to move to first floor.) Need for family participation in patient care:  Yes (Comment)  Care giving concerns:  PT recommending SNF at discharge. Also needs transportation resources following discharge.   Social Worker assessment / plan:  CSW met with patient yesterday and dropped off SCAT application for patient to review. Patient is left-handed and had stroke/is paralyzed on left side so CSW told patient that CSW would meet with her today to do application together. No supports at bedside today. CSW provided SNF list. Discuss insurance barriers with 30 or more day stay at SNF with Medicaid and qualifying diagnoses for Medicaid to pay for PT. Patient wanted to do PT in CIR. CSW and RNCM discussed with patient the process of PT recommendations. CSW filled out SCAT application with patient's answers. She was able to write her name on the signature line with her right hand. Patient provided options for SNF. Patient wants Dr. Erlinda Hong to be her PCP. Let RNCM know. Patient also  wants a behavioral health insurance that she was receiving in [place] through BCBS/MCD. Told patient that her Medicaid worker would be able to assist with that. No further concerns. CSW paged Dr. Erlinda Hong about filling out the MD section of SCAT application. Application in the front of patient's chart. CSW encouraged patient to contact CSW as needed. CSW will continue to follow patient for support and facilitate discharge to SNF once medically stable.  Employment status:  Unemployed Forensic scientist:  Medicaid In [place] PT Recommendations:  [place] / Referral to community resources:  [place]  Patient/Family's Response to care:  Patient agreeable to SNF recommendation. Patient reports that her husband is supportive and involved in her care. Patient polite and appreciated social work intervention.  Patient/Family's Understanding of and Emotional Response to Diagnosis, Current Treatment, and Prognosis:  Patient shows an understanding of medical interventions and aware of recommendation for SNF once medically stable for discharge.  Emotional Assessment Appearance:  Appears stated age Attitude/Demeanor/Rapport:   (Pleasant) Affect (typically observed):  Accepting, Appropriate, Calm, Pleasant Orientation:  Oriented to Self, Oriented to Place, Oriented to  Time, Oriented to Situation Alcohol / Substance use:  Tobacco Use, Illicit Drugs Psych involvement (Current and /or in the community):  Outpatient Provider  Discharge Needs  Concerns to be addressed:  Care Coordination, Other (Comment Required (Transportation concerns) Readmission within the last 30 days:  No Current discharge risk:  Dependent with Mobility, Psychiatric Illness, Substance Abuse Barriers to Discharge:  Active Substance Use, Ship broker, Unsafe  home situation   Candie Chroman, LCSW 09/26/2015, 4:11 PM

## 2015-09-26 NOTE — Clinical Social Work Placement (Signed)
   CLINICAL SOCIAL WORK PLACEMENT  NOTE  Date:  09/26/2015  Patient Details  Name: Tanya Mayer MRN: [id number] Date of Birth: 02/22/1981  Clinical Social Work is seeking post-discharge placement for this patient at the Skilled  Nursing Facility level of care (*CSW will initial, date and re-position this form in  chart as items are completed):  Yes   Patient/family provided with [facility] Clinical Social Work Department's list of facilities offering this level of care within the geographic area requested by the patient (or if unable, by the patient's family).  Yes   Patient/family informed of their freedom to choose among providers that offer the needed level of care, that participate in Medicare, Medicaid or managed care program needed by the patient, have an available bed and are willing to accept the patient.  Yes   Patient/family informed of [facility]'s ownership interest in [facility] and [facility], as well as of the fact that they are under no obligation to receive care at these facilities.  PASRR submitted to EDS on 09/26/15     PASRR number received on       Existing PASRR number confirmed on       FL2 transmitted to all facilities in geographic area requested by pt/family on       FL2 transmitted to all facilities within larger geographic area on       Patient informed that his/her managed care company has contracts with or will negotiate with certain facilities, including the following:            Patient/family informed of bed offers received.  Patient chooses bed at       Physician recommends and patient chooses bed at      Patient to be transferred to   on  .  Patient to be transferred to facility by       Patient family notified on   of transfer.  Name of family member notified:        PHYSICIAN       Additional Comment:    _______________________________________________ Margarito LinerSarah C Vincenzo Stave, LCSW 09/26/2015, 4:23 PM

## 2015-09-26 NOTE — Progress Notes (Signed)
Speech Language Pathology Treatment: Dysphagia  Patient Details Name: Tanya Mayer MRN: [id number] DOB: 02/05/1981 Today's Date: 09/26/2015 Time: [id number] SLP Time Calculation (min) (ACUTE ONLY): 10 min  Assessment / Plan / Recommendation Clinical Impression  Pt seen for dysphagia followup. Pt reports she is tired of the chopped foods and wants some "real" food. Pt demonstrating oral opening WFL and fluent speech prior to oral mech. However, during oral mech, pt demonstrated decreased ROM in all directions. Mild L labial weakness upon retraction. Pt tolerated all trials WNL- ate the wntire cracker in one bite. Speech became more telegraphic at the conclusion of the session. Will upgrade to regular diet and follow intermittently.   HPI HPI: 35 yo female with multiple psychiatric history and hx of CVA with left sided residual weakness presented with acute onset of worsening left sided weakness and speech. Pt has been seen for previous swallow evaluations with recommendations for regular diet and thin liquids. She has had some mild impairment with short-term memory and mild dysarthria.       SLP Plan  Continue with current plan of care     Recommendations  Diet recommendations: Regular;Thin liquid Liquids provided via: Cup;Straw Medication Administration: Whole meds with liquid Supervision: Patient able to self feed Compensations: Minimize environmental distractions Postural Changes and/or Swallow Maneuvers: Out of bed for meals;Seated upright 90 degrees             Oral Care Recommendations: Oral care BID Follow up Recommendations:  (TBA) Plan: Continue with current plan of care     GO                Tanya Mayer 09/26/2015, 12:33 PM

## 2015-09-26 NOTE — Progress Notes (Signed)
OT Cancellation Note  Patient Details Name: Jackquline BerlinLatonya Aguinaldo MRN: [id number] DOB: 06/05/1980   Cancelled Treatment:    Reason Eval/Treat Not Completed: Pt with other provider.  Will reattempt.   Angelene GiovanniConarpe, Deborrah Mabin M  Detrice Cales [place], OTR/L [phone]  09/26/2015, 12:23 PM

## 2015-09-26 NOTE — Progress Notes (Signed)
STROKE TEAM PROGRESS NOTE   SUBJECTIVE (INTERVAL HISTORY) Her RN is at the bedside.  Pt no more slurry speech, very interactive but still stated not able to move RUE and RLE. However, observed good movement with distraction.     OBJECTIVE Temp:  [97.3 F (36.3 C)-97.9 F (36.6 C)] 97.3 F (36.3 C) (05/18 1143) Pulse Rate:  [41-69] 69 (05/18 0754) Cardiac Rhythm:  [-] Sinus bradycardia (05/18 0754) Resp:  [10-13] 13 (05/18 0754) BP: (98-136)/(55-88) 99/64 mmHg (05/18 0754) SpO2:  [96 %-100 %] 98 % (05/18 0754)  CBC:   Recent Labs Lab 09/24/15 2210 09/24/15 2224  WBC 4.5  --   NEUTROABS 2.3  --   HGB 12.3 13.6  HCT 37.2 40.0  MCV 88.4  --   PLT 208  --     Basic Metabolic Panel:   Recent Labs Lab 09/24/15 2210 09/24/15 2224  NA 137 139  K 3.6 3.5  CL 105 104  CO2 24  --   GLUCOSE 135* 125*  BUN 10 10  CREATININE 1.24* 1.20*  CALCIUM 8.8*  --     Lipid Panel:     Component Value Date/Time   CHOL 154 09/25/2015 0638   TRIG 101 09/25/2015 0638   HDL 37* 09/25/2015 0638   CHOLHDL 4.2 09/25/2015 0638   VLDL 20 09/25/2015 0638   LDLCALC 97 09/25/2015 0638   HgbA1c:  Lab Results  Component Value Date   HGBA1C 5.4 09/25/2015   Urine Drug Screen:     Component Value Date/Time   LABOPIA NONE DETECTED 09/25/2015 0645   COCAINSCRNUR NONE DETECTED 09/25/2015 0645   LABBENZ NONE DETECTED 09/25/2015 0645   AMPHETMU NONE DETECTED 09/25/2015 0645   THCU POSITIVE* 09/25/2015 0645   LABBARB NONE DETECTED 09/25/2015 0645      IMAGING I have personally reviewed the radiological images below and agree with the radiology interpretations.  Ct Head Wo Contrast 09/24/2015  Tiny high attenuation focus LEFT frontal lobe, cannot exclude focus of acute punctate hemorrhage. Remainder of exam unremarkable.   Mri & Mra Brain Wo Contrast 09/25/2015  1. Punctate acute infarct in the posterior right temporal lobe. No mass effect or hemorrhage. 2. Stable and normal  intracranial MRA. 3. No finding to correspond to the punctate hyperdensity in the anterior left frontal lobe questioned on CT, and I favor that was artifact. 4. Expected encephalomalacia at the site of the right middle frontal gyrus infarct which occurred in October 2016.   LE venous doppler - Bilateral: No evidence of DVT, superficial thrombosis, or Baker's Cyst.   PHYSICAL EXAM  Temp:  [97.3 F (36.3 C)-97.9 F (36.6 C)] 97.3 F (36.3 C) (05/18 1143) Pulse Rate:  [41-69] 69 (05/18 0754) Resp:  [10-13] 13 (05/18 0754) BP: (98-136)/(55-88) 99/64 mmHg (05/18 0754) SpO2:  [96 %-100 %] 98 % (05/18 0754)  General - Well nourished, well developed, in no apparent distress.  Ophthalmologic - Fundi not visualized due to eye movement.  Cardiovascular - Regular rate and rhythm.  Mental Status -  Level of arousal and orientation to time, place, and person were intact. Language including expression, naming, repetition, comprehension was assessed and found intact. Fund of Knowledge was assessed and was intact.  Cranial Nerves II - XII - II - Visual field intact OU. III, IV, VI - Extraocular movements intact. V - Facial sensation subjectively absent at right side, significant midline split with tuning fork exam. VII - Facial movement lack of effort and forced asymmetry by herself.  VIII - Hearing & vestibular intact bilaterally. X - Palate elevates symmetrically. XI - Chin turning & shoulder shrug intact bilaterally. XII - Tongue protrusion intact.  Motor Strength - The patient's strength was 3/5 LUE with significant give away weakness and distractable, 2/5 LLE with positive hoover's sign, RUE and RLE 5/5. Bulk was normal and fasciculations were absent.   Motor Tone - Muscle tone was assessed at the neck and appendages and was normal.  Reflexes - The patient's reflexes were 1+ in all extremities and she had no pathological reflexes.  Sensory - Light touch, temperature/pinprick were  assessed and were subjectively absent, however positive "yes, no" test.    Coordination - The patient had normal movements in the right hand with no ataxia or dysmetria.  Tremor was absent.  Gait and Station - deferred to PT.   ASSESSMENT/PLAN Tanya Mayer is a 35 y.o. female with history of multiple strokes, HTN and multiple psychiatric diseases presenting with  worsening left sided weakness and speech abnormalities. She did not receive IV t-PA.   Stroke:  Punctate right temporal lobe infarct, embolic likely secondary to known large PFO.  Resultant giveaway weakness and subjective right sensory loss  CT Left frontal punctate hyperdensity likely artifact  MRI  Punctate R temporal lobe infarct. No hemorrhage seen. Old R frontal gyrus infarct  MRA  Unremarkable   LE venous doppler pending  LDL 97  HgbA1c 5.7  lovenox for VTE prophylaxis Diet regular Room service appropriate?: Yes; Fluid consistency:: Thin  clopidogrel 75 mg daily and ASA 81mg  prior to admission, now on ASA 325mg  and  clopidogrel 75 mg daily   Patient counseled to be compliant with her antithrombotic medications  Ongoing aggressive stroke risk factor management  Therapy recommendations:  SNF  Disposition:  Pending  Conversion disorder  Significant give away weakness and lack of effort  Exam consistent with functional component  Recent argument with housing manager  Will have social worker to help her out  PT/OT  Recurrent embolic stroke  August 2016 - 2 punctate left frontal white matter DWI changes  October 2016 - right MCA infarct with left frontal punctate DWI changes  November 2016 - asymptomatic left frontal punctate DWI changes  Known PFO  per TEE October 2016 admission  PFO closure recommended during Nov 2016 admission  followup arranged with Dr. Excell Seltzerooper - pt was a no show  She missed appointment due to no transport  Will refer to Dr. Excell Seltzerooper again, however, she could be  noncompliance  ROPE score = 6  LE venous doppler negative  Hypertension  Stable  Hyperlipidemia  Home meds:  No statin listed. On lipitor 40 in the past  LDL 97, goal < 70  Resumed statin   Continue statin at discharge  Tobacco abuse  Cigarette smoker, advised to stop smoking  Other Stroke Risk Factors  ETOH use  Marijuana use, UDS positive this admission  Family hx stroke (mother)  Migraines, weekly  Other Active Problems  Bipolar  Schizophrenia  Previously a resident at homeless shelter [facility]   Hospital day # 2  Tanya PlanJindong Chenae Brager, MD PhD Stroke Neurology 09/26/2015 2:07 PM      To contact Stroke Continuity provider, please refer to [email or web address]. After hours, contact General Neurology

## 2015-09-26 NOTE — Progress Notes (Signed)
VASCULAR LAB PRELIMINARY  PRELIMINARY  PRELIMINARY  PRELIMINARY  Bilateral lower extremity venous duplex  completed.    Preliminary report:  Bilateral:  No evidence of DVT, superficial thrombosis, or Baker's Cyst.   Shed Nixon, RVT 09/26/2015, 10:17 AM

## 2015-09-26 NOTE — Progress Notes (Signed)
Physical Therapy Treatment Patient Details Name: Tanya Mayer MRN: [id number] DOB: [birth year] Today's Date: 09/26/2015    History of Present Illness Tanya Mayer is an 35 yo female with multiple psychiatric history and hx of CVA with left sided residual weakness, PFO, depression, bipolar, schizophrenia and HTN presented with acute onset of worsening left sided weakness and speech    PT Comments    Patient progressing to ambulating in room.  Feel she will continue to improve with therapy.  Continue to recommend SNF level rehab at d/c.  Follow Up Recommendations  SNF;Supervision/Assistance - 24 hour     Equipment Recommendations  Other (comment) (TBA at next venue)    Recommendations for Other Services       Precautions / Restrictions Precautions Precautions: Fall    Mobility  Bed Mobility Overal bed mobility: Needs Assistance     Sidelying to sit: Min assist       General bed mobility comments: assist for L leg out of bed; increased time, cues and heavy use of rail  Transfers Overall transfer level: Needs assistance Equipment used: Rolling walker (2 wheeled) Transfers: Sit to/from Stand Sit to Stand: Mod assist         General transfer comment: assist for lifting and getting into trunk extension; assist for placement of L foot and L hand on walker  Ambulation/Gait Ambulation/Gait assistance: Mod assist Ambulation Distance (Feet): 50 Feet Assistive device: Rolling walker (2 wheeled) Gait Pattern/deviations: Step-through pattern;Step-to pattern;Decreased stride length;Decreased dorsiflexion - left;Shuffle     General Gait Details: assist to move L foot and pt reports unable to pick up; assist for walker management   Stairs            Wheelchair Mobility    Modified Rankin (Stroke Patients Only) Modified Rankin (Stroke Patients Only) Pre-Morbid Rankin Score: No significant disability Modified Rankin: Moderately severe disability     Balance  Overall balance assessment: Needs assistance         Standing balance support: Single extremity supported;During functional activity Standing balance-Leahy Scale: Poor Standing balance comment: minguard for balance with L hand on walker while brushing teeth at sink                    Cognition Arousal/Alertness: Awake/alert Behavior During Therapy: WFL for tasks assessed/performed Overall Cognitive Status: Within Functional Limits for tasks assessed                      Exercises      General Comments General comments (skin integrity, edema, etc.): reports SW to get her a power chair; discussed she is young and will recover from this stroke and that equipment not usually gotten until d/c home from rehab so likely will be ambulatory at that point      Pertinent Vitals/Pain Pain Assessment: Faces Faces Pain Scale: No hurt    Home Living                      Prior Function            PT Goals (current goals can now be found in the care plan section) Progress towards PT goals: Progressing toward goals    Frequency  Min 4X/week    PT Plan Current plan remains appropriate    Co-evaluation             End of Session Equipment Utilized During Treatment: Gait belt Activity Tolerance: Patient tolerated treatment well Patient left:  in chair;with call bell/phone within reach     Time: 1040-1108 PT Time Calculation (min) (ACUTE ONLY): 28 min  Charges:  $Gait Training: 23-37 mins                    G Codes:      Elray McgregorCynthia Wrangler Penning 09/26/2015, 1:05 PM  Sheran Lawlessyndi Cambri [place], [place] CarolinaPT [phone] 09/26/2015

## 2015-09-26 NOTE — Evaluation (Signed)
Occupational Therapy Evaluation Patient Details Name: Tanya Mayer MRN: [id number] DOB: 08/14/1980 Today's Date: 09/26/2015    History of Present Illness Tanya Mayer is an 35 yo female with multiple psychiatric history and hx of CVA with left sided residual weakness, PFO, depression, bipolar, schizophrenia and HTN presented with acute onset of worsening left sided weakness and speech   Clinical Impression   Pt admitted with above. She demonstrates the below listed deficits and will benefit from continued OT to maximize safety and independence with BADLs.  Pt presents to OT with Lt hemiparesis, however, many inconsistencies noted.   She is familiar to this OT from previous admission and at that time had very similar presentation.  Anticipate she will progress to modified independence, but given that she lives on the third floor, anticipate she will need short term rehab at SNF prior to discharge home.  Will follow acutely.       Follow Up Recommendations  SNF;Supervision/Assistance - 24 hour    Equipment Recommendations  None recommended by OT    Recommendations for Other Services       Precautions / Restrictions Precautions Precautions: Fall      Mobility Bed Mobility Overal bed mobility: Needs Assistance     Sidelying to sit: Min assist       General bed mobility comments: assist for L leg out of bed; increased time, cues and heavy use of rail  Transfers Overall transfer level: Needs assistance Equipment used: Rolling walker (2 wheeled) Transfers: Sit to/from [employer]GI CorporationStand;Stand Pivot Transfers Sit to Stand: Min assist Stand pivot transfers: Min assist       General transfer comment: Pt requires min A to to steady and move into standing.  On second stand. She stood with min guard assist.  She is very slow to extend trunk and hips.  During transfer, pt bends forrward and picks up Lt LE to advance it, while maintaining Lt UE on walker, then stands fullly upright and  steps with Rt foot.  She repeated this several times with no LOB     Balance Overall balance assessment: Needs assistance Sitting-balance support: Feet supported Sitting balance-Leahy Scale: Good     Standing balance support: Single extremity supported Standing balance-Leahy Scale: Poor Standing balance comment: requires UE support                             ADL Overall ADL's : Needs assistance/impaired Eating/Feeding: Modified independent;Sitting   Grooming: Wash/dry hands;Wash/dry face;Oral care;Brushing hair;Set up;Sitting   Upper Body Bathing: Minimal assitance;Sitting   Lower Body Bathing: Minimal assistance;Sit to/from stand   Upper Body Dressing : Minimal assistance;Sitting   Lower Body Dressing: Minimal assistance;Sit to/from stand   Toilet Transfer: Minimal assistance;Stand-pivot;BSC;RW   Toileting- Clothing Manipulation and Hygiene: Minimal assistance;Sit to/from stand       Functional mobility during ADLs: Minimal assistance;Rolling walker       Vision Vision Assessment?: No apparent visual deficits Additional Comments: Pt able to use and read cell phone without difficulty, and is able to locate all items in room without difficulty    Perception Perception Perception Tested?: Yes   Praxis Praxis Praxis tested?: Within functional limits    Pertinent Vitals/Pain Pain Assessment: No/denies pain Faces Pain Scale: No hurt     Hand Dominance Left   Extremity/Trunk Assessment Upper Extremity Assessment Upper Extremity Assessment: LUE deficits/detail LUE Deficits / Details: Pt reports that she is unable to move Lt UE.  She  initially denies residual weakness of that arm after last CVA, but later states that she could only move it minimally.   She does not make attempt to move it when asked, only states 'I can't move it".  She was clearly assisting with PROM all muscle groups as therapist was able to range her using one finger.  Pt noted to lift  Lt UE onto walker, and she tends to initiate movement, then stop, holding it in midair without dropping it.  LUE Sensation: decreased light touch;decreased proprioception (per pt report )   Lower Extremity Assessment Lower Extremity Assessment: Defer to PT evaluation   Cervical / Trunk Assessment Cervical / Trunk Assessment: Normal   Communication Communication Communication: No difficulties   Cognition Arousal/Alertness: Awake/alert Behavior During Therapy: WFL for tasks assessed/performed Overall Cognitive Status: Within Functional Limits for tasks assessed                     General Comments       Exercises       Shoulder Instructions      Home Living Family/patient expects to be discharged to:: Skilled nursing facility Living Arrangements: Alone Available Help at Discharge: Neighbor Type of Home: Apartment Home Access: Stairs to enter [employer] of Steps: 3 flights Entrance Stairs-Rails: Right Home Layout: One level               Home Equipment: Walker - 4 wheels          Prior Functioning/Environment Level of Independence: Independent        Comments: PTA using RW for ambulation w/o assist or supervision    OT Diagnosis: Generalized weakness   OT Problem List: Decreased strength;Decreased range of motion;Decreased activity tolerance;Impaired balance (sitting and/or standing);Decreased coordination;Decreased safety awareness;Decreased knowledge of use of DME or AE;Impaired sensation;Impaired UE functional use   OT Treatment/Interventions:      OT Goals(Current goals can be found in the care plan section) Acute Rehab OT Goals Patient Stated Goal: To go to rehab OT Goal Formulation: With patient Time For Goal Achievement: 10/10/15 Potential to Achieve Goals: Good ADL Goals Pt Will Perform Grooming: with modified independence;standing Pt Will Perform Upper Body Bathing: with modified independence;sitting;standing Pt Will  Perform Lower Body Bathing: with modified independence;sit to/from stand Pt Will Perform Upper Body Dressing: with modified independence;sitting Pt Will Perform Lower Body Dressing: with modified independence;sit to/from stand Pt Will Transfer to Toilet: with modified independence;ambulating;regular height toilet;bedside commode Pt Will Perform Toileting - Clothing Manipulation and hygiene: sit to/from stand;with modified independence  OT Frequency:     Barriers to D/C:            Co-evaluation              End of Session Equipment Utilized During Treatment: Gait belt;Rolling walker Nurse Communication: Mobility status  Activity Tolerance: Patient tolerated treatment well Patient left: in chair;with call bell/phone within reach   Time: 2130-8657 OT Time Calculation (min): 25 min Charges:  OT General Charges $OT Visit: 1 Procedure OT Evaluation $OT Eval Moderate Complexity: 1 Procedure OT Treatments $Therapeutic Activity: 8-22 mins G-Codes:    Terrance Usery M 2015/10/03, 1:52 PM

## 2015-09-27 LAB — GLUCOSE, CAPILLARY
GLUCOSE-CAPILLARY: 93 mg/dL (ref 65–99)
GLUCOSE-CAPILLARY: 99 mg/dL (ref 65–99)
Glucose-Capillary: 91 mg/dL (ref 65–99)
Glucose-Capillary: 73 mg/dL (ref 65–99)

## 2015-09-27 LAB — CBC
HEMATOCRIT: 38.9 % (ref 36.0–46.0)
HEMOGLOBIN: 12.1 g/dL (ref 12.0–15.0)
MCH: 28.5 pg (ref 26.0–34.0)
MCHC: 31.1 g/dL (ref 30.0–36.0)
MCV: 91.7 fL (ref 78.0–100.0)
Platelets: 188 10*3/uL (ref 150–400)
RBC: 4.24 MIL/uL (ref 3.87–5.11)
RDW: 13.2 % (ref 11.5–15.5)
WBC: 3.9 10*3/uL — AB (ref 4.0–10.5)

## 2015-09-27 LAB — BASIC METABOLIC PANEL
Anion gap: 8 (ref 5–15)
BUN: 7 mg/dL (ref 6–20)
CALCIUM: 9.1 mg/dL (ref 8.9–10.3)
CHLORIDE: 103 mmol/L (ref 101–111)
CO2: 28 mmol/L (ref 22–32)
CREATININE: 1.33 mg/dL — AB (ref 0.44–1.00)
GFR calc Af Amer: 59 mL/min — ABNORMAL LOW (ref >60)
GFR calc non Af Amer: 51 mL/min — ABNORMAL LOW (ref >60)
GLUCOSE: 105 mg/dL — AB (ref 65–99)
Potassium: 4.2 mmol/L (ref 3.5–5.1)
Sodium: 139 mmol/L (ref 135–145)

## 2015-09-27 NOTE — Progress Notes (Signed)
STROKE TEAM PROGRESS NOTE   SUBJECTIVE (INTERVAL HISTORY) Her case manager is at the bedside.  Pt today again with slurry speech, and not move left arms or legs. However, when she is anxious to find her cell phone, she moves left arm and leg without problem. She is asking two entrees for lunch because she did not get breakfast as she ordered. She refused to pick up a SNF as she can not smoke over there.      OBJECTIVE Temp:  [97.6 F (36.4 C)-98.6 F (37 C)] 97.6 F (36.4 C) (05/19 1017) Pulse Rate:  [59-75] 68 (05/19 1017) Cardiac Rhythm:  [-] Normal sinus rhythm (05/19 0700) Resp:  [14-16] 16 (05/19 1017) BP: (97-115)/(62-82) 110/82 mmHg (05/19 1017) SpO2:  [97 %-100 %] 100 % (05/19 1017)  CBC:   Recent Labs Lab 09/24/15 2210 09/24/15 2224 09/27/15 0605  WBC 4.5  --  3.9*  NEUTROABS 2.3  --   --   HGB 12.3 13.6 12.1  HCT 37.2 40.0 38.9  MCV 88.4  --  91.7  PLT 208  --  188    Basic Metabolic Panel:   Recent Labs Lab 09/24/15 2210 09/24/15 2224 09/27/15 0605  NA 137 139 139  K 3.6 3.5 4.2  CL 105 104 103  CO2 24  --  28  GLUCOSE 135* 125* 105*  BUN 10 10 7   CREATININE 1.24* 1.20* 1.33*  CALCIUM 8.8*  --  9.1    Lipid Panel:     Component Value Date/Time   CHOL 154 09/25/2015 0638   TRIG 101 09/25/2015 0638   HDL 37* 09/25/2015 0638   CHOLHDL 4.2 09/25/2015 0638   VLDL 20 09/25/2015 0638   LDLCALC 97 09/25/2015 0638   HgbA1c:  Lab Results  Component Value Date   HGBA1C 5.4 09/25/2015   Urine Drug Screen:     Component Value Date/Time   LABOPIA NONE DETECTED 09/25/2015 0645   COCAINSCRNUR NONE DETECTED 09/25/2015 0645   LABBENZ NONE DETECTED 09/25/2015 0645   AMPHETMU NONE DETECTED 09/25/2015 0645   THCU POSITIVE* 09/25/2015 0645   LABBARB NONE DETECTED 09/25/2015 0645      IMAGING I have personally reviewed the radiological images below and agree with the radiology interpretations.  Ct Head Wo Contrast 09/24/2015  Tiny high attenuation  focus LEFT frontal lobe, cannot exclude focus of acute punctate hemorrhage. Remainder of exam unremarkable.   Mri & Mra Brain Wo Contrast 09/25/2015  1. Punctate acute infarct in the posterior right temporal lobe. No mass effect or hemorrhage. 2. Stable and normal intracranial MRA. 3. No finding to correspond to the punctate hyperdensity in the anterior left frontal lobe questioned on CT, and I favor that was artifact. 4. Expected encephalomalacia at the site of the right middle frontal gyrus infarct which occurred in October 2016.   LE venous doppler - Bilateral: No evidence of DVT, superficial thrombosis, or Baker's Cyst.   PHYSICAL EXAM  Temp:  [97.6 F (36.4 C)-98.6 F (37 C)] 97.6 F (36.4 C) (05/19 1017) Pulse Rate:  [59-75] 68 (05/19 1017) Resp:  [14-16] 16 (05/19 1017) BP: (97-115)/(62-82) 110/82 mmHg (05/19 1017) SpO2:  [97 %-100 %] 100 % (05/19 1017)  General - Well nourished, well developed, in no apparent distress.  Ophthalmologic - Fundi not visualized due to eye movement.  Cardiovascular - Regular rate and rhythm.  Mental Status -  Level of arousal and orientation to time, place, and person were intact. Language including expression, naming, repetition, comprehension  was assessed and found intact. Fund of Knowledge was assessed and was intact.  Cranial Nerves II - XII - II - Visual field intact OU. III, IV, VI - Extraocular movements intact. V - Facial sensation subjectively absent at right side, significant midline split with tuning fork exam. VII - Facial movement lack of effort and forced asymmetry by herself. VIII - Hearing & vestibular intact bilaterally. X - Palate elevates symmetrically. XI - Chin turning & shoulder shrug intact bilaterally. XII - Tongue protrusion intact.  Motor Strength - The patient's strength was 3/5 LUE with significant give away weakness and distractable, 2/5 LLE with positive hoover's sign, RUE and RLE 5/5. Bulk was normal and  fasciculations were absent.   Motor Tone - Muscle tone was assessed at the neck and appendages and was normal.  Reflexes - The patient's reflexes were 1+ in all extremities and she had no pathological reflexes.  Sensory - Light touch, temperature/pinprick were assessed and were subjectively absent.  Coordination - The patient had normal movements in the right hand with no ataxia or dysmetria.  Tremor was absent.  Gait and Station - deferred to PT.   ASSESSMENT/PLAN Tanya Mayer is a 35 y.o. female with history of multiple strokes, HTN and multiple psychiatric diseases presenting with  worsening left sided weakness and speech abnormalities. She did not receive IV t-PA.   Stroke:  Punctate right temporal lobe infarct, embolic likely secondary to known large PFO.  Resultant giveaway weakness and subjective right sensory loss  CT Left frontal punctate hyperdensity likely artifact  MRI  Punctate R temporal lobe infarct. No hemorrhage seen. Old R frontal gyrus infarct  MRA  Unremarkable   LE venous doppler negative for DVT  LDL 97  HgbA1c 5.7  lovenox for VTE prophylaxis Diet regular Room service appropriate?: Yes; Fluid consistency:: Thin  clopidogrel 75 mg daily and ASA  prior to admission, now on ASA  and  clopidogrel 75 mg daily   Patient counseled to be compliant with her antithrombotic medications  Ongoing aggressive stroke risk factor management  Therapy recommendations:  SNF  Disposition:  Pending  Conversion disorder  Significant give away weakness and lack of effort  Exam consistent with functional component  Recent argument with housing manager  PT/OT  Recurrent embolic stroke  August 2016 - 2 punctate left frontal white matter DWI changes  October 2016 - right MCA infarct with left frontal punctate DWI changes  November 2016 - asymptomatic left frontal punctate DWI changes  Known PFO  per TEE October 2016 admission  PFO closure  recommended during Nov 2016 admission  followup arranged with Dr. Excell Seltzer - pt was a no show  She missed appointment due to no transport  Will refer to Dr. Excell Seltzer again, however, she could be noncompliance  ROPE score = 6  LE venous doppler negative  Hypertension  Stable  Hyperlipidemia  Home meds:  No statin listed. On lipitor 40 in the past  LDL 97, goal < 70  Resumed statin   Continue statin at discharge  Tobacco abuse  Cigarette smoker, advised to stop smoking  Pt is not ready for smoke cessation yet  Did not pick up SNF due to not able to smoke over there  Other Stroke Risk Factors  ETOH use  Marijuana use, UDS positive this admission  Family hx stroke (mother)  Migraines, weekly  Other Active Problems  Bipolar  Schizophrenia  Previously a resident at homeless shelter [facility])  Hospital day # 3  Marvel Plan, MD PhD Stroke Neurology 09/27/2015 4:57 PM    To contact Stroke Continuity provider, please refer to [email or web address]. After hours, contact General Neurology

## 2015-09-27 NOTE — Progress Notes (Signed)
CM and Dr Erlinda Hong met with patient at bedside to discuss discharge planning. Per CSW, patient has been reluctant to make decisions regarding her discharge disposition.  CM and Dr Erlinda Hong discussed rehab venues in detail. Patient was presented two bed offers by CSW.  She was very concerned about SNF locations in respect to local bus routes.  CM explained the Medicaid 30 day stay rule and patient verbalized understanding.  She chose [facility]. CSW was notified.  Lorne Skeens RN, MSN [phone]

## 2015-09-27 NOTE — NC FL2 (Signed)
[place] MEDICAID FL2 LEVEL OF CARE SCREENING TOOL     IDENTIFICATION  Patient Name: Tanya Mayer Birthdate: Dec 02, 1980 Sex: female Admission Date (Current Location): 09/24/2015  [facility] and [place] Number:  Producer, television/film/video and Address:  [facility], [address], [place], [place] [address]      Provider Number: [id number]  Attending Physician Name and Address:  Marvel Plan, MD  Relative Name and Phone Number:  Leonette Most, spouse, [phone]    Current Level of Care: Hospital Recommended Level of Care: Skilled Nursing Facility Prior Approval Number:    Date Approved/Denied:   PASRR Number:    Discharge Plan: SNF    Current Diagnoses: Patient Active Problem List   Diagnosis Date Noted  . ICH (intracerebral hemorrhage) (HCC) 09/24/2015  . Anaphylaxis 07/01/2015  . Presence of IVC filter   . Essential hypertension, benign   . Pain in the chest   . Pain   . HLD (hyperlipidemia)   . CVA (cerebral infarction) 03/19/2015  . Chest pain   . Cerebral infarction due to unspecified mechanism   . UTI (lower urinary tract infection)   . Cryptogenic stroke (HCC)   . Type 2 diabetes mellitus with diabetic neuropathy, with long-term current use of insulin (HCC)   . Acute thromboembolic cerebrovascular accident (CVA) (HCC)   . PFO (patent foramen ovale)   . 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 Schizophrenia (HCC)   . Bipolar I disorder (HCC)   . Seizure disorder (HCC)   . Acute encephalopathy 02/27/2015  . Stroke (HCC) 01/03/2015  . Essential hypertension 01/03/2015  . DM (diabetes mellitus) type II controlled, neurological manifestation (HCC) 01/03/2015  . Hyperlipidemia 01/03/2015  . Hemiparesis affecting left side as late effect of cerebrovascular accident (HCC) 01/03/2015  . Tobacco abuse 01/03/2015  . Marijuana abuse 01/03/2015  . Asthma 01/03/2015  . History of DVT of lower extremity  01/03/2015    Orientation RESPIRATION BLADDER Height & Weight     Self, Time, Situation, Place  Normal Continent Weight:   Height:  5' (152.4 cm)  BEHAVIORAL SYMPTOMS/MOOD NEUROLOGICAL BOWEL NUTRITION STATUS      Continent Diet (Please see DC summary)  AMBULATORY STATUS COMMUNICATION OF NEEDS Skin   Limited Assist Verbally Normal                       Personal Care Assistance Level of Assistance  Bathing, Feeding, Dressing Bathing Assistance: Limited assistance Feeding assistance: Independent Dressing Assistance: Limited assistance     Functional Limitations Info             SPECIAL CARE FACTORS FREQUENCY  PT (By licensed PT), OT (By licensed OT)     PT Frequency: min 4x/week OT Frequency: min 2x/week            Contractures      Additional Factors Info  Code Status, Allergies, Psychotropic, Insulin Sliding Scale Code Status Info: Full Allergies Info: Carrot, Eggs Or Egg-derived Products, Mushroom Extract Complex Psychotropic Info: Seroquel; Trazodone; risperiDONE (RISPERDAL) tablet 0.25 mg Insulin Sliding Scale Info: insulin aspart (novoLOG) injection 0-9 Units       Current Medications (09/27/2015):  This is the current hospital active medication list Current Facility-Administered Medications  Medication Dose Route Frequency Provider Last Rate Last Dose  . acetaminophen (TYLENOL) tablet 650 mg  650 mg Oral Q4H PRN Lenise Herald, MD   650 mg at 09/25/15 1945  . ALPRAZolam (  XANAX) tablet 1 mg  1 mg Oral BID PRN Layne BentonSharon L Biby, NP      . aspirin EC tablet 325 mg  325 mg Oral Daily Marvel PlanJindong Xu, MD   325 mg at 09/26/15 1022  . atorvastatin (LIPITOR) tablet 40 mg  40 mg Oral q1800 Layne BentonSharon L Biby, NP   40 mg at 09/26/15 1825  . butalbital-acetaminophen-caffeine (FIORICET, ESGIC) 50-325-40 MG per tablet 1 tablet  1 tablet Oral Q12H PRN Marvel PlanJindong Xu, MD   1 tablet at 09/26/15 1825  . clopidogrel (PLAVIX) tablet 75 mg  75 mg Oral Daily Marvel PlanJindong Xu, MD   75 mg at  09/26/15 1023  . diphenhydrAMINE (BENADRYL) capsule 50 mg  50 mg Oral Q8H PRN Ram Daniel NonesNarayan Kaveer Nandigam, MD   50 mg at 09/26/15 1858  . enoxaparin (LOVENOX) injection 40 mg  40 mg Subcutaneous Q24H Marvel PlanJindong Xu, MD   40 mg at 09/26/15 1024  . gabapentin (NEURONTIN) capsule 900 mg  900 mg Oral TID Layne BentonSharon L Biby, NP   900 mg at 09/26/15 2152  . insulin aspart (novoLOG) injection 0-9 Units  0-9 Units Subcutaneous TID WC Marvel PlanJindong Xu, MD   1 Units at 09/26/15 1829  . labetalol (NORMODYNE,TRANDATE) injection 10-40 mg  10-40 mg Intravenous Q10 min PRN Lenise HeraldAleksandr Shikhman, MD      . oxyCODONE (Oxy IR/ROXICODONE) immediate release tablet 30 mg  30 mg Oral Q6H PRN Layne BentonSharon L Biby, NP   30 mg at 09/26/15 1442  . pantoprazole (PROTONIX) EC tablet 40 mg  40 mg Oral QHS Bertram MillardMichael A Maccia, RPH   40 mg at 09/26/15 2154  . QUEtiapine (SEROQUEL) tablet 300 mg  300 mg Oral QHS Marvel PlanJindong Xu, MD   300 mg at 09/26/15 2155  . risperiDONE (RISPERDAL) tablet 0.25 mg  0.25 mg Oral BID Layne BentonSharon L Biby, NP   0.25 mg at 09/26/15 2153  . senna-docusate (Senokot-S) tablet 1 tablet  1 tablet Oral BID Lenise HeraldAleksandr Shikhman, MD   1 tablet at 09/26/15 2154  . traZODone (DESYREL) tablet 300 mg  300 mg Oral QHS Marvel PlanJindong Xu, MD         Discharge Medications: Please see discharge summary for a list of discharge medications.  Relevant Imaging Results:  Relevant Lab Results:   Additional Information SSN: [id number]  Autymn Omlor S PeoriaRayyan, [place]CSWA

## 2015-09-28 LAB — GLUCOSE, CAPILLARY
GLUCOSE-CAPILLARY: 109 mg/dL — AB (ref 65–99)
GLUCOSE-CAPILLARY: 125 mg/dL — AB (ref 65–99)
Glucose-Capillary: 114 mg/dL — ABNORMAL HIGH (ref 65–99)

## 2015-09-28 NOTE — Progress Notes (Signed)
STROKE TEAM PROGRESS NOTE   SUBJECTIVE (INTERVAL HISTORY) No family is at bedside. Her speech today is normal. Not able to move left side on exam, but observed functional yesterday when she was looking for her cell phone in bed and dialed number with her left hand. She did not get placement yesterday since she desires to continue smoking in rehab.   OBJECTIVE Temp:  [97.5 F (36.4 C)-98.3 F (36.8 C)] 97.5 F (36.4 C) (05/20 0950) Pulse Rate:  [55-74] 58 (05/20 0950) Cardiac Rhythm:  [-] Normal sinus rhythm (05/20 0807) Resp:  [16-18] 18 (05/20 0950) BP: (104-141)/(67-90) 104/76 mmHg (05/20 0950) SpO2:  [98 %-100 %] 99 % (05/20 0950)  CBC:   Recent Labs Lab 09/24/15 2210 09/24/15 2224 09/27/15 0605  WBC 4.5  --  3.9*  NEUTROABS 2.3  --   --   HGB 12.3 13.6 12.1  HCT 37.2 40.0 38.9  MCV 88.4  --  91.7  PLT 208  --  188    Basic Metabolic Panel:   Recent Labs Lab 09/24/15 2210 09/24/15 2224 09/27/15 0605  NA 137 139 139  K 3.6 3.5 4.2  CL 105 104 103  CO2 24  --  28  GLUCOSE 135* 125* 105*  BUN CREATININE 1.24* 1.20* 1.33*  CALCIUM 8.8*  --  9.1    Lipid Panel:     Component Value Date/Time   CHOL 154 09/25/2015 0638   TRIG 101 09/25/2015 0638   HDL 37* 09/25/2015 0638   CHOLHDL 4.2 09/25/2015 0638   VLDL 20 09/25/2015 0638   LDLCALC 97 09/25/2015 0638   HgbA1c:  Lab Results  Component Value Date   HGBA1C 5.4 09/25/2015   Urine Drug Screen:     Component Value Date/Time   LABOPIA NONE DETECTED 09/25/2015 0645   COCAINSCRNUR NONE DETECTED 09/25/2015 0645   LABBENZ NONE DETECTED 09/25/2015 0645   AMPHETMU NONE DETECTED 09/25/2015 0645   THCU POSITIVE* 09/25/2015 0645   LABBARB NONE DETECTED 09/25/2015 0645      IMAGING I have personally reviewed the radiological images below and agree with the radiology interpretations.  Ct Head Wo Contrast 09/24/2015  Tiny high attenuation focus LEFT frontal lobe, cannot exclude focus of acute  punctate hemorrhage. Remainder of exam unremarkable.   Mri & Mra Brain Wo Contrast 09/25/2015  1. Punctate acute infarct in the posterior right temporal lobe. No mass effect or hemorrhage. 2. Stable and normal intracranial MRA. 3. No finding to correspond to the punctate hyperdensity in the anterior left frontal lobe questioned on CT, and I favor that was artifact. 4. Expected encephalomalacia at the site of the right middle frontal gyrus infarct which occurred in October 2016.   LE venous doppler - Bilateral: No evidence of DVT, superficial thrombosis, or Baker's Cyst.   PHYSICAL EXAM  Temp:  [97.5 F (36.4 C)-98.3 F (36.8 C)] 97.5 F (36.4 C) (05/20 0950) Pulse Rate:  [55-74] 58 (05/20 0950) Resp:  [16-18] 18 (05/20 0950) BP: (104-141)/(67-90) 104/76 mmHg (05/20 0950) SpO2:  [98 %-100 %] 99 % (05/20 0950)  General - Well nourished, well developed, in no apparent distress.  Ophthalmologic - Fundi not visualized due to eye movement.  Cardiovascular - Regular rate and rhythm.  Mental Status -  Level of arousal and orientation to time, place, and person were intact. Language including expression, naming, repetition, comprehension was assessed and found intact. Fund of Knowledge was assessed and was intact.  Cranial Nerves II - XII -  II - Visual field intact OU. III, IV, VI - Extraocular movements intact. V - Facial sensation subjectively absent at right side, significant midline split with tuning fork exam. VII - Facial movement symmetrical but lack of effort. VIII - Hearing & vestibular intact bilaterally. X - Palate elevates symmetrically. XI - Chin turning & shoulder shrug intact bilaterally. XII - Tongue protrusion intact.  Motor Strength - The patient's strength was 0/5 LUE with significant give away weakness, 0/5 LLE with positive hoover's sign, RUE and RLE 5/5. Bulk was normal and fasciculations were absent.   Motor Tone - Muscle tone was assessed at the neck and  appendages and was normal.  Reflexes - The patient's reflexes were 1+ in all extremities and she had no pathological reflexes.  Sensory - Light touch, temperature/pinprick were assessed and were subjectively absent.  Coordination - The patient had normal movements in the right hand with no ataxia or dysmetria.  Tremor was absent.  Gait and Station - deferred to PT.   ASSESSMENT/PLAN [name] is a 35 y.o. female with history of multiple strokes, tobacco use, HTN and multiple psychiatric diseases presenting with  worsening left sided weakness and speech abnormalities. She did not receive IV t-PA.   Stroke:  Punctate right temporal lobe infarct, embolic likely secondary to known large PFO.  Resultant giveaway weakness and subjective right sensory loss  CT Left frontal punctate hyperdensity likely artifact  MRI  Punctate R temporal lobe infarct. No hemorrhage seen. Old R frontal gyrus infarct  MRA  Unremarkable   LE venous doppler negative for DVT  LDL 97  HgbA1c 5.7  lovenox for VTE prophylaxis Diet regular Room service appropriate?: Yes; Fluid consistency:: Thin  clopidogrel 75 mg daily and ASA 81mg  prior to admission, now on ASA 325mg  and  clopidogrel 75 mg daily   Patient counseled to be compliant with her antithrombotic medications  Ongoing aggressive stroke risk factor management  Therapy recommendations:  SNF  Disposition:  Pending  Conversion disorder  Significant give away weakness and lack of effort  Exam consistent with functional component  Observed normal movement of right side.  Recent argument with housing manager  PT/OT  Recurrent embolic stroke  August 2016 - 2 punctate left frontal white matter DWI changes  October 2016 - right MCA infarct with left frontal punctate DWI changes  November 2016 - asymptomatic left frontal punctate DWI changes  Known PFO  per TEE October 2016 admission  PFO closure recommended during Nov 2016  admission  followup arranged with Dr. Excell Seltzerooper - pt was a no show  She missed appointment due to no transport  May consider to refer to Dr. Excell Seltzerooper again, however, she likely will be noncompliance  ROPE score = 6  LE venous doppler negative  Hypertension  Stable  Hyperlipidemia  Home meds:  No statin listed. On lipitor 40 in the past  LDL 97, goal < 70  Resumed statin   Continue statin at discharge  Tobacco abuse  Cigarette smoker, advised to stop smoking  Pt is not ready for smoke cessation yet  Did not pick up SNF due to not able to smoke over there  Other Stroke Risk Factors  ETOH use  Marijuana use, UDS positive this admission  Family hx stroke (mother)  Migraines, weekly  Other Active Problems  Bipolar  Schizophrenia  Previously a resident at homeless shelter [facility]  Hospital day # 4  Tanya PlanJindong Laurella Tull, MD PhD Stroke Neurology 09/28/2015 1:39 PM  To contact Stroke Continuity provider, please refer to [email or web address] After hours, contact General Neurology

## 2015-09-29 LAB — GLUCOSE, CAPILLARY
GLUCOSE-CAPILLARY: 137 mg/dL — AB (ref 65–99)
GLUCOSE-CAPILLARY: 125 mg/dL — AB (ref 65–99)
GLUCOSE-CAPILLARY: 156 mg/dL — AB (ref 65–99)
Glucose-Capillary: 112 mg/dL — ABNORMAL HIGH (ref 65–99)

## 2015-09-29 NOTE — Progress Notes (Signed)
STROKE TEAM PROGRESS NOTE   SUBJECTIVE (INTERVAL HISTORY) No family is at bedside. Her speech today is normal. She was observed moving right arm and leg normal from nursing station camera, however, nurse stated that once nursing staff in the room, her right side became flaccid.    OBJECTIVE Temp:  [97.8 F (36.6 C)-98.7 F (37.1 C)] 98.7 F (37.1 C) (05/21 1745) Pulse Rate:  [59-89] 89 (05/21 1745) Cardiac Rhythm:  [-] Normal sinus rhythm (05/21 0733) Resp:  [18-20] 20 (05/21 1745) BP: (96-127)/(57-76) 116/76 mmHg (05/21 1745) SpO2:  [97 %-100 %] 97 % (05/21 1745)  CBC:   Recent Labs Lab 09/24/15 2210 09/24/15 2224 09/27/15 0605  WBC 4.5  --  3.9*  NEUTROABS 2.3  --   --   HGB 12.3 13.6 12.1  HCT 37.2 40.0 38.9  MCV 88.4  --  91.7  PLT 208  --  188    Basic Metabolic Panel:   Recent Labs Lab 09/24/15 2210 09/24/15 2224 09/27/15 0605  NA 137 139 139  K 3.6 3.5 4.2  CL 105 104 103  CO2 24  --  28  GLUCOSE 135* 125* 105*  BUN CREATININE 1.24* 1.20* 1.33*  CALCIUM 8.8*  --  9.1    Lipid Panel:     Component Value Date/Time   CHOL 154 09/25/2015 0638   TRIG 101 09/25/2015 0638   HDL 37* 09/25/2015 0638   CHOLHDL 4.2 09/25/2015 0638   VLDL 20 09/25/2015 0638   LDLCALC 97 09/25/2015 0638   HgbA1c:  Lab Results  Component Value Date   HGBA1C 5.4 09/25/2015   Urine Drug Screen:     Component Value Date/Time   LABOPIA NONE DETECTED 09/25/2015 0645   COCAINSCRNUR NONE DETECTED 09/25/2015 0645   LABBENZ NONE DETECTED 09/25/2015 0645   AMPHETMU NONE DETECTED 09/25/2015 0645   THCU POSITIVE* 09/25/2015 0645   LABBARB NONE DETECTED 09/25/2015 0645      IMAGING I have personally reviewed the radiological images below and agree with the radiology interpretations.  Ct Head Wo Contrast 09/24/2015  Tiny high attenuation focus LEFT frontal lobe, cannot exclude focus of acute punctate hemorrhage. Remainder of exam unremarkable.   Mri & Mra Brain Wo  Contrast 09/25/2015  1. Punctate acute infarct in the posterior right temporal lobe. No mass effect or hemorrhage. 2. Stable and normal intracranial MRA. 3. No finding to correspond to the punctate hyperdensity in the anterior left frontal lobe questioned on CT, and I favor that was artifact. 4. Expected encephalomalacia at the site of the right middle frontal gyrus infarct which occurred in October 2016.   LE venous doppler - Bilateral: No evidence of DVT, superficial thrombosis, or Baker's Cyst.   PHYSICAL EXAM  Temp:  [97.8 F (36.6 C)-98.7 F (37.1 C)] 98.7 F (37.1 C) (05/21 1745) Pulse Rate:  [59-89] 89 (05/21 1745) Resp:  [18-20] 20 (05/21 1745) BP: (96-127)/(57-76) 116/76 mmHg (05/21 1745) SpO2:  [97 %-100 %] 97 % (05/21 1745)  General - Well nourished, well developed, in no apparent distress.  Ophthalmologic - Fundi not visualized due to eye movement.  Cardiovascular - Regular rate and rhythm.  Mental Status -  Level of arousal and orientation to time, place, and person were intact. Language including expression, naming, repetition, comprehension was assessed and found intact. Fund of Knowledge was assessed and was intact.  Cranial Nerves II - XII - II - Visual field intact OU. III, IV, VI - Extraocular movements intact.  V - Facial sensation subjectively absent at right side, significant midline split with tuning fork exam. VII - Facial movement symmetrical but lack of effort. VIII - Hearing & vestibular intact bilaterally. X - Palate elevates symmetrically. XI - Chin turning & shoulder shrug intact bilaterally. XII - Tongue protrusion intact.  Motor Strength - The patient's strength was 0/5 LUE but with prompt showing significant give away weakness, 0/5 LLE with positive hoover's sign, RUE and RLE 5/5. Bulk was normal and fasciculations were absent.   Motor Tone - Muscle tone was assessed at the neck and appendages and was normal.  Reflexes - The patient's reflexes  were 1+ in all extremities and she had no pathological reflexes.  Sensory - Light touch, temperature/pinprick were assessed and were subjectively absent.  Coordination - The patient had normal movements in the right hand with no ataxia or dysmetria.  Tremor was absent.  Gait and Station - deferred to PT.   ASSESSMENT/PLAN Tanya Mayer is a 35 y.o. female with history of multiple strokes, tobacco use, HTN and multiple psychiatric diseases presenting with  worsening left sided weakness and speech abnormalities. She did not receive IV t-PA.   Stroke:  Punctate right temporal lobe infarct, embolic likely secondary to known large PFO.  Resultant giveaway weakness and subjective right sensory loss  CT Left frontal punctate hyperdensity likely artifact  MRI  Punctate R temporal lobe infarct. No hemorrhage seen. Old R frontal gyrus infarct  MRA  Unremarkable   LE venous doppler negative for DVT  LDL 97  HgbA1c 5.7  lovenox for VTE prophylaxis Diet regular Room service appropriate?: Yes; Fluid consistency:: Thin  clopidogrel 75 mg daily and ASA 81mg  prior to admission, now on ASA 325mg  and  clopidogrel 75 mg daily   Patient counseled to be compliant with her antithrombotic medications  Ongoing aggressive stroke risk factor management  Therapy recommendations:  SNF  Disposition:  Pending  Conversion disorder  Significant give away weakness and lack of effort  Exam consistent with functional component  Observed normal movement of right side.  Recent argument with housing manager  PT/OT  Recurrent embolic stroke  August 2016 - 2 punctate left frontal white matter DWI changes  October 2016 - right MCA infarct with left frontal punctate DWI changes  November 2016 - asymptomatic left frontal punctate DWI changes  Known PFO  per TEE October 2016 admission  PFO closure recommended during Nov 2016 admission  followup arranged with Dr. Excell Seltzerooper - pt was a no  show  She missed appointment due to no transport  May consider to refer to Dr. Excell Seltzerooper again, however, she likely will be noncompliance  ROPE score = 6  LE venous doppler negative  Hypertension  Stable  Hyperlipidemia  Home meds:  No statin listed. On lipitor 40 in the past  LDL 97, goal < 70  Resumed statin   Continue statin at discharge  Tobacco abuse  Cigarette smoker, advised to stop smoking  Pt is not ready for smoke cessation yet  Did not pick up SNF due to not able to smoke over there  Other Stroke Risk Factors  ETOH use  Marijuana use, UDS positive this admission  Family hx stroke (mother)  Migraines, weekly  Other Active Problems  Bipolar  Schizophrenia  Previously a resident at homeless shelter [facility]  Hospital day # 5  Marvel PlanJindong Stran Raper, MD PhD Stroke Neurology 09/29/2015 5:53 PM     To contact Stroke Continuity provider, please refer to [email or web address].  After hours, contact General Neurology

## 2015-09-30 LAB — GLUCOSE, CAPILLARY
GLUCOSE-CAPILLARY: 101 mg/dL — AB (ref 65–99)
Glucose-Capillary: 294 mg/dL — ABNORMAL HIGH (ref 65–99)
Glucose-Capillary: 94 mg/dL (ref 65–99)
Glucose-Capillary: 114 mg/dL — ABNORMAL HIGH (ref 65–99)

## 2015-09-30 LAB — HOMOCYSTEINE: Homocysteine: 20.2 umol/L — ABNORMAL HIGH (ref 0.0–15.0)

## 2015-09-30 MED ORDER — ATORVASTATIN CALCIUM 40 MG PO TABS
40.0000 mg | ORAL_TABLET | Freq: Every day | ORAL | Status: DC
Start: 2015-09-30 — End: 2017-11-25

## 2015-09-30 MED ORDER — ASPIRIN 325 MG PO TBEC: 325.0000 mg | DELAYED_RELEASE_TABLET | Freq: Every day | ORAL | Status: DC

## 2015-09-30 NOTE — Progress Notes (Signed)
[facility].  Osborne Cascoadia Veronica Fretz LCSWA [phone]

## 2015-09-30 NOTE — Progress Notes (Signed)
Patient stated that she would not be able to use a patch at a SNF and that she had to smoke (which is not allowed at facilities). Patient unwilling to go to [facility] due to distance. Therefore, patient cannot discharge to SNF and only option is to discharge home.  Osborne Cascoadia Maddalyn Lutze LCSWA [phone]

## 2015-09-30 NOTE — Care Management Note (Signed)
Case Management Note  Patient Details  Name: Tanya Mayer MRN: [id number] Date of Birth: 12-31-1980  Subjective/Objective:                    Action/Plan: Patient discharging home with orders for [facility] services and wheelchair. CM met with the patient and provided her a list of [facility] agencies in the [place] area. She selected [facility]. Mary with [facility] notified and she accepted the referral. Jermaine with [facility] notified of the wheelchair with cushion and he will deliver the DME to the room. Pt unable to use [facility] with wheelchair to get home and she does not want the wheelchair mailed. One issue is the patient lives on the 3rd floor in an apartment. Pt states she could have one of her female friends assist her into her apartment once she arrives. She called her friend Tanya Mayer while CM was in the room and he agreed to assist her up the 3 flights of stairs. CM called Medicaid transportation to see if she could be transported home with their services. Per Medicaid transportation she has not had her assessment yet and she needs to f/u with her Medicaid CSW. Pt informed. CM spoke with the patient and she is comfortable with taking a taxi home. She stated her friend would meet her at her apartment and assist her out of the cab and into her apartment. CM asked about meals at home and she stated she would have friends either bring her food or have them come cook for her. Bedside RN updated.  Expected Discharge Date:                  Expected Discharge Plan:  [place]  In-House Referral:  Clinical Social Work  Discharge planning Services  CM Consult  Post Acute Care Choice:  Durable Medical Equipment, Home Health Choice offered to:  Patient  DME Arranged:  3-N-1 DME Agency:  [place]:  PT, OT, Nurse's Aide [place] Agency:  [place]  Status of Service:  Completed, signed off  Medicare Important Message Given:    Date Medicare IM Given:     Medicare IM give by:    Date Additional Medicare IM Given:    Additional Medicare Important Message give by:     If discussed at [place] of Stay Meetings, dates discussed:    Additional Comments:  Pollie Friar, RN 09/30/2015, 4:16 PM

## 2015-09-30 NOTE — Progress Notes (Deleted)
Stroke Discharge Summary  Patient ID: Tanya EisenmengerLatonya Mayer    l   MRN: [id number]      DOB: 11/09/1980  Date of Admission: 09/24/2015 Date of Discharge: 09/30/2015  Attending Physician:  Marvel PlanJindong Jaaziah Schulke, MD, Stroke MD Consulting Physician(s):      Patient's PCP:  No PCP Per Patient  DISCHARGE DIAGNOSIS:  Active Problems:   Right temporal small embolic infarct   OFO   Conversion disorder   DM   HLD   Tobacco abuse   BMI: There is no weight on file to calculate BMI.  Past Medical History  Diagnosis Date  . Hypertension   . Heart murmur     "born w/one"  . Hyperlipemia   . Anginal pain (HCC)   . DVT (deep venous thrombosis) (HCC) after 2008    "BLE"  . Bronchial asthma   . Anemia   . GERD (gastroesophageal reflux disease)   . Migraine     "weekly" (03/19/2015)  . Chronic lower back pain   . Bipolar disorder (HCC)   . Schizophrenia (HCC)   . DM (diabetes mellitus) type II uncontrolled, periph vascular disorder (HCC)     notes 01/03/2015  . Seizures (HCC)     epilepsy/notes 02/27/2015  . Stroke (HCC) 04/2014; 09/2014(/notes 04/25/2015);  12/2014; 01/2015; 02/2015    "left side weakness; speech problems since" (03/19/2015)  . Stroke (HCC) 03/19/2015    "left side weakness; speech problems" (03/19/2015)  . PFO (patent foramen ovale)     large, per TEE 03/01/2015   Past Surgical History  Procedure Laterality Date  . 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 Tee without cardioversion N/A 03/01/2015    Procedure: TRANSESOPHAGEAL ECHOCARDIOGRAM (TEE);  Surgeon: Thurmon FairMihai Croitoru, MD;  [facility];  Service: Cardiovascular;  Laterality: N/A;  . Hernia repair    . Umbilical hernia repair  "@ birth"  . Cesarean section  2008  . Tubal ligation  2008      Medication List    STOP taking these medications        ibuprofen 800 MG tablet  Commonly known as:  ADVIL,MOTRIN     oxycodone 30 MG immediate release tablet  Commonly known as:  ROXICODONE     predniSONE 10 MG (21) Tbpk tablet   Commonly known as:  STERAPRED UNI-PAK 21 TAB      TAKE these medications        acetaminophen 500 MG tablet  Commonly known as:  TYLENOL  Take 500 mg by mouth every 6 (six) hours as needed for moderate pain.     albuterol 108 (90 Base) MCG/ACT inhaler  Commonly known as:  PROVENTIL HFA;VENTOLIN HFA  Inhale 2 puffs into the lungs every 6 (six) hours as needed for wheezing or shortness of breath.     ALPRAZolam 1 MG tablet  Commonly known as:  XANAX  Take 1 tablet (1 mg total) by mouth 2 (two) times daily as needed for anxiety.     aspirin 325 MG EC tablet  Take 1 tablet (325 mg total) by mouth daily.     atorvastatin 40 MG tablet  Commonly known as:  LIPITOR  Take 1 tablet (40 mg total) by mouth daily at 6 PM.     clopidogrel 75 MG tablet  Commonly known as:  PLAVIX  Take 1 tablet (75 mg total) by mouth daily.     diphenhydramine-acetaminophen 25-500 MG Tabs tablet  Commonly known as:  TYLENOL PM  Take 1 tablet by mouth at bedtime as needed (sleep).     famotidine 20 MG tablet  Commonly known as:  PEPCID  Take 1 tablet (20 mg total) by mouth 2 (two) times daily.     gabapentin 300 MG capsule  Commonly known as:  NEURONTIN  Take 3 capsules (900 mg total) by mouth 3 (three) times daily.     hydrOXYzine 50 MG capsule  Commonly known as:  VISTARIL  Take 1 capsule (50 mg total) by mouth at bedtime.     insulin detemir 100 UNIT/ML injection  Commonly known as:  LEVEMIR  Inject 0.1 mLs (10 Units total) into the skin at bedtime.     QUEtiapine 300 MG tablet  Commonly known as:  SEROQUEL  Take 1 tablet (300 mg total) by mouth at bedtime.     risperiDONE 0.25 MG tablet  Commonly known as:  RISPERDAL  Take 1 tablet (0.25 mg total) by mouth 2 (two) times daily.     trazodone 300 MG tablet  Commonly known as:  DESYREL  Take 1 tablet (300 mg total) by mouth at bedtime.        LABORATORY STUDIES CBC    Component Value Date/Time   WBC 3.9* 09/27/2015 0605   RBC  4.24 09/27/2015 0605   HGB 12.1 09/27/2015 0605   HCT 38.9 09/27/2015 0605   PLT 188 09/27/2015 0605   MCV 91.7 09/27/2015 0605   MCH 28.5 09/27/2015 0605   MCHC 31.1 09/27/2015 0605   RDW 13.2 09/27/2015 0605   LYMPHSABS 1.7 09/24/2015 2210   MONOABS 0.4 09/24/2015 2210   EOSABS 0.1 09/24/2015 2210   BASOSABS 0.0 09/24/2015 2210   CMP    Component Value Date/Time   NA 139 09/27/2015 0605   K 4.2 09/27/2015 0605   CL 103 09/27/2015 0605   CO2 28 09/27/2015 0605   GLUCOSE 105* 09/27/2015 0605   BUN 7 09/27/2015 0605   CREATININE 1.33* 09/27/2015 0605   CALCIUM 9.1 09/27/2015 0605   PROT 6.3* 09/24/2015 2210   ALBUMIN 3.5 09/24/2015 2210   AST 20 09/24/2015 2210   ALT 14 09/24/2015 2210   ALKPHOS 98 09/24/2015 2210   BILITOT 0.2* 09/24/2015 2210   GFRNONAA 51* 09/27/2015 0605   GFRAA 59* 09/27/2015 0605   COAGS Lab Results  Component Value Date   INR 1.07 09/24/2015   INR 1.00 03/19/2015   INR 1.00 02/27/2015   Lipid Panel    Component Value Date/Time   CHOL 154 09/25/2015 0638   TRIG 101 09/25/2015 0638   HDL 37* 09/25/2015 0638   CHOLHDL 4.2 09/25/2015 0638   VLDL 20 09/25/2015 0638   LDLCALC 97 09/25/2015 0638   HgbA1C  Lab Results  Component Value Date   HGBA1C 5.4 09/25/2015   Cardiac Panel (last 3 results) No results for input(s): CKTOTAL, CKMB, TROPONINI, RELINDX in the last 72 hours. Urinalysis    Component Value Date/Time   COLORURINE YELLOW 09/25/2015 0645   APPEARANCEUR CLOUDY* 09/25/2015 0645   LABSPEC 1.020 09/25/2015 0645   PHURINE 6.0 09/25/2015 0645   GLUCOSEU NEGATIVE 09/25/2015 0645   HGBUR NEGATIVE 09/25/2015 0645   BILIRUBINUR NEGATIVE 09/25/2015 0645   KETONESUR NEGATIVE 09/25/2015 0645   PROTEINUR 100* 09/25/2015 0645   UROBILINOGEN 0.2 02/27/2015 1146   NITRITE POSITIVE* 09/25/2015 0645   LEUKOCYTESUR SMALL* 09/25/2015 0645   Urine Drug Screen     Component Value Date/Time   LABOPIA NONE DETECTED 09/25/2015 0645    COCAINSCRNUR  NONE DETECTED 09/25/2015 0645   LABBENZ NONE DETECTED 09/25/2015 0645   AMPHETMU NONE DETECTED 09/25/2015 0645   THCU POSITIVE* 09/25/2015 0645   LABBARB NONE DETECTED 09/25/2015 0645    Alcohol Level    Component Value Date/Time   ETH <5 09/24/2015 2210     SIGNIFICANT DIAGNOSTIC STUDIES Ct Head Wo Contrast 09/24/2015 Tiny high attenuation focus LEFT frontal lobe, cannot exclude focus of acute punctate hemorrhage. Remainder of exam unremarkable.   Mri & Mra Brain Wo Contrast 09/25/2015 1. Punctate acute infarct in the posterior right temporal lobe. No mass effect or hemorrhage. 2. Stable and normal intracranial MRA. 3. No finding to correspond to the punctate hyperdensity in the anterior left frontal lobe questioned on CT, and I favor that was artifact. 4. Expected encephalomalacia at the site of the right middle frontal gyrus infarct which occurred in October 2016.   LE venous doppler - Bilateral: No evidence of DVT, superficial thrombosis, or Baker's Cyst.    HISTORY OF PRESENT ILLNESS Tanya Mayer is an 35 yo female with multiple psychiatric history and hx of CVA with left sided residual weakness presented with acute onset of worsening left sided weakness and speech that started at 830pm. TPA not given due to possible ICH on [facility]   HOSPITAL COURSE Tanya Mayer is a 35 y.o. female with history of multiple strokes, tobacco use, HTN and multiple psychiatric diseases presenting with worsening left sided weakness and speech abnormalities. She did not receive IV t-PA. MRI showed no ICH but right temporal small infarct, likely embolic from large PFO. However, she was found to have conversion disorder with left sided hemiplegia but functional. She was observed able to use left UE and LE in room. She is not willing to quit smoking. 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 Her symptoms fluctuate but stable. She was  discharged with home health.   Stroke: Punctate right temporal lobe infarct, embolic likely secondary to known large PFO.  Resultant giveaway weakness and subjective right sensory loss  CT Left frontal punctate hyperdensity likely artifact  MRI Punctate R temporal lobe infarct. No hemorrhage seen. Old R frontal gyrus infarct  MRA Unremarkable  LE venous doppler negative for DVT  LDL 97  HgbA1c 5.7  lovenox for VTE prophylaxis  Diet regular Room service appropriate?: Yes; Fluid consistency:: Thin  clopidogrel 75 mg daily and ASA 81mg  prior to admission, now on ASA 325mg  and clopidogrel 75 mg daily.  Patient counseled to be compliant with her antithrombotic medications  Ongoing aggressive stroke risk factor management  Therapy recommendations: HH PT OT nursing aide  Disposition: home  Conversion disorder  Significant give away weakness and lack of effort  Exam consistent with functional component  Observed normal movement of right side.  Recent argument with housing manager  [facility] PT/OT  Recurrent embolic stroke  August 2016 - 2 punctate left frontal white matter DWI changes  October 2016 - right MCA infarct with left frontal punctate DWI changes  November 2016 - asymptomatic left frontal punctate DWI changes  Known PFO  per TEE October 2016 admission  PFO closure recommended during Nov 2016 admission  followup arranged with Dr. Excell Seltzer - pt was a no show  She missed appointment due to no transport  May consider to refer to Dr. Excell Seltzer again, however, she likely will be noncompliance. Hold off referral at this time.  ROPE score = 6  LE venous doppler negative  Hypertension  Stable  Hyperlipidemia  Home meds: No statin listed. On lipitor 40 in the past  LDL 97, goal < 70  Resumed statin   Continue statin at discharge  Tobacco abuse  Cigarette smoker, advised to stop smoking  Pt is willing to quit smoking at this time   Other  Stroke Risk Factors  ETOH use  Marijuana use, UDS positive this admission  Family hx stroke (mother)  Migraines, weekly  Other Active Problems  Bipolar  Schizophrenia  Previously a resident at homeless shelter [facility]  DISCHARGE EXAM Blood pressure 112/71, pulse 60, temperature 98.1 F (36.7 C), temperature source Oral, resp. rate 20, height 5' (1.524 m), last menstrual period 09/22/2015, SpO2 100 %.  General - Well nourished, well developed, in no apparent distress.  Ophthalmologic - Fundi not visualized due to eye movement.  Cardiovascular - Regular rate and rhythm.  Mental Status -  Level of arousal and orientation to time, place, and person were intact. Language including expression, naming, repetition, comprehension was assessed and found intact. Fund of Knowledge was assessed and was intact.  Cranial Nerves II - XII - II - Visual field intact OU. III, IV, VI - Extraocular movements intact. V - Facial sensation subjectively absent at right side, significant midline split with tuning fork exam. VII - Facial movement symmetrical but lack of effort. VIII - Hearing & vestibular intact bilaterally. X - Palate elevates symmetrically. XI - Chin turning & shoulder shrug intact bilaterally. XII - Tongue protrusion intact.  Motor Strength - The patient's strength was 0/5 LUE but with prompt showing significant give away weakness, 0/5 LLE with positive hoover's sign, RUE and RLE 5/5. Bulk was normal and fasciculations were absent.  Motor Tone - Muscle tone was assessed at the neck and appendages and was normal.  Reflexes - The patient's reflexes were 1+ in all extremities and she had no pathological reflexes.  Sensory - Light touch, temperature/pinprick were assessed and were subjectively absent.  Coordination - The patient had normal movements in the right hand with no ataxia or dysmetria. Tremor was absent.  Gait and Station - deferred to PT.  Discharge  Diet   Diet regular Room service appropriate?: Yes; Fluid consistency:: Thin Diet - low sodium heart healthy liquids  DISCHARGE PLAN  Disposition:  Home with HH PT OT nursing aide   aspirin 325 mg daily and clopidogrel 75 mg daily for secondary stroke prevention.  Follow-up PCP in 2 weeks.  Follow-up with Dr. Marvel Plan in Stroke Clinic in 2 months, office to schedule an appointment.  35 minutes were spent preparing discharge.  Marvel Plan, MD PhD Stroke Neurology 09/30/2015 4:26 PM

## 2015-09-30 NOTE — Progress Notes (Signed)
Physical Therapy Treatment Patient Details Name: Tanya Mayer MRN: [id number] DOB: 09/15/1980 Today's Date: 09/30/2015    History of Present Illness Tanya Mayer is an 35 yo female with multiple psychiatric history and hx of CVA with left sided residual weakness, PFO, depression, bipolar, schizophrenia and HTN presented with acute onset of worsening left sided weakness and speech    PT Comments    Patient demonstrates inconsistencies during formal exam of MMT vs functional tasks with LUE/LE. Able to grasp walker handle with left hand and stand upright pushing through triceps without left arm giving way despite no motor movement during MMT. Able to place full weight through LLE performing SLS on left without knee buckling despite 0/5 with knee extension. Inconsistent results when testing facial expressions, "how does my face look?" No noticeable changes during session but when specific testing performed, some drooping noted?? Reports horrible migraine but able to tell this therapist full life story, and cooperative fully with session with no distractions or mention of pain again. Also reports seeing shooting stars in both eyes today. Will follow.   Follow Up Recommendations  SNF;Supervision/Assistance - 24 hour     Equipment Recommendations  Other (comment) (TBA at next venue)    Recommendations for Other Services       Precautions / Restrictions Precautions Precautions: Fall    Mobility  Bed Mobility Overal bed mobility: Needs Assistance Bed Mobility: Rolling;Sidelying to Sit Rolling: Min guard Sidelying to sit: Min assist;HOB elevated       General bed mobility comments: Assist to mobilize LLE, but muscle activation noted with pt helping slide LLE to EOB. Heavy use of rail.  Transfers Overall transfer level: Needs assistance Equipment used: Rolling walker (2 wheeled) Transfers: Sit to/from Stand Sit to Stand: Mod assist Stand pivot transfers: Min assist        General transfer comment: Mod A to boost from EOB with pt able to use RUE to place LUE on walker handle; able extend digits and grasp handle despite no movement on command. Able to extend trunk and hips with verbal cues. Right knee flexion in standing despite testing 5/5; left knee extended in standing, no buckling despite 0/5 knee extensio during MMT.  Able to drag LLE to pivot to chair with no knee buckling with weight bearing.  Ambulation/Gait                 Stairs            Wheelchair Mobility    Modified Rankin (Stroke Patients Only) Modified Rankin (Stroke Patients Only) Pre-Morbid Rankin Score: No significant disability Modified Rankin: Moderately severe disability     Balance Overall balance assessment: Needs assistance Sitting-balance support: Feet supported;No upper extremity supported Sitting balance-Leahy Scale: Good     Standing balance support: During functional activity Standing balance-Leahy Scale: Poor Standing balance comment: Able to place weight through BUEs including LUE without difficulty to obtain upright. Able to grasp walker handle with left hand and strong activation of triceps in standing to keep upright. Able to weight shifting and perform SLS on LLE without knee buckling x10 and shows resistance when trying to lift LLE off ground as pt pushing LLE into ground.                     Cognition Arousal/Alertness: Awake/alert Behavior During Therapy: WFL for tasks assessed/performed Overall Cognitive Status: Within Functional Limits for tasks assessed  Exercises      General Comments General comments (skin integrity, edema, etc.): "how does my face look? I did not get to see it?" Asked pt to smile and blow out cheeks and squint eyes- pt inconsistently able to perform those movements on left side of face but subdues them based on therapist's reactions.      Pertinent Vitals/Pain Pain Assessment:  Faces Faces Pain Scale: Hurts little more Pain Location: migraine, left side Pain Descriptors / Indicators: Headache;Sore Pain Intervention(s): Monitored during session    Home Living                      Prior Function            PT Goals (current goals can now be found in the care plan section) Progress towards PT goals: Not progressing toward goals - comment    Frequency  Min 4X/week    PT Plan Current plan remains appropriate    Co-evaluation             End of Session Equipment Utilized During Treatment: Gait belt Activity Tolerance: Patient tolerated treatment well Patient left: in chair;with call bell/phone within reach;with chair alarm set     Time: 0938-1010 PT Time Calculation (min) (ACUTE ONLY): 32 min  Charges:  $Gait Training: 8-22 mins $Therapeutic Activity: 8-22 mins                    G Codes:      Ade Stmarie A Aranda Bihm 09/30/2015, 10:26 AM Mylo RedShauna Saleh Ulbrich, PT, DPT [phone]

## 2015-09-30 NOTE — Progress Notes (Signed)
Pt to be discharged to home, pt is going via wheelchair and taxi. She is to be met by a friend at her house to help her in her apartment.

## 2015-09-30 NOTE — Discharge Summary (Signed)
Stroke Discharge Summary  Patient ID: Tanya Mayer    l   MRN: [id number]      DOB: 11/09/1980  Date of Admission: 09/24/2015 Date of Discharge: 09/30/2015  Attending Physician:  Marvel PlanJindong Latania Bascomb, MD, Stroke MD Consulting Physician(s):      Patient's PCP:  No PCP Per Patient  DISCHARGE DIAGNOSIS:  Active Problems:   Right temporal small embolic infarct   OFO   Conversion disorder   DM   HLD   Tobacco abuse   BMI: There is no weight on file to calculate BMI.  Past Medical History  Diagnosis Date  . Hypertension   . Heart murmur     "born w/one"  . Hyperlipemia   . Anginal pain (HCC)   . DVT (deep venous thrombosis) (HCC) after 2008    "BLE"  . Bronchial asthma   . Anemia   . GERD (gastroesophageal reflux disease)   . Migraine     "weekly" (03/19/2015)  . Chronic lower back pain   . Bipolar disorder (HCC)   . Schizophrenia (HCC)   . DM (diabetes mellitus) type II uncontrolled, periph vascular disorder (HCC)     notes 01/03/2015  . Seizures (HCC)     epilepsy/notes 02/27/2015  . Stroke (HCC) 04/2014; 09/2014(/notes 04/25/2015);  12/2014; 01/2015; 02/2015    "left side weakness; speech problems since" (03/19/2015)  . Stroke (HCC) 03/19/2015    "left side weakness; speech problems" (03/19/2015)  . PFO (patent foramen ovale)     large, per TEE 03/01/2015   Past Surgical History  Procedure Laterality Date  . 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 Tee without cardioversion N/A 03/01/2015    Procedure: TRANSESOPHAGEAL ECHOCARDIOGRAM (TEE);  Surgeon: Thurmon FairMihai Croitoru, MD;  [facility];  Service: Cardiovascular;  Laterality: N/A;  . Hernia repair    . Umbilical hernia repair  "@ birth"  . Cesarean section  2008  . Tubal ligation  2008      Medication List    STOP taking these medications        ibuprofen 800 MG tablet  Commonly known as:  ADVIL,MOTRIN     oxycodone 30 MG immediate release tablet  Commonly known as:  ROXICODONE     predniSONE 10 MG (21) Tbpk tablet   Commonly known as:  STERAPRED UNI-PAK 21 TAB      TAKE these medications        acetaminophen 500 MG tablet  Commonly known as:  TYLENOL  Take 500 mg by mouth every 6 (six) hours as needed for moderate pain.     albuterol 108 (90 Base) MCG/ACT inhaler  Commonly known as:  PROVENTIL HFA;VENTOLIN HFA  Inhale 2 puffs into the lungs every 6 (six) hours as needed for wheezing or shortness of breath.     ALPRAZolam 1 MG tablet  Commonly known as:  XANAX  Take 1 tablet (1 mg total) by mouth 2 (two) times daily as needed for anxiety.     aspirin 325 MG EC tablet  Take 1 tablet (325 mg total) by mouth daily.     atorvastatin 40 MG tablet  Commonly known as:  LIPITOR  Take 1 tablet (40 mg total) by mouth daily at 6 PM.     clopidogrel 75 MG tablet  Commonly known as:  PLAVIX  Take 1 tablet (75 mg total) by mouth daily.     diphenhydramine-acetaminophen 25-500 MG Tabs tablet  Commonly known as:  TYLENOL PM  Take 1 tablet by mouth at bedtime as needed (sleep).     famotidine 20 MG tablet  Commonly known as:  PEPCID  Take 1 tablet (20 mg total) by mouth 2 (two) times daily.     gabapentin 300 MG capsule  Commonly known as:  NEURONTIN  Take 3 capsules (900 mg total) by mouth 3 (three) times daily.     hydrOXYzine 50 MG capsule  Commonly known as:  VISTARIL  Take 1 capsule (50 mg total) by mouth at bedtime.     insulin detemir 100 UNIT/ML injection  Commonly known as:  LEVEMIR  Inject 0.1 mLs (10 Units total) into the skin at bedtime.     QUEtiapine 300 MG tablet  Commonly known as:  SEROQUEL  Take 1 tablet (300 mg total) by mouth at bedtime.     risperiDONE 0.25 MG tablet  Commonly known as:  RISPERDAL  Take 1 tablet (0.25 mg total) by mouth 2 (two) times daily.     trazodone 300 MG tablet  Commonly known as:  DESYREL  Take 1 tablet (300 mg total) by mouth at bedtime.        LABORATORY STUDIES CBC    Component Value Date/Time   WBC 3.9* 09/27/2015 0605   RBC  4.24 09/27/2015 0605   HGB 12.1 09/27/2015 0605   HCT 38.9 09/27/2015 0605   PLT 188 09/27/2015 0605   MCV 91.7 09/27/2015 0605   MCH 28.5 09/27/2015 0605   MCHC 31.1 09/27/2015 0605   RDW 13.2 09/27/2015 0605   LYMPHSABS 1.7 09/24/2015 2210   MONOABS 0.4 09/24/2015 2210   EOSABS 0.1 09/24/2015 2210   BASOSABS 0.0 09/24/2015 2210   CMP    Component Value Date/Time   NA 139 09/27/2015 0605   K 4.2 09/27/2015 0605   CL 103 09/27/2015 0605   CO2 28 09/27/2015 0605   GLUCOSE 105* 09/27/2015 0605   BUN 7 09/27/2015 0605   CREATININE 1.33* 09/27/2015 0605   CALCIUM 9.1 09/27/2015 0605   PROT 6.3* 09/24/2015 2210   ALBUMIN 3.5 09/24/2015 2210   AST 20 09/24/2015 2210   ALT 14 09/24/2015 2210   ALKPHOS 98 09/24/2015 2210   BILITOT 0.2* 09/24/2015 2210   GFRNONAA 51* 09/27/2015 0605   GFRAA 59* 09/27/2015 0605   COAGS Lab Results  Component Value Date   INR 1.07 09/24/2015   INR 1.00 03/19/2015   INR 1.00 02/27/2015   Lipid Panel    Component Value Date/Time   CHOL 154 09/25/2015 0638   TRIG 101 09/25/2015 0638   HDL 37* 09/25/2015 0638   CHOLHDL 4.2 09/25/2015 0638   VLDL 20 09/25/2015 0638   LDLCALC 97 09/25/2015 0638   HgbA1C  Lab Results  Component Value Date   HGBA1C 5.4 09/25/2015   Cardiac Panel (last 3 results) No results for input(s): CKTOTAL, CKMB, TROPONINI, RELINDX in the last 72 hours. Urinalysis    Component Value Date/Time   COLORURINE YELLOW 09/25/2015 0645   APPEARANCEUR CLOUDY* 09/25/2015 0645   LABSPEC 1.020 09/25/2015 0645   PHURINE 6.0 09/25/2015 0645   GLUCOSEU NEGATIVE 09/25/2015 0645   HGBUR NEGATIVE 09/25/2015 0645   BILIRUBINUR NEGATIVE 09/25/2015 0645   KETONESUR NEGATIVE 09/25/2015 0645   PROTEINUR 100* 09/25/2015 0645   UROBILINOGEN 0.2 02/27/2015 1146   NITRITE POSITIVE* 09/25/2015 0645   LEUKOCYTESUR SMALL* 09/25/2015 0645   Urine Drug Screen     Component Value Date/Time   LABOPIA NONE DETECTED 09/25/2015 0645    COCAINSCRNUR  NONE DETECTED 09/25/2015 0645   LABBENZ NONE DETECTED 09/25/2015 0645   AMPHETMU NONE DETECTED 09/25/2015 0645   THCU POSITIVE* 09/25/2015 0645   LABBARB NONE DETECTED 09/25/2015 0645    Alcohol Level    Component Value Date/Time   ETH <5 09/24/2015 2210     SIGNIFICANT DIAGNOSTIC STUDIES Ct Head Wo Contrast 09/24/2015 Tiny high attenuation focus LEFT frontal lobe, cannot exclude focus of acute punctate hemorrhage. Remainder of exam unremarkable.   Mri & Mra Brain Wo Contrast 09/25/2015 1. Punctate acute infarct in the posterior right temporal lobe. No mass effect or hemorrhage. 2. Stable and normal intracranial MRA. 3. No finding to correspond to the punctate hyperdensity in the anterior left frontal lobe questioned on CT, and I favor that was artifact. 4. Expected encephalomalacia at the site of the right middle frontal gyrus infarct which occurred in October 2016.   LE venous doppler - Bilateral: No evidence of DVT, superficial thrombosis, or Baker's Cyst.    HISTORY OF PRESENT ILLNESS Tanya Mayer is an 35 yo female with multiple psychiatric history and hx of CVA with left sided residual weakness presented with acute onset of worsening left sided weakness and speech that started at 830pm. TPA not given due to possible ICH on [facility]   HOSPITAL COURSE [name] is a 35 y.o. female with history of multiple strokes, tobacco use, HTN and multiple psychiatric diseases presenting with worsening left sided weakness and speech abnormalities. She did not receive IV t-PA. MRI showed no ICH but right temporal small infarct, likely embolic from large PFO. However, she was found to have conversion disorder with left sided hemiplegia but functional. She was observed able to use left UE and LE in room. She is not willing to quit smoking. 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 Her symptoms fluctuate but stable. She was  discharged with home health.   Stroke: Punctate right temporal lobe infarct, embolic likely secondary to known large PFO.  Resultant giveaway weakness and subjective right sensory loss  CT Left frontal punctate hyperdensity likely artifact  MRI Punctate R temporal lobe infarct. No hemorrhage seen. Old R frontal gyrus infarct  MRA Unremarkable  LE venous doppler negative for DVT  LDL 97  HgbA1c 5.7  lovenox for VTE prophylaxis  Diet regular Room service appropriate?: Yes; Fluid consistency:: Thin  clopidogrel 75 mg daily and ASA 81mg  prior to admission, now on ASA 325mg  and clopidogrel 75 mg daily.  Patient counseled to be compliant with her antithrombotic medications  Ongoing aggressive stroke risk factor management  Therapy recommendations: HH PT OT nursing aide  Disposition: home  Conversion disorder  Significant give away weakness and lack of effort  Exam consistent with functional component  Observed normal movement of right side.  Recent argument with housing manager  [facility] PT/OT  Recurrent embolic stroke  August 2016 - 2 punctate left frontal white matter DWI changes  October 2016 - right MCA infarct with left frontal punctate DWI changes  November 2016 - asymptomatic left frontal punctate DWI changes  Known PFO  per TEE October 2016 admission  PFO closure recommended during Nov 2016 admission  followup arranged with Dr. Excell Seltzer - pt was a no show  She missed appointment due to no transport  May consider to refer to Dr. Excell Seltzer again, however, she likely will be noncompliance. Hold off referral at this time.  ROPE score = 6  LE venous doppler negative  Hypertension  Stable  Hyperlipidemia  Home meds: No statin listed. On lipitor 40 in the past  LDL 97, goal < 70  Resumed statin   Continue statin at discharge  Tobacco abuse  Cigarette smoker, advised to stop smoking  Pt is willing to quit smoking at this time   Other  Stroke Risk Factors  ETOH use  Marijuana use, UDS positive this admission  Family hx stroke (mother)  Migraines, weekly  Other Active Problems  Bipolar  Schizophrenia  Previously a resident [facility] homeless [facility]  DISCHARGE EXAM Blood pressure 112/71, pulse 60, temperature 98.1 F (36.7 C), temperature source Oral, resp. rate 20, height 5' (1.524 m), last menstrual period 09/22/2015, SpO2 100 %.  General - Well nourished, well developed, in no apparent distress.  Ophthalmologic - Fundi not visualized due to eye movement.  Cardiovascular - Regular rate and rhythm.  Mental Status -  Level of arousal and orientation to time, place, and person were intact. Language including expression, naming, repetition, comprehension was assessed and found intact. Fund of Knowledge was assessed and was intact.  Cranial Nerves II - XII - II - Visual field intact OU. III, IV, VI - Extraocular movements intact. V - Facial sensation subjectively absent at right side, significant midline split with tuning fork exam. VII - Facial movement symmetrical but lack of effort. VIII - Hearing & vestibular intact bilaterally. X - Palate elevates symmetrically. XI - Chin turning & shoulder shrug intact bilaterally. XII - Tongue protrusion intact.  Motor Strength - The patient's strength was 0/5 LUE but with prompt showing significant give away weakness, 0/5 LLE with positive hoover's sign, RUE and RLE 5/5. Bulk was normal and fasciculations were absent.  Motor Tone - Muscle tone was assessed at the neck and appendages and was normal.  Reflexes - The patient's reflexes were 1+ in all extremities and she had no pathological reflexes.  Sensory - Light touch, temperature/pinprick were assessed and were subjectively absent.  Coordination - The patient had normal movements in the right hand with no ataxia or dysmetria. Tremor was absent.  Gait and Station - deferred to PT.  Discharge  Diet   Diet regular Room service appropriate?: Yes; Fluid consistency:: Thin Diet - low sodium heart healthy liquids  DISCHARGE PLAN  Disposition:  Home with HH PT OT nursing aide   aspirin 325 mg daily and clopidogrel 75 mg daily for secondary stroke prevention.  Follow-up PCP in 2 weeks.  Follow-up with Dr. Marvel Plan in Stroke Clinic in 2 months, office to schedule an appointment.  35 minutes were spent preparing discharge.  Marvel Plan, MD PhD Stroke Neurology 09/30/2015 4:26 PM

## 2015-10-02 ENCOUNTER — Telehealth: Payer: Self-pay | Admitting: Neurology

## 2015-10-02 NOTE — Telephone Encounter (Signed)
The patient called back, she has no primary care physician, she wants a prescription for Xanax, and oxycodone. Oxycodone is not listed as one of her medications. She indicates that she has a bad headache, once medications for this. She was recently in the hospital, was seen by Dr. Roda ShuttersXu, was to follow-up with him within 2 months following the discharge. I will send this note to Dr. Roda ShuttersXu. The patient so far does not have a revisit set up with this office, has never been seen in this office before.

## 2015-10-02 NOTE — Telephone Encounter (Signed)
I called the patient, she called after hours, asked to call back to a number [phone]. Left a message. I also called the patient's cell phone number, home number, unable to leave a message. The patient was seen in the hospital for a stroke event, she was seen by Dr. Roda ShuttersXu. She has not been seen as an outpatient through this office. No revisits has been scheduled. She is calling for pain medications, no pain medications are listed on her medications in EPIC.

## 2015-10-02 NOTE — Telephone Encounter (Signed)
I totally agree. She needs to have PCP to prescribe those for her if indicated. She has medicaid and she can find a PCP.   Hi, [name], could you please call her back and ask her to find a PCP via Medicaid program? PCP will take care of her medications. Thanks.  Marvel PlanJindong Dalene Robards, MD PhD Stroke Neurology 10/02/2015 10:38 PM

## 2015-10-03 ENCOUNTER — Telehealth: Payer: Self-pay | Admitting: *Deleted

## 2015-10-03 NOTE — Telephone Encounter (Addendum)
LFt vm on home number that pt needs to contact her PCP,or urgent care for refills on xanax and oxycodone. Dr. Roda ShuttersXu will not prescribed these two meds.

## 2015-10-03 NOTE — Telephone Encounter (Signed)
LFt vm that her PCP needs to prescribed xanax and oxycodone.

## 2015-10-03 NOTE — Telephone Encounter (Signed)
Message For: O/C              (41)     Taken 24-MAY-17 at  5:20PM by [facility] LPKAR Delivered 24-MAY-17 at  7:44PM by SMD to                ------------------------------------------------------------ Joya Sanaller Karson Luhn             CID [id number]  Patient SAME                 Pt's Dr Willy EddyIPUEET      Area Code 336 Phone# [phone] * DOB [phone]    RE REFILL PAIN RX/STROKE PT/SEEN DR 2MONTHS AGO.     DID VERIFY DR NAME AND ADDRESS W/CALLER.ER PER CLLR  Disp:Y/N Y If Y = C/B If No Response In 20minutes

## 2015-10-03 NOTE — Telephone Encounter (Signed)
Unable to leave message on pts cell nmber. Vm not set up.

## 2015-10-04 ENCOUNTER — Telehealth: Payer: Self-pay | Admitting: *Deleted

## 2015-10-04 NOTE — Telephone Encounter (Signed)
Yes. She has Child psychotherapistsocial worker to help her for that. Please ask her to contact social worker to work that out. Thanks.  Marvel PlanJindong Misha Antonini, MD PhD Stroke Neurology 10/04/2015 1:02 PM

## 2015-10-04 NOTE — Telephone Encounter (Signed)
If patient calls back she has not schedule an appt with Dr. Roda ShuttersXu per notes. Pt needs to see PCP about letter or a Child psychotherapistsocial worker at [employer].

## 2015-10-04 NOTE — Telephone Encounter (Signed)
C                  Taken 26-MAY-17 at  9:09AM by JPO ------------------------------------------------------------ Caller [facility]  CID [id number]  Patient Tanya BerlinLATONYA Demattia       Pt's Dr Roda ShuttersXU           Area Code 336 Phone# [phone] * DOB [phone]     RE NEEDS HELP LIVES ON [address]/NO ELEVATOR/WILL      MOVE HER WITH NOTE FROM DR/URGENT TO CALL BACK       Disp:Y/N Y If Y = C/B If No Response In 20minutes ============================================================

## 2015-10-21 ENCOUNTER — Telehealth: Payer: Self-pay | Admitting: Neurology

## 2015-10-21 ENCOUNTER — Encounter: Payer: Self-pay | Admitting: Neurology

## 2015-10-21 DIAGNOSIS — I63133 Cerebral infarction due to embolism of bilateral carotid arteries: Secondary | ICD-10-CM

## 2015-10-21 NOTE — Telephone Encounter (Signed)
Letter for landlord from Dr. Roda ShuttersXu fax to [phone]. Letter fax twice and confirm. Orders for PT and OT done for patient.

## 2015-10-21 NOTE — Telephone Encounter (Signed)
[facility] in regards to pts hospital order for home health PT, OT and Aid. She has not received any of this. Pt also lives on the third floor and needs a letter for landlord for her to get a bottom floor apt. With out steps Landlord fax: [phone] attn: Cooper RenderLisa Petri, no phone number for landlord Phone: [phone] Westley HummerCharlene will be out of the office from Tuesday for two weeks  [phone] office # ext: [phone]

## 2015-10-21 NOTE — Telephone Encounter (Signed)
I have completed the letter to her landlord and I will ask [name] to fax over. I will also reorder her home PT/OT/nursing aid. Please let her know. Thanks.  Tanya Mayer Ripken Rekowski, MD PhD Stroke Neurology 10/21/2015 3:43 PM  Orders Placed This Encounter  Procedures  . Ambulatory referral to Home Health    Referral Priority:  Routine    Referral Type:  Home Health Care    Referral Reason:  Specialty Services Required    Requested Specialty:  Home Health Services    Number of Visits Requested:  1

## 2015-10-22 NOTE — Telephone Encounter (Signed)
[facility] per referral notes on 10/20/2015.

## 2015-10-23 ENCOUNTER — Emergency Department (HOSPITAL_COMMUNITY): Payer: Medicaid Other

## 2015-10-23 ENCOUNTER — Encounter (HOSPITAL_COMMUNITY): Payer: Self-pay | Admitting: Emergency Medicine

## 2015-10-23 ENCOUNTER — Emergency Department (HOSPITAL_COMMUNITY)
Admission: EM | Admit: 2015-10-23 | Discharge: 2015-10-23 | Disposition: A | Discharge status: Home / Self Care | Payer: Medicaid Other | Attending: Emergency Medicine | Admitting: Emergency Medicine

## 2015-10-23 DIAGNOSIS — F17210 Nicotine dependence, cigarettes, uncomplicated: Secondary | ICD-10-CM | POA: Insufficient documentation

## 2015-10-23 DIAGNOSIS — Z7901 Long term (current) use of anticoagulants: Secondary | ICD-10-CM | POA: Insufficient documentation

## 2015-10-23 DIAGNOSIS — Z8673 Personal history of transient ischemic attack (TIA), and cerebral infarction without residual deficits: Secondary | ICD-10-CM | POA: Insufficient documentation

## 2015-10-23 DIAGNOSIS — Z794 Long term (current) use of insulin: Secondary | ICD-10-CM | POA: Insufficient documentation

## 2015-10-23 DIAGNOSIS — R51 Headache: Secondary | ICD-10-CM | POA: Diagnosis not present

## 2015-10-23 DIAGNOSIS — Z79899 Other long term (current) drug therapy: Secondary | ICD-10-CM | POA: Insufficient documentation

## 2015-10-23 DIAGNOSIS — R531 Weakness: Secondary | ICD-10-CM | POA: Insufficient documentation

## 2015-10-23 DIAGNOSIS — Z7982 Long term (current) use of aspirin: Secondary | ICD-10-CM | POA: Diagnosis not present

## 2015-10-23 DIAGNOSIS — I10 Essential (primary) hypertension: Secondary | ICD-10-CM | POA: Insufficient documentation

## 2015-10-23 DIAGNOSIS — E1151 Type 2 diabetes mellitus with diabetic peripheral angiopathy without gangrene: Secondary | ICD-10-CM | POA: Diagnosis not present

## 2015-10-23 DIAGNOSIS — M6289 Other specified disorders of muscle: Secondary | ICD-10-CM

## 2015-10-23 LAB — DIFFERENTIAL
BASOS ABS: 0 10*3/uL (ref 0.0–0.1)
Basophils Relative: 1 %
EOS ABS: 0.2 10*3/uL (ref 0.0–0.7)
EOS PCT: 3 %
Lymphocytes Relative: 42 %
Lymphs Abs: 2.2 10*3/uL (ref 0.7–4.0)
MONO ABS: 0.5 10*3/uL (ref 0.1–1.0)
Monocytes Relative: 9 %
NEUTROS PCT: 45 %
Neutro Abs: 2.4 10*3/uL (ref 1.7–7.7)

## 2015-10-23 LAB — CBC
HCT: 38.6 % (ref 36.0–46.0)
Hemoglobin: 12.7 g/dL (ref 12.0–15.0)
MCH: 28.4 pg (ref 26.0–34.0)
MCHC: 32.9 g/dL (ref 30.0–36.0)
MCV: 86.4 fL (ref 78.0–100.0)
PLATELETS: 202 10*3/uL (ref 150–400)
RBC: 4.47 MIL/uL (ref 3.87–5.11)
RDW: 13 % (ref 11.5–15.5)
WBC: 5.3 10*3/uL (ref 4.0–10.5)

## 2015-10-23 LAB — I-STAT CHEM 8, ED
BUN: 6 mg/dL (ref 6–20)
CREATININE: 1.2 mg/dL — AB (ref 0.44–1.00)
Calcium, Ion: 1.09 mmol/L — ABNORMAL LOW (ref 1.12–1.23)
Chloride: 104 mmol/L (ref 101–111)
Glucose, Bld: 118 mg/dL — ABNORMAL HIGH (ref 65–99)
HEMATOCRIT: 41 % (ref 36.0–46.0)
HEMOGLOBIN: 13.9 g/dL (ref 12.0–15.0)
POTASSIUM: 3.6 mmol/L (ref 3.5–5.1)
SODIUM: 141 mmol/L (ref 135–145)
TCO2: 25 mmol/L (ref 0–100)

## 2015-10-23 LAB — COMPREHENSIVE METABOLIC PANEL
ALBUMIN: 3.6 g/dL (ref 3.5–5.0)
ALT: 16 U/L (ref 14–54)
ANION GAP: 8 (ref 5–15)
AST: 21 U/L (ref 15–41)
Alkaline Phosphatase: 94 U/L (ref 38–126)
BILIRUBIN TOTAL: 0.2 mg/dL — AB (ref 0.3–1.2)
BUN: 6 mg/dL (ref 6–20)
CO2: 23 mmol/L (ref 22–32)
Calcium: 9.4 mg/dL (ref 8.9–10.3)
Chloride: 107 mmol/L (ref 101–111)
Creatinine, Ser: 1.31 mg/dL — ABNORMAL HIGH (ref 0.44–1.00)
GFR calc non Af Amer: 52 mL/min — ABNORMAL LOW (ref >60)
GLUCOSE: 125 mg/dL — AB (ref 65–99)
POTASSIUM: 3.3 mmol/L — AB (ref 3.5–5.1)
SODIUM: 138 mmol/L (ref 135–145)
TOTAL PROTEIN: 6.7 g/dL (ref 6.5–8.1)

## 2015-10-23 LAB — PROTIME-INR
INR: 1.14 (ref 0.00–1.49)
PROTHROMBIN TIME: 14.8 s (ref 11.6–15.2)

## 2015-10-23 LAB — CBG MONITORING, ED: Glucose-Capillary: 102 mg/dL — ABNORMAL HIGH (ref 65–99)

## 2015-10-23 LAB — I-STAT TROPONIN, ED: Troponin i, poc: 0.01 ng/mL (ref 0.00–0.08)

## 2015-10-23 LAB — ETHANOL: Alcohol, Ethyl (B): <5 mg/dL (ref <5)

## 2015-10-23 LAB — APTT: APTT: 32 s (ref 24–37)

## 2015-10-23 MED ORDER — DIPHENHYDRAMINE HCL 50 MG/ML IJ SOLN
25.0000 mg | Freq: Once | INTRAMUSCULAR | Status: AC
  Administered 2015-10-23: 25 mg via INTRAVENOUS
  Filled 2015-10-23: qty 1

## 2015-10-23 MED ORDER — PROCHLORPERAZINE EDISYLATE 5 MG/ML IJ SOLN
10.0000 mg | Freq: Once | INTRAMUSCULAR | Status: AC
  Administered 2015-10-23: 10 mg via INTRAVENOUS
  Filled 2015-10-23: qty 2

## 2015-10-23 NOTE — ED Notes (Signed)
MRI negative per Dr. Desmond Lopeurk. Canceled code stroke

## 2015-10-23 NOTE — ED Notes (Signed)
Moves all extremities at time of discharge

## 2015-10-23 NOTE — ED Notes (Addendum)
Pt arrives as code stroke, LSK 0145. Sudden L sided weakness, facial droop slurred speech. Presentation changed with EMS depending on what they asked her to do - flaccid after EMS reports seeing her move her L side. HX CVA with L sided deficits.

## 2015-10-23 NOTE — Discharge Instructions (Signed)
Hypotonia Hypotonia is decreased muscle tone. Muscle tone is the amount of tension or resistance to movement in a muscle while at rest. Muscle tone is different from muscle strength, which is how much force a muscle can apply. Often, a person with hypotonia will also have weak muscles. Hypotonia is often a symptom of a serious underlying condition or a problem at birth. Hypotonia can be a short-term condition, such as when a baby is born prematurely, or a lifelong (chronic) condition. Hypotonic infants are referred to as "floppy" infants because of their abnormally limp bodies and lack of control over their movements. CAUSES  In some cases, the cause of this condition is not known. Possible causes include:   Injury or damage (trauma). Hypotonia is often caused by trauma that occurred before, during, or right after birth (perinataltrauma).   Genetic disorders.  Nerve disorders.  Muscle disorders.  Central nervous system (CNS) disorders.  Problems in the connections between nerves and muscles (neuromuscular junctions).  Problems in the tissues that connect bones to one another (ligaments).  Serious infections.  Premature birth. SYMPTOMS  Symptoms of this condition vary based on the cause and the patient's age. The main symptoms include:   Low muscle tone in the entire body or in specific areas of the body.  Joints that are unusually flexible. This is often seen in the hips, elbows, and knees.  Poor reflexes.  Muscle weakness. Symptoms in Infants  Limp or "floppy" movements.  Little to no control of the neck muscles.  Inability to place weight on the limbs.  Limbs than hang straight down instead of bending at the joints.  Delayed development, especially with actions that involve the muscles (motor skills).  Poor sucking ability and poor weight gain.  Decreased alertness. Symptoms in Older Children and Adults  Weight loss.   Difficulty getting up and reaching for  objects.  Double vision.   Decreased energy.   Poor posture and balance. Depending on the cause, symptoms may improve, stay the same, or get worse over time. DIAGNOSIS  This condition is often diagnosed during infancy, but sometimes it can develop in older children and adults. Diagnosis is based on a physical exam and medical history. You may have tests, including:   MRI.  CT scan.  Electromyogram with nerve conduction studies (EMG with NCS). This evaluates the function of the nerves, neuromuscular junctions, and muscles.   Electroencephalogram (EEG). This records brain activity.  Removal of a small number of muscle or nerve cells that are examined under a microscope (biopsy).  Genetic testing. You may be given the name of a health care provider who specializes in CNS disorders (neurologist). TREATMENT  Treatment for hypotonia depends on the cause. Treatment options include:   Treating the underlying condition that causes your hypotonia, if this applies.  Physical therapy to improve motor skills and functional strength.  Occupational therapy to improve skills needed for everyday activities, such as fine motor skills (dexterity).  Speech-language therapy to overcome difficulty swallowing and talking. HOME CARE INSTRUCTIONS  Take over-the-counter and prescription medicines only as told by your health care provider.  Pay attention to any changes in your symptoms.  Use equipment, such as braces or wheelchairs, as told by your health care provider.  If you feel weak or unstable, sit or lie down right away.  Ask your health care provider what activities are safe for you. You may be told to avoid certain physical activities.  Ask for help when you need it, such  as when lifting heavy objects or reaching for things. °· Keep all follow-up visits as told by your health care provider. This is important. °SEEK MEDICAL CARE IF: °· You have side effects from any medicines you are  taking. °· Your symptoms suddenly change or get worse. °· You have unusual stiffness. °· You have a sudden change in mood or behavior. °· You feel hopeless or depressed. °SEEK IMMEDIATE MEDICAL CARE IF: °· You have difficulty breathing. °· You have difficulty seeing or your vision changes. °· You have numbness in part of your body. °  °This information is not intended to replace advice given to you by your health care provider. Make sure you discuss any questions you have with your health care provider. °  °Document Released: 04/17/2002 Document Revised: 01/16/2015 Document Reviewed: 09/12/2014 °Elsevier Interactive Patient Education ©2016 [employer] ° °

## 2015-10-23 NOTE — Consult Note (Signed)
Neurology Consultation Reason for Consult:code stroke Referring Physician: Dr Blinda Leatherwood    HPI: Tanya Mayer is a 35 y.o. female with history of multiple strokes, large PFO, tobacco use, HTN and multiple psychiatric diseases presenting with worsening left sided weakness and speech abnormalities. Code stroke was activated at 2:25 AM. When patient arrived in the ED EMS reported on initial examination she was standing and speaking fine but when they examined that she completely went flaccid on the left side. During my initial neurological exam she seems to have a functional component/weakness of left upper extremity, holding her arm on her face and letting it go she would slide her arm on the side. CT scan of the head did not show any acute abnormalities but due to discrepancy in her neurological exam urgent MRI was performed which is negative for acute stroke She did not receive IV t-PA. Initial NIH score reported 13 Previously patient was diagnosed with conversion disorder however she did had strokes in the past. 01/03/2015 MRI of brain showed 2 punctate infarcts in the left frontal region. 02/27/2015 MRI brain showed punctate infarct in the right frontal and left frontal region. 03/19/2015 punctate infarct in the left frontal region. 09/25/2015 punctate infarct in the posterior temporal region. In the past MRA of the head did not show any significant stenosis. Patient is supposed to be on aspirin and Plavix and Lipitor. She continues to smoke   Past Medical History  Diagnosis Date  . Hypertension   . Heart murmur     "born w/one"  . Hyperlipemia   . Anginal pain (HCC)   . DVT (deep venous thrombosis) (HCC) after 2008    "BLE"  . Bronchial asthma   . Anemia   . GERD (gastroesophageal reflux disease)   . Migraine     "weekly" (03/19/2015)  . Chronic lower back pain   . Bipolar disorder (HCC)   . Schizophrenia (HCC)   . DM (diabetes mellitus) type II uncontrolled, periph vascular  disorder (HCC)     notes 01/03/2015  . Seizures (HCC)     epilepsy/notes 02/27/2015  . Stroke (HCC) 04/2014; 09/2014(/notes 04/25/2015);  12/2014; 01/2015; 02/2015    "left side weakness; speech problems since" (03/19/2015)  . Stroke (HCC) 03/19/2015    "left side weakness; speech problems" (03/19/2015)  . PFO (patent foramen ovale)     large, per TEE 03/01/2015   Past Surgical History  Procedure Laterality Date  . 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 Tee without cardioversion N/A 03/01/2015    Procedure: TRANSESOPHAGEAL ECHOCARDIOGRAM (TEE); Surgeon: Thurmon Fair, MD; Location: [facility]; Service: Cardiovascular; Laterality: N/A;  . Hernia repair    . Umbilical hernia repair  "@ birth"  . Cesarean section  2008  . Tubal ligation  2008   Medication  acetaminophen 500 MG tablet  Commonly known as: TYLENOL  Take 500 mg by mouth every 6 (six) hours as needed for moderate pain.      albuterol 108 (90 Base) MCG/ACT inhaler  Commonly known as: PROVENTIL HFA;VENTOLIN HFA  Inhale 2 puffs into the lungs every 6 (six) hours as needed for wheezing or shortness of breath.     ALPRAZolam 1 MG tablet  Commonly known as: XANAX  Take 1 tablet (1 mg total) by mouth 2 (two) times daily as needed for anxiety.     aspirin 325 MG EC tablet  Take 1 tablet (325 mg total) by mouth daily.     atorvastatin 40 MG  tablet  Commonly known as: LIPITOR  Take 1 tablet (40 mg total) by mouth daily at 6 PM.     clopidogrel 75 MG tablet  Commonly known as: PLAVIX  Take 1 tablet (75 mg total) by mouth daily.     diphenhydramine-acetaminophen 25-500 MG Tabs tablet  Commonly known as: TYLENOL PM  Take 1 tablet by mouth at bedtime as needed (sleep).     famotidine 20 MG tablet  Commonly known as: PEPCID  Take 1 tablet (20 mg total) by mouth 2 (two) times daily.     gabapentin 300 MG capsule  Commonly known as: NEURONTIN   Take 3 capsules (900 mg total) by mouth 3 (three) times daily.     hydrOXYzine 50 MG capsule  Commonly known as: VISTARIL  Take 1 capsule (50 mg total) by mouth at bedtime.     insulin detemir 100 UNIT/ML injection  Commonly known as: LEVEMIR  Inject 0.1 mLs (10 Units total) into the skin at bedtime.     QUEtiapine 300 MG tablet  Commonly known as: SEROQUEL  Take 1 tablet (300 mg total) by mouth at bedtime.     risperiDONE 0.25 MG tablet  Commonly known as: RISPERDAL  Take 1 tablet (0.25 mg total) by mouth 2 (two) times daily.     trazodone 300 MG tablet  Commonly known as: DESYREL  Take 1 tablet (300 mg total) by mouth at bedtime.          Family History  Problem Relation Age of Onset  . Hypertension Mother   . Clotting disorder Mother     hypercoagulable disorder on mother side  . Heart attack Mother    Social History  Substance Use Topics  . Smoking status: Current Every Day Smoker -- 0.33 packs/day for 21 years    Types: Cigarettes  . Smokeless tobacco: Never Used  . Alcohol Use: 0.0 oz/week    0 Standard drinks or equivalent per week     Comment: 03/19/2015 "a couple times/month"   OB History        Review of system Currently she complains of headache and left-sided weakness and numbness but denies any chest pain palpitation abdominal pain fever or chills night sweats no change in bowel or urinary habits  Exam: Current vital signs: Temperature 97.8 Pulse rate 89 Respiration 18 Blood pressure 130/ 80    Physical Exam  Constitutional: Appears well-developed and well-nourished.  Psych: Affect appropriate to situation Eyes: No scleral injection HENT: No OP obstrucion Head: Normocephalic.  Cardiovascular: Normal rate and regular rhythm. Systolic murmur Respiratory: Effort normal and breath sounds normal to anterior ascultation GI: Soft.  No distension. There is no tenderness.   Skin: WDI  Neuro: Mental Status: Patient is awake, alert, oriented to person, place, month, year, and situation. Slurred speech Cranial Nerves: II: Visual Fields are full. Pupils are equal, round, and reactive to light.   III,IV, VI: EOMI without ptosis or diploplia.  V: Facial sensation is symmetric to temperature VII: Left facial droop VIII: hearing is intact to voice X: Uvula elevates symmetrically XI: Shoulder shrug is symmetric. XII: tongue is midline without atrophy or fasciculations.  Motor: Tone is normal. Bulk is normal. Left upper extremity 0 out of 5 but seems to have a functional component poor effort. Similar findings in left lower extremity. Right side seems to be within normal limits  Sensory: Unable to feel anything on the left side Plantars: Toes are downgoing bilaterally.  Cerebellar: FNF on the right  side is intact  I have reviewed the images obtained: Emergent MRI was performed which is negative for acute stroke  Assessment and recommendation 35 year old African-American right-handed female with history of multiple strokes, large PFO, tobacco use, HTN and multiple psychiatric diseases presenting with worsening left sided weakness and speech abnormalities Patient neurological exam and diagnostic studies is consistent with psychogenic weakness. Continue aspirin and Plavix and statin therapy for long-term stroke prevention. Smoking cessation is recommended. Findings were discussed in detail with the ED physician   Cy Blamer, MD 3:33 AM

## 2015-10-23 NOTE — ED Notes (Signed)
Patient left at this time with all belongings. 

## 2015-10-23 NOTE — ED Notes (Signed)
This RN transported patient to MRI with Desmond LopeURK MD and Wes RR RN

## 2015-10-23 NOTE — ED Notes (Signed)
Code Stroke cancelled @ [phone]

## 2015-10-23 NOTE — ED Provider Notes (Signed)
CSN: [id number]     Arrival date & time 10/23/15  0242 History  By signing my name below, I, Levon Hedger, attest that this documentation has been prepared under the direction and in the presence of Gilda Crease, MD . Electronically Signed: Levon Hedger, Scribe. 10/23/2015. 4:05 AM.    Chief Complaint  Patient presents with  . Code Stroke   The history is provided by the patient. No language interpreter was used.  HPI Comments:  Rolonda Pontarelli is a 35 y.o. female with PMHx of HTN, CVA, bipolar, schizophrenia, DMII and migraines brought in by ambulance, who presents to the Emergency Department complaining of sudden onset, left sided weakness with associated slurred speech, and headache. Pt states symptoms started at 1:45 this AM while at rest.  Pt states as experienced similar symptoms before during prior CVA. Per EMS report, pt was able to move left side upon arrival and then changed presentation. There are no other complaints.    Past Medical History  Diagnosis Date  . Hypertension   . Heart murmur     "born w/one"  . Hyperlipemia   . Anginal pain (HCC)   . DVT (deep venous thrombosis) (HCC) after 2008    "BLE"  . Bronchial asthma   . Anemia   . GERD (gastroesophageal reflux disease)   . Migraine     "weekly" (03/19/2015)  . Chronic lower back pain   . Bipolar disorder (HCC)   . Schizophrenia (HCC)   . DM (diabetes mellitus) type II uncontrolled, periph vascular disorder (HCC)     notes 01/03/2015  . Seizures (HCC)     epilepsy/notes 02/27/2015  . Stroke (HCC) 04/2014; 09/2014(/notes 04/25/2015);  12/2014; 01/2015; 02/2015    "left side weakness; speech problems since" (03/19/2015)  . Stroke (HCC) 03/19/2015    "left side weakness; speech problems" (03/19/2015)  . PFO (patent foramen ovale)     large, per TEE 03/01/2015   Past Surgical History  Procedure Laterality Date  . 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 Tee without cardioversion N/A 03/01/2015    Procedure:  TRANSESOPHAGEAL ECHOCARDIOGRAM (TEE);  Surgeon: Thurmon Fair, MD;  Location: [facility] ENDOSCOPY;  Service: Cardiovascular;  Laterality: N/A;  . Hernia repair    . Umbilical hernia repair  "@ birth"  . Cesarean section  2008  . Tubal ligation  2008   Family History  Problem Relation Age of Onset  . Hypertension Mother   . Clotting disorder Mother     hypercoagulable disorder on mother side  . Heart attack Mother    Social History  Substance Use Topics  . Smoking status: Current Every Day Smoker -- 0.33 packs/day for 21 years    Types: Cigarettes  . Smokeless tobacco: Never Used  . Alcohol Use: 0.0 oz/week    0 Standard drinks or equivalent per week     Comment: 03/19/2015 "a couple times/month"   OB History    No data available     Review of Systems  Neurological: Positive for speech difficulty, weakness and headaches. Negative for facial asymmetry.  All other systems reviewed and are negative.     Allergies  Carrot; Eggs or egg-derived products; and Mushroom extract complex  Home Medications   Prior to Admission medications   Medication Sig Start Date End Date Taking? Authorizing Provider  acetaminophen (TYLENOL) 500 MG tablet Take 500 mg by mouth every 6 (six) hours as needed for moderate pain.   Yes Historical Provider,  MD  albuterol (PROVENTIL HFA;VENTOLIN HFA) 108 (90 Base) MCG/ACT inhaler Inhale 2 puffs into the lungs every 6 (six) hours as needed for wheezing or shortness of breath. 07/02/15  Yes Ripudeep Jenna Luo, MD  ALPRAZolam Prudy Feeler) 1 MG tablet Take 1 tablet (1 mg total) by mouth 2 (two) times daily as needed for anxiety. 07/02/15  Yes Ripudeep Jenna Luo, MD  aspirin EC 325 MG EC tablet Take 1 tablet (325 mg total) by mouth daily. 09/30/15  Yes Marvel Plan, MD  atorvastatin (LIPITOR) 40 MG tablet Take 1 tablet (40 mg total) by mouth daily at 6 PM. 09/30/15  Yes Marvel Plan, MD  clopidogrel (PLAVIX) 75 MG tablet Take 1 tablet (75 mg total) by mouth daily. 07/02/15  Yes Ripudeep Jenna Luo, MD  diphenhydramine-acetaminophen (TYLENOL PM) 25-500 MG TABS tablet Take 1 tablet by mouth at bedtime as needed (sleep).   Yes Historical Provider, MD  famotidine (PEPCID) 20 MG tablet Take 1 tablet (20 mg total) by mouth 2 (two) times daily. 07/02/15  Yes Ripudeep Jenna Luo, MD  gabapentin (NEURONTIN) 300 MG capsule Take 3 capsules (900 mg total) by mouth 3 (three) times daily. 07/02/15  Yes Ripudeep Jenna Luo, MD  hydrOXYzine (VISTARIL) 50 MG capsule Take 1 capsule (50 mg total) by mouth at bedtime. 07/02/15  Yes Ripudeep Jenna Luo, MD  insulin detemir (LEVEMIR) 100 UNIT/ML injection Inject 0.1 mLs (10 Units total) into the skin at bedtime. Patient taking differently: Inject 20 Units into the skin 3 (three) times daily.  07/02/15  Yes Ripudeep Jenna Luo, MD  QUEtiapine (SEROQUEL) 300 MG tablet Take 1 tablet (300 mg total) by mouth at bedtime. 07/02/15  Yes Ripudeep Jenna Luo, MD  risperiDONE (RISPERDAL) 0.25 MG tablet Take 1 tablet (0.25 mg total) by mouth 2 (two) times daily. 07/02/15  Yes Ripudeep Jenna Luo, MD  trazodone (DESYREL) 300 MG tablet Take 1 tablet (300 mg total) by mouth at bedtime. 07/02/15  Yes Ripudeep K Rai, MD   BP 142/103 mmHg  Pulse 65  Temp(Src) 97.8 F (36.6 C) (Oral)  Resp 21  Ht 5\' 3"  (1.6 m)  Wt 152 lb 1 oz (68.975 kg)  BMI 26.94 kg/m2  SpO2 100%  LMP 09/22/2015 Physical Exam  Constitutional: She is oriented to person, place, and time. She appears well-developed and well-nourished. No distress.  HENT:  Head: Normocephalic and atraumatic.  Right Ear: Hearing normal.  Left Ear: Hearing normal.  Nose: Nose normal.  Mouth/Throat: Oropharynx is clear and moist and mucous membranes are normal.  Eyes: Conjunctivae and EOM are normal. Pupils are equal, round, and reactive to light.  Neck: Normal range of motion. Neck supple.  Cardiovascular: Regular rhythm, S1 normal and S2 normal.  Exam reveals no gallop and no friction rub.   No murmur heard. Pulmonary/Chest: Effort normal and breath  sounds normal. No respiratory distress. She exhibits no tenderness.  Abdominal: Soft. Normal appearance and bowel sounds are normal. There is no hepatosplenomegaly. There is no tenderness. There is no rebound, no guarding, no tenderness at McBurney's point and negative Murphy's sign. No hernia.  Musculoskeletal:  No tone or effort with movement of left extremities   Neurological: She is alert and oriented to person, place, and time. She has normal strength. No cranial nerve deficit or sensory deficit. Coordination normal. GCS eye subscore is 4. GCS verbal subscore is 5. GCS motor subscore is 6.  Skin: Skin is warm, dry and intact. No rash noted. No cyanosis.  Psychiatric: She  has a normal mood and affect. Her speech is normal and behavior is normal. Thought content normal.  Nursing note and vitals reviewed.   ED Course  Procedures  DIAGNOSTIC STUDIES:  Oxygen Saturation is 100% on RA, normal by my interpretation.    COORDINATION OF CARE:  4:05 AM Discussed treatment plan with pt at bedside and pt agreed to plan.  Labs Review Labs Reviewed  COMPREHENSIVE METABOLIC PANEL - Abnormal; Notable for the following:    Potassium 3.3 (*)    Glucose, Bld 125 (*)    Creatinine, Ser 1.31 (*)    Total Bilirubin 0.2 (*)    GFR calc non Af Amer 52 (*)    All other components within normal limits  I-STAT CHEM 8, ED - Abnormal; Notable for the following:    Creatinine, Ser 1.20 (*)    Glucose, Bld 118 (*)    Calcium, Ion 1.09 (*)    All other components within normal limits  CBG MONITORING, ED - Abnormal; Notable for the following:    Glucose-Capillary 102 (*)    All other components within normal limits  ETHANOL  PROTIME-INR  APTT  CBC  DIFFERENTIAL  URINE RAPID DRUG SCREEN, HOSP PERFORMED  URINALYSIS, ROUTINE W REFLEX MICROSCOPIC (NOT AT [facility])  I-STAT TROPOININ, ED    Imaging Review Ct Head Wo Contrast  10/23/2015  CLINICAL DATA:  Left-sided deficit, slurred speech, and left facial  droop. Code stroke. EXAM: CT HEAD WITHOUT CONTRAST TECHNIQUE: Contiguous axial images were obtained from the base of the skull through the vertex without intravenous contrast. COMPARISON:  MRI brain 09/25/2015.  CT head 09/24/2015. FINDINGS: Ventricles and sulci appear symmetrical. Small focal area of encephalomalacia in the right frontal lobe similar to prior study possibly representing old infarct. No ventricular dilatation. No mass effect or midline shift. No abnormal extra-axial fluid collections. Gray-white matter junctions are distinct. Basal cisterns are not effaced. No evidence of acute intracranial hemorrhage. No depressed skull fractures. Visualized paranasal sinuses and mastoid air cells are not opacified. IMPRESSION: No acute intracranial abnormalities. Possible old infarct focally in the right posterior frontal lobe. These results were called by telephone at the time of interpretation on 10/23/2015 at 2:54 am to Dr. Blinda Leatherwood, who verbally acknowledged these results.Phone number provided for Dr. Desmond Lope went to voice mail. Electronically Signed   By: Burman Nieves M.D.   On: 10/23/2015 02:56   Mr Brain Wo Contrast  10/23/2015  CLINICAL DATA:  Code stroke, acute onset LEFT-sided weakness and facial droop, slurred speech. History of stroke with LEFT-sided deficits. History of hypertension, hyperlipidemia, diabetes, stroke and seizures. EXAM: MRI HEAD WITHOUT CONTRAST TECHNIQUE: Multiplanar, multiecho pulse sequences of the brain and surrounding structures were obtained without intravenous contrast. COMPARISON:  CT HEAD October 13, 2015 at 0241 hours and MRI of the brain main seventeenth 2017 FINDINGS: INTRACRANIAL CONTENTS: No reduced diffusion to suggest acute ischemia. No susceptibility artifact to suggest hemorrhage. The ventricles and sulci are normal for patient's age. Small area RIGHT frontal encephalomalacia. No suspicious parenchymal signal, masses or mass effect. No abnormal extra-axial fluid  collections. No extra-axial masses though, contrast enhanced sequences would be more sensitive. Normal major intracranial vascular flow voids present at skull base. ORBITS: The included ocular globes and orbital contents are non-suspicious. SINUSES: The mastoid air-cells and included paranasal sinuses are well-aerated. SKULL/SOFT TISSUES: No abnormal sellar expansion. No suspicious calvarial bone marrow signal. Craniocervical junction maintained. IMPRESSION: No acute intracranial process. Old small RIGHT frontal lobe infarct. Electronically Signed  By: Awilda Metroourtnay  Bloomer M.D.   On: 10/23/2015 03:50   I have personally reviewed and evaluated these images and lab results as part of my medical decision-making.   EKG Interpretation None      MDM   Final diagnoses:  Left-sided weakness    Patient brought to the emergency department by EMS as a code stroke. Patient complaining of left-sided weakness. She does have a history of embolic stroke with left hemiparesis. Her examination today, however, is unusual. She does not have a facial droop, but holds her jaw twisted to the left side and will not open it to talk. This is causing her to have somewhat slurred speech. She is not making any effort to move the left side of her body. CT and MRI performed and both are negative for acute process. Neurology, Dr. Desmond Lopeurk, has seen the patient. It is felt that the patient's current presentation is secondary to conversion disorder.  I personally performed the services described in this documentation, which was scribed in my presence. The recorded information has been reviewed and is accurate.     Gilda Creasehristopher J Terelle Dobler, MD 10/23/15 [phone]

## 2015-10-23 NOTE — ED Notes (Signed)
MD at bedside. 

## 2015-11-05 ENCOUNTER — Encounter (HOSPITAL_COMMUNITY): Payer: Self-pay

## 2015-11-05 ENCOUNTER — Emergency Department (HOSPITAL_COMMUNITY): Payer: Medicaid Other

## 2015-11-05 ENCOUNTER — Emergency Department (HOSPITAL_COMMUNITY)
Admission: EM | Admit: 2015-11-05 | Discharge: 2015-11-05 | Disposition: A | Discharge status: Home / Self Care | Payer: Medicaid Other | Attending: Emergency Medicine | Admitting: Emergency Medicine

## 2015-11-05 DIAGNOSIS — Z8673 Personal history of transient ischemic attack (TIA), and cerebral infarction without residual deficits: Secondary | ICD-10-CM | POA: Insufficient documentation

## 2015-11-05 DIAGNOSIS — E1159 Type 2 diabetes mellitus with other circulatory complications: Secondary | ICD-10-CM | POA: Diagnosis not present

## 2015-11-05 DIAGNOSIS — F17210 Nicotine dependence, cigarettes, uncomplicated: Secondary | ICD-10-CM | POA: Insufficient documentation

## 2015-11-05 DIAGNOSIS — Z794 Long term (current) use of insulin: Secondary | ICD-10-CM | POA: Insufficient documentation

## 2015-11-05 DIAGNOSIS — I10 Essential (primary) hypertension: Secondary | ICD-10-CM | POA: Insufficient documentation

## 2015-11-05 DIAGNOSIS — G43909 Migraine, unspecified, not intractable, without status migrainosus: Secondary | ICD-10-CM | POA: Insufficient documentation

## 2015-11-05 DIAGNOSIS — Z79899 Other long term (current) drug therapy: Secondary | ICD-10-CM | POA: Insufficient documentation

## 2015-11-05 DIAGNOSIS — G43009 Migraine without aura, not intractable, without status migrainosus: Secondary | ICD-10-CM

## 2015-11-05 DIAGNOSIS — Z7982 Long term (current) use of aspirin: Secondary | ICD-10-CM | POA: Diagnosis not present

## 2015-11-05 DIAGNOSIS — Z7901 Long term (current) use of anticoagulants: Secondary | ICD-10-CM | POA: Insufficient documentation

## 2015-11-05 LAB — BASIC METABOLIC PANEL
ANION GAP: 7 (ref 5–15)
BUN: 8 mg/dL (ref 6–20)
CHLORIDE: 108 mmol/L (ref 101–111)
CO2: 20 mmol/L — AB (ref 22–32)
Calcium: 8.8 mg/dL — ABNORMAL LOW (ref 8.9–10.3)
Creatinine, Ser: 1.16 mg/dL — ABNORMAL HIGH (ref 0.44–1.00)
GFR calc Af Amer: >60 mL/min (ref >60)
GLUCOSE: 96 mg/dL (ref 65–99)
POTASSIUM: 6.7 mmol/L — AB (ref 3.5–5.1)
Sodium: 135 mmol/L (ref 135–145)

## 2015-11-05 LAB — POTASSIUM: Potassium: 3.6 mmol/L (ref 3.5–5.1)

## 2015-11-05 LAB — CBC
HEMATOCRIT: 39.6 % (ref 36.0–46.0)
HEMOGLOBIN: 12.9 g/dL (ref 12.0–15.0)
MCH: 28.4 pg (ref 26.0–34.0)
MCHC: 32.6 g/dL (ref 30.0–36.0)
MCV: 87 fL (ref 78.0–100.0)
PLATELETS: 176 10*3/uL (ref 150–400)
RBC: 4.55 MIL/uL (ref 3.87–5.11)
RDW: 13.5 % (ref 11.5–15.5)
WBC: 5.4 10*3/uL (ref 4.0–10.5)

## 2015-11-05 MED ORDER — CLOPIDOGREL BISULFATE 75 MG PO TABS: 75.0000 mg | ORAL_TABLET | Freq: Every day | ORAL | Status: DC

## 2015-11-05 MED ORDER — SODIUM CHLORIDE 0.9 % IV BOLUS (SEPSIS)
1000.0000 mL | Freq: Once | INTRAVENOUS | Status: AC
  Administered 2015-11-05: 1000 mL via INTRAVENOUS

## 2015-11-05 MED ORDER — DIPHENHYDRAMINE HCL 25 MG PO CAPS
25.0000 mg | ORAL_CAPSULE | Freq: Once | ORAL | Status: AC
  Administered 2015-11-05: 25 mg via ORAL
  Filled 2015-11-05: qty 1

## 2015-11-05 MED ORDER — PROCHLORPERAZINE EDISYLATE 5 MG/ML IJ SOLN
10.0000 mg | Freq: Once | INTRAMUSCULAR | Status: AC
Start: 2015-11-05 — End: 2015-11-05
  Administered 2015-11-05: 10 mg via INTRAVENOUS
  Filled 2015-11-05: qty 2

## 2015-11-05 MED ORDER — NAPROXEN 250 MG PO TABS
500.0000 mg | ORAL_TABLET | Freq: Once | ORAL | Status: AC
  Administered 2015-11-05: 500 mg via ORAL
  Filled 2015-11-05: qty 2

## 2015-11-05 MED ORDER — ACETAMINOPHEN 325 MG PO TABS
650.0000 mg | ORAL_TABLET | Freq: Once | ORAL | Status: AC
  Administered 2015-11-05: 650 mg via ORAL
  Filled 2015-11-05: qty 2

## 2015-11-05 NOTE — ED Notes (Signed)
Pt. Presents with complaint of migraine x 4 days. Pt. With hx of CVA, states left sided deficits at baseline. Reports no new deficits at this time. Pt. AxO x4.

## 2015-11-05 NOTE — Discharge Instructions (Signed)
Migraine Headache  A migraine headache is very bad, throbbing pain on one or both sides of your head. Talk to your doctor about what things may bring on (trigger) your migraine headaches.  HOME CARE  · Only take medicines as told by your doctor.  · Lie down in a dark, quiet room when you have a migraine.  · Keep a journal to find out if certain things bring on migraine headaches. For example, write down:    What you eat and drink.    How much sleep you get.    Any change to your diet or medicines.  · Lessen how much alcohol you drink.  · Quit smoking if you smoke.  · Get enough sleep.  · Lessen any stress in your life.  · Keep lights dim if bright lights bother you or make your migraines worse.  GET HELP RIGHT AWAY IF:   · Your migraine becomes really bad.  · You have a fever.  · You have a stiff neck.  · You have trouble seeing.  · Your muscles are weak, or you lose muscle control.  · You lose your balance or have trouble walking.  · You feel like you will pass out (faint), or you pass out.  · You have really bad symptoms that are different than your first symptoms.  MAKE SURE YOU:   · Understand these instructions.  · Will watch your condition.  · Will get help right away if you are not doing well or get worse.     This information is not intended to replace advice given to you by your health care provider. Make sure you discuss any questions you have with your health care provider.     Document Released: 02/04/2008 Document Revised: 07/20/2011 Document Reviewed: 01/02/2013  Elsevier Interactive Patient Education ©2016 [employer]

## 2015-11-05 NOTE — ED Notes (Signed)
Pt. Has history of dysphasia diet. PO meds ordered. Pt. Takes PO meds at home. MD made aware and approves of PO medications

## 2015-11-05 NOTE — ED Provider Notes (Signed)
CSN: [id number]     Arrival date & time 11/05/15  1453 History   First MD Initiated Contact with Patient 11/05/15 1511     Chief Complaint  Patient presents with  . Migraine    HPI Comments: 35 y.o. female with past medical history of CVA, PFO, schizophrenia, bipolar disorder, conversion disorder presents to the ED with headache consistent with her typical migraines for the last 4 days. She endorses 2 days of left arm weakness as well as left facial weakness. She reports she has had left arm weakness and left facial weakness before, report it is worse with this migraine. Denies pain anywhere else. She reports that this morning she took her last doses of medication, and is now out of all of her medications.  Patient is a 35 y.o. female presenting with migraines. The history is provided by the patient and medical records.  Migraine This is a new problem. The current episode started in the past 7 days (4 days ago). The problem occurs constantly. The problem has been unchanged. Associated symptoms include headaches and weakness. Pertinent negatives include no abdominal pain, chest pain, congestion, fever or rash. Nothing aggravates the symptoms. She has tried NSAIDs and rest for the symptoms. The treatment provided no relief.    Past Medical History  Diagnosis Date  . Hypertension   . Heart murmur     "born w/one"  . Hyperlipemia   . Anginal pain (HCC)   . DVT (deep venous thrombosis) (HCC) after 2008    "BLE"  . Bronchial asthma   . Anemia   . GERD (gastroesophageal reflux disease)   . Migraine     "weekly" (03/19/2015)  . Chronic lower back pain   . Bipolar disorder (HCC)   . Schizophrenia (HCC)   . DM (diabetes mellitus) type II uncontrolled, periph vascular disorder (HCC)     notes 01/03/2015  . Seizures (HCC)     epilepsy/notes 02/27/2015  . Stroke (HCC) 04/2014; 09/2014(/notes 04/25/2015);  12/2014; 01/2015; 02/2015    "left side weakness; speech problems since" (03/19/2015)  .  Stroke (HCC) 03/19/2015    "left side weakness; speech problems" (03/19/2015)  . PFO (patent foramen ovale)     large, per TEE 03/01/2015   Past Surgical History  Procedure Laterality Date  . 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 Tee without cardioversion N/A 03/01/2015    Procedure: TRANSESOPHAGEAL ECHOCARDIOGRAM (TEE);  Surgeon: Thurmon Fair, MD;  Location: [facility];  Service: Cardiovascular;  Laterality: N/A;  . Hernia repair    . Umbilical hernia repair  "@ birth"  . Cesarean section  2008  . Tubal ligation  2008   Family History  Problem Relation Age of Onset  . Hypertension Mother   . Clotting disorder Mother     hypercoagulable disorder on mother side  . Heart attack Mother    Social History  Substance Use Topics  . Smoking status: Current Every Day Smoker -- 0.33 packs/day for 21 years    Types: Cigarettes  . Smokeless tobacco: Never Used  . Alcohol Use: 0.0 oz/week    0 Standard drinks or equivalent per week     Comment: 03/19/2015 "a couple times/month"   OB History    No data available     Review of Systems  Constitutional: Negative for fever.  HENT: Negative for congestion.   Cardiovascular: Negative for chest pain.  Gastrointestinal: Negative for abdominal pain.  Genitourinary: Negative for flank pain.  Musculoskeletal: Negative for neck stiffness.  Skin: Negative for rash.  Neurological: Positive for weakness and headaches.  Psychiatric/Behavioral: Negative for confusion.      Allergies  Carrot; Eggs or egg-derived products; and Mushroom extract complex  Home Medications   Prior to Admission medications   Medication Sig Start Date End Date Taking? Authorizing Provider  acetaminophen (TYLENOL) 500 MG tablet Take 500 mg by mouth every 6 (six) hours as needed for moderate pain.    Historical Provider, MD  albuterol (PROVENTIL HFA;VENTOLIN HFA) 108 (90 Base) MCG/ACT inhaler Inhale 2 puffs into the lungs every 6 (six) hours as needed for wheezing or  shortness of breath. 07/02/15   Ripudeep Jenna LuoK Rai, MD  ALPRAZolam Prudy Feeler(XANAX) 1 MG tablet Take 1 tablet (1 mg total) by mouth 2 (two) times daily as needed for anxiety. 07/02/15   Ripudeep Jenna LuoK Rai, MD  aspirin EC 325 MG EC tablet Take 1 tablet (325 mg total) by mouth daily. 09/30/15   Marvel PlanJindong Xu, MD  atorvastatin (LIPITOR) 40 MG tablet Take 1 tablet (40 mg total) by mouth daily at 6 PM. 09/30/15   Marvel PlanJindong Xu, MD  clopidogrel (PLAVIX) 75 MG tablet Take 1 tablet (75 mg total) by mouth daily. 07/02/15   Ripudeep Jenna LuoK Rai, MD  diphenhydramine-acetaminophen (TYLENOL PM) 25-500 MG TABS tablet Take 1 tablet by mouth at bedtime as needed (sleep).    Historical Provider, MD  famotidine (PEPCID) 20 MG tablet Take 1 tablet (20 mg total) by mouth 2 (two) times daily. 07/02/15   Ripudeep Jenna LuoK Rai, MD  gabapentin (NEURONTIN) 300 MG capsule Take 3 capsules (900 mg total) by mouth 3 (three) times daily. 07/02/15   Ripudeep Jenna LuoK Rai, MD  hydrOXYzine (VISTARIL) 50 MG capsule Take 1 capsule (50 mg total) by mouth at bedtime. 07/02/15   Ripudeep Jenna LuoK Rai, MD  insulin detemir (LEVEMIR) 100 UNIT/ML injection Inject 0.1 mLs (10 Units total) into the skin at bedtime. Patient taking differently: Inject 20 Units into the skin 3 (three) times daily.  07/02/15   Ripudeep Jenna LuoK Rai, MD  QUEtiapine (SEROQUEL) 300 MG tablet Take 1 tablet (300 mg total) by mouth at bedtime. 07/02/15   Ripudeep Jenna LuoK Rai, MD  risperiDONE (RISPERDAL) 0.25 MG tablet Take 1 tablet (0.25 mg total) by mouth 2 (two) times daily. 07/02/15   Ripudeep Jenna LuoK Rai, MD  trazodone (DESYREL) 300 MG tablet Take 1 tablet (300 mg total) by mouth at bedtime. 07/02/15   Ripudeep Jenna LuoK Rai, MD   BP 136/85 mmHg  Pulse 92  Temp(Src) 98.5 F (36.9 C) (Oral)  Resp 18  SpO2 100%  LMP 10/31/2015 Physical Exam  Constitutional: She appears well-developed and well-nourished. She is cooperative. No distress.  HENT:  Head: Normocephalic and atraumatic.  Right Ear: External ear normal.  Left Ear: External ear normal.   Mouth/Throat: Oropharynx is clear and moist.  Holds her mouth curled to the left when not speaking; when speaking her mouth moves symmetrically and appropriately  Neck: Normal range of motion.  Cardiovascular: Normal rate and regular rhythm.   Pulmonary/Chest: Effort normal and breath sounds normal.  Abdominal: Soft. Bowel sounds are normal. She exhibits no distension. There is no tenderness.  Neurological: She is alert.  Puffs cheeks out equally, when asked to smile only raises right side of mouth; normal jaw clench bilaterally; does not raise either eyebrow when asked; normal strength in major muscle groups of right upper and right lower extremities; lists left leg off the bed when asked, no movement noted  in left arm  Skin: Skin is warm and dry. She is not diaphoretic.  Psychiatric: Her speech is normal.    ED Course  Procedures Labs Review Labs Reviewed  BASIC METABOLIC PANEL - Abnormal; Notable for the following:    Potassium 6.7 (*)    CO2 20 (*)    Creatinine, Ser 1.16 (*)    Calcium 8.8 (*)    All other components within normal limits  CBC  POTASSIUM    Imaging Review Mr Brain Wo Contrast  11/05/2015  CLINICAL DATA:  Migraine headache.  Left-sided deficits. EXAM: MRI HEAD WITHOUT CONTRAST TECHNIQUE: Multiplanar, multiecho pulse sequences of the brain and surrounding structures were obtained without intravenous contrast. COMPARISON:  MRI 10/23/2015 FINDINGS: Ventricle size normal.  Cerebral volume normal. Negative for acute infarct. Small hyperintensity right frontal cortex compatible with chronic ischemia is unchanged. No other areas of chronic ischemia. Cerebral white matter otherwise normal. Brainstem and cerebellum normal. Negative for hemorrhage or mass lesion. No shift of the midline structures Mild mucosal edema in the paranasal sinuses. Normal orbital contents. Pituitary not enlarged. IMPRESSION: No acute abnormality Small chronic infarct right frontal lobe unchanged.  Electronically Signed   By: Marlan Palauharles  Clark M.D.   On: 11/05/2015 17:53   I have personally reviewed and evaluated these images and lab results as part of my medical decision-making.   EKG Interpretation None      MDM   Final diagnoses:  Nonintractable migraine, unspecified migraine type    35 y.o. female presents ED with headache, consistent with prior migraines. She reported no new neuro deficits to the nurse at triage, however she tells me that for 2 days she's had worsening left arm weakness, left facial weakness. Her neuro exam is inconsistent. She has normal strength with puffing out her cheeks, however when asked to smile she does not raise the left side of her face. She does not raise the upper portion of her face at all above her eyebrows bilaterally when asked, however when speaking she has a symmetric face.   Migraine cocktail was ordered for the patient. Given her history of CVA, an MRI was obtained and showed no acute changes.  On reassessment the patient continues to endorse headache. Naproxen was ordered for the patient. On reassessment the patient feels comfortable going home, and is ambulatory with no assistance. She is to follow-up with her PCP. Of note she reports that she is out of all of her medications. Reviewing her records shows that she has refills on almost all of her medications. I will refill her Plavix until she is able to see her PCP.  Strict return precautions were discussed and given writing. She is to follow-up with her PCP. Discharged home.  Case managed in conjunction with my attending, Dr. Jodi MourningZavitz.   Maxine GlennAnn Fielding Mault, MD 11/05/15 [id number]  Blane OharaJoshua Zavitz, MD 11/05/15 2356

## 2015-11-06 NOTE — Telephone Encounter (Signed)
Rn call Vernona RiegerLaura at case Production designer, theatre/television/filmmanager with community care.Rn stated if she got a valid address on the patient to [facility]. Vernona RiegerLaura gave a current address that's listed in the computer.Vernona RiegerLaura stated the landlord gave another fax number for the letter to be sent. Vernona RiegerLaura fax the letter written by Dr. Roda ShuttersXu to her landlord. Rn gave Vernona RiegerLaura pts appt time and date. Rn explain patient needs to check in at 0830 and that [facility] close at 1200 on fridays. Vernona RiegerLaura stated she will work on getting pt transportation to the appt.[facility] for [facility], OT,and home health aide.

## 2015-11-06 NOTE — Telephone Encounter (Signed)
Andris BaumannLaura Meyer will be in meeting from 11am-12pm.

## 2015-11-06 NOTE — Telephone Encounter (Signed)
Rn talk to laurie case Production designer, theatre/television/filmmanager for patient. Rn stated the letter was fax to landlord and referral [facility]. Rn stated [facility] back because pts address was not valid along with her phone number. Rn stated pt does not have PCP. Vernona RiegerLaura stated she will be discussing with pt today about finding a PCP. Rn stated we just handle the stroke and neuro issues in outpatient. Vernona RiegerLaura wanted a copy of the letter that was fax to the landlord. Vernona RiegerLaura gave fax of [phone]. Vernona RiegerLaura also stated she will call patient about her current address and gave pts new number of [phone]. Landlord letter fax to Vernona RiegerLaura and receive.

## 2015-11-06 NOTE — Telephone Encounter (Signed)
Andris BaumannLaura Meyer, Care Manager/Partnership for [facility] [phone] called to advise, patient has been advised letter for Landlord has not been received, [facility] [place] not [place] referral. Vernona RiegerLaura advised [facility] not [facility], will cover only for nurse, recommends [facility].

## 2015-11-06 NOTE — Telephone Encounter (Signed)
LFt vm for Tanya Mayer case Production designer, theatre/television/filmmanager about message. Rn left vm that letter to landlord was fax on October 21, 2015 and confirm.. Also the [facility] referral coordinator.

## 2015-11-07 ENCOUNTER — Telehealth: Payer: Self-pay | Admitting: Neurology

## 2015-11-07 NOTE — Telephone Encounter (Signed)
[facility] telephone [phone] fax [phone]  Evaluate to home home health Nursing. physical therapy and occupational therapy.

## 2015-11-14 ENCOUNTER — Telehealth: Payer: Self-pay | Admitting: Neurology

## 2015-11-14 NOTE — Telephone Encounter (Signed)
[facility] called and will be sending PT out to pts home. If they need orders they will fax them . Phone: [phone]

## 2015-11-14 NOTE — Telephone Encounter (Signed)
Rn call Morrie Sheldonshley at [facility] about PT orders. Morrie Sheldonshley stated they will start therapy this week and will fax orders for signature. Rn stated MD works at hospital and will sign once they are fax. Morrie Sheldonshley verbalized understanding.

## 2015-12-06 ENCOUNTER — Ambulatory Visit: Payer: Medicaid Other | Admitting: Neurology

## 2015-12-06 ENCOUNTER — Telehealth: Payer: Self-pay

## 2015-12-06 NOTE — Telephone Encounter (Signed)
Patient was a no show for hospital follow up. No new orders will be given for PT and OT until patient makes an appt with Dr. Roda Shutters. IF patient wants a letter for her landlord she will have to seek her PCP for this issue.

## 2015-12-09 ENCOUNTER — Encounter: Payer: Self-pay | Admitting: Neurology

## 2017-03-11 IMAGING — MR MR HEAD W/O CM
9 of 10 series · 36 of 48 positions shown · non-contrast
Comparison: MRI 10/23/2015

CLINICAL DATA: Migraine headache.  Left-sided deficits.

EXAM:
MRI HEAD WITHOUT CONTRAST
TECHNIQUE: Multiplanar, multiecho pulse sequences of the brain and surrounding
structures were obtained without intravenous contrast.

[Series 2: FLAIR · sagittal · 5.0mm · 0.47mm/px · 2 of 23 slices shown (1 of 2)]
[im 1/23]
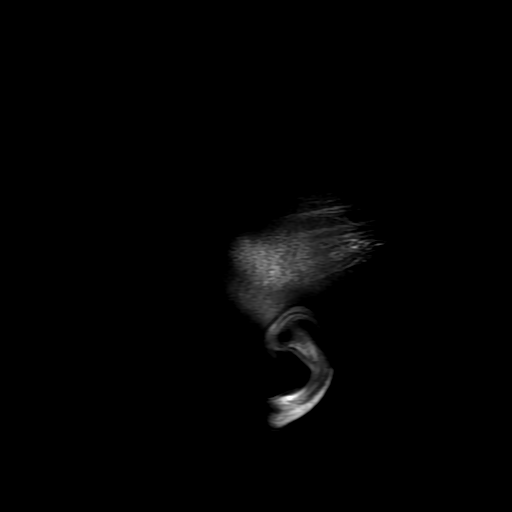
[im 23/23]
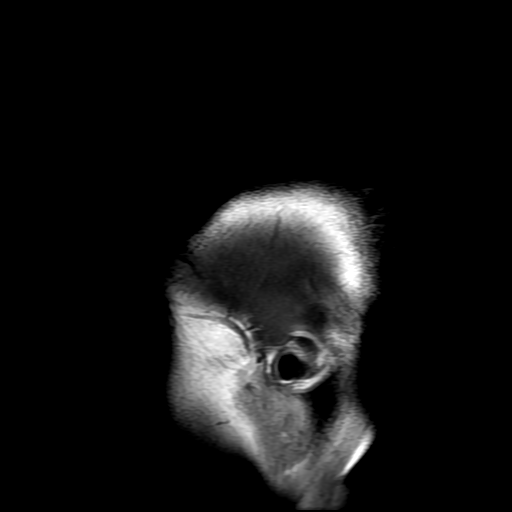

[Series 4: DWI · axial · 3.0mm · 0.94mm/px · z∈[-102,+27]mm · 9 of 90 slices shown (1 of 2)]
[im 1/90]
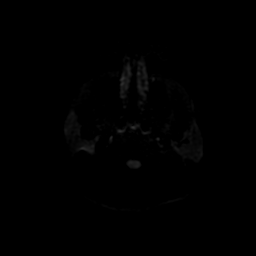
[im 12/90]
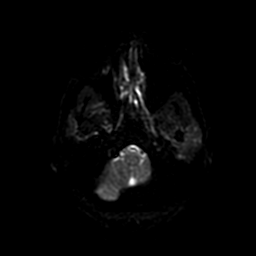
[im 23/90]
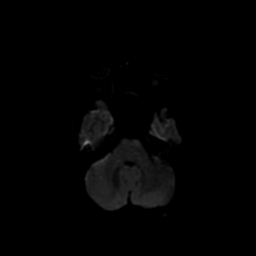
[im 34/90]
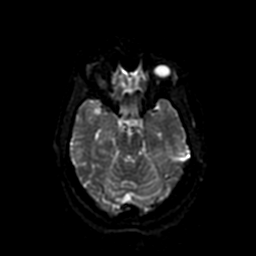
[im 45/90]
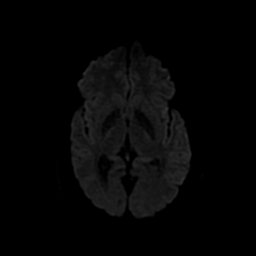
[im 56/90]
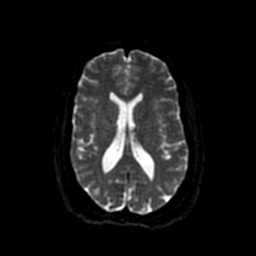
[im 67/90]
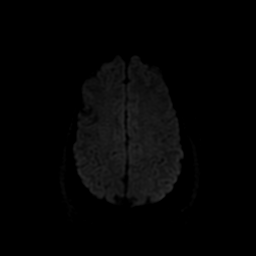
[im 78/90]
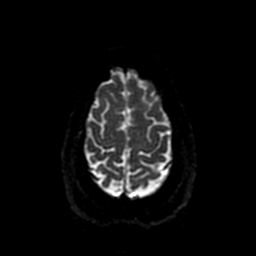
[im 90/90]
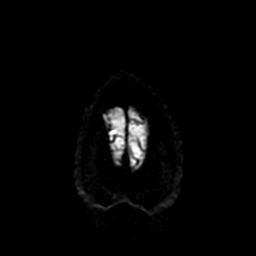

[Series 6: DWI · coronal · 4.0mm · 0.94mm/px · 7 of 66 slices shown (2 of 2)]
[im 1/66]
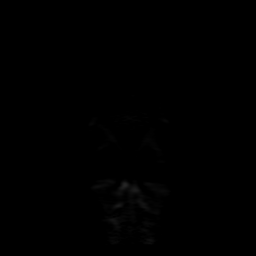
[im 11/66]
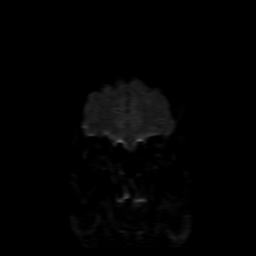
[im 22/66]
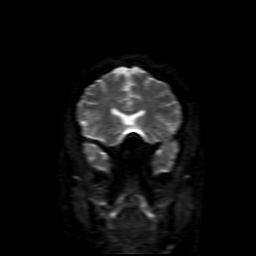
[im 33/66]
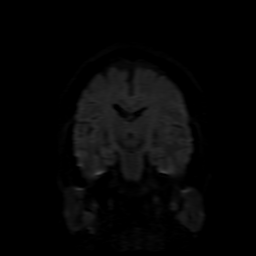
[im 44/66]
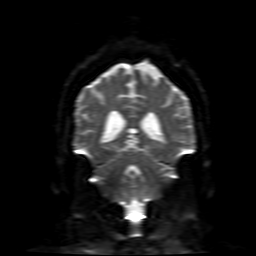
[im 55/66]
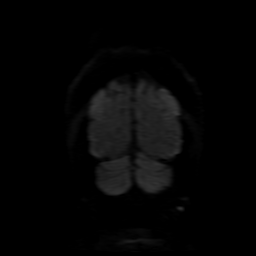
[im 66/66]
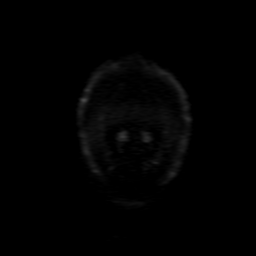

[Series 8: T2 · axial · 5.0mm · 0.47mm/px · z∈[-117,+16]mm · 2 of 24 slices shown (1 of 2)]
[im 1/24]
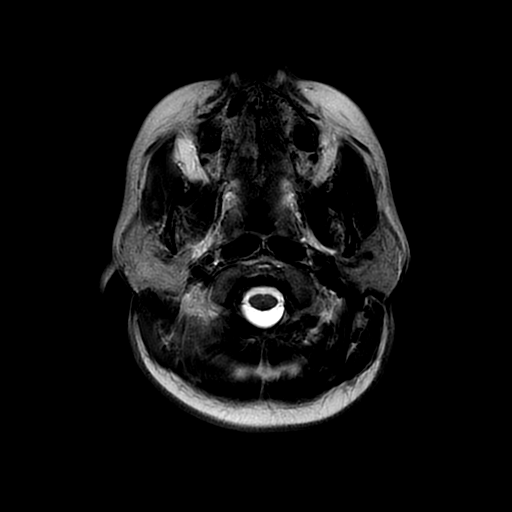
[im 24/24]
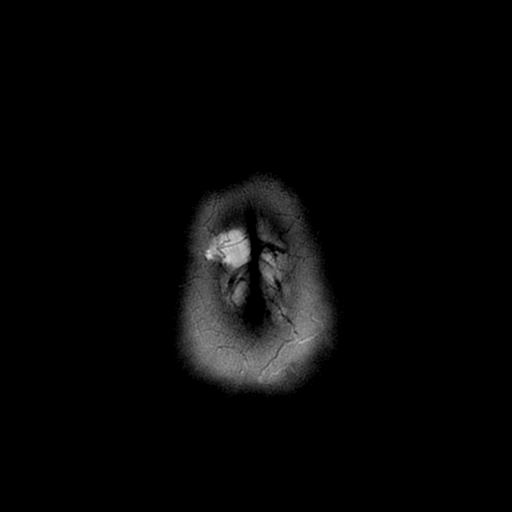

[Series 9: FLAIR · axial · 5.0mm · 0.47mm/px · z∈[-117,+16]mm · 2 of 24 slices shown (2 of 2)]
[im 1/24]
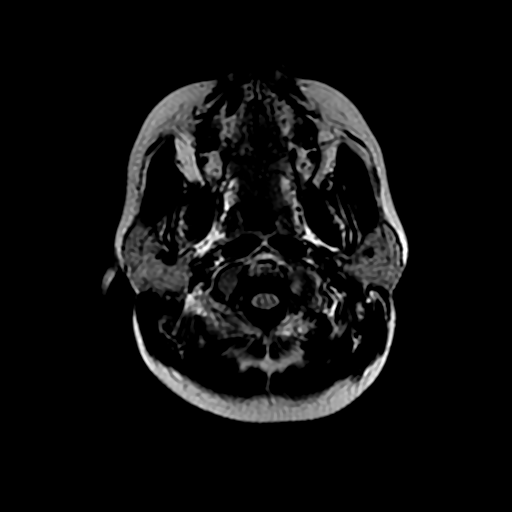
[im 24/24]
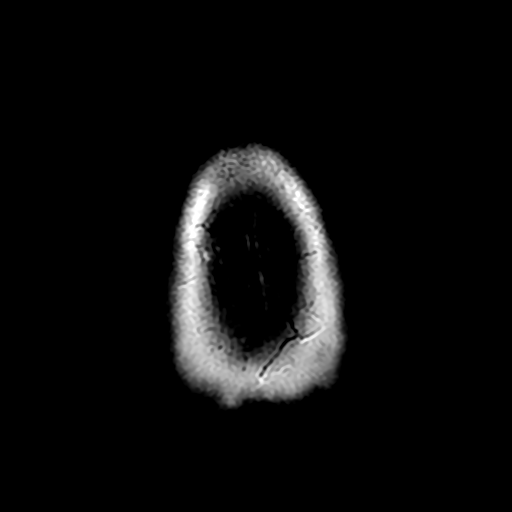

[Series 10: (person_name) · axial · 3.0mm · 0.47mm/px · z∈[-118,-73]mm · 3 of 96 slices shown]
[im 1/96]
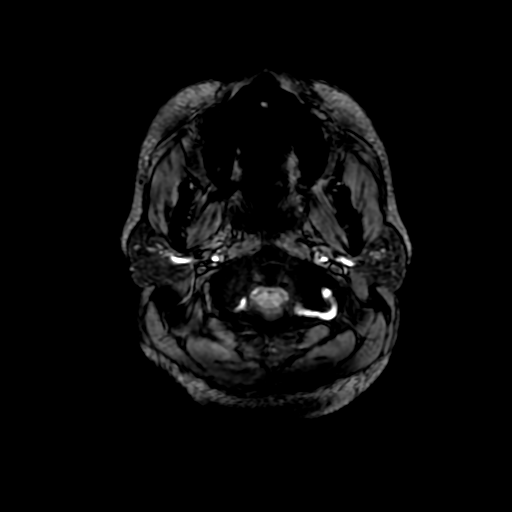
[im 11/96]
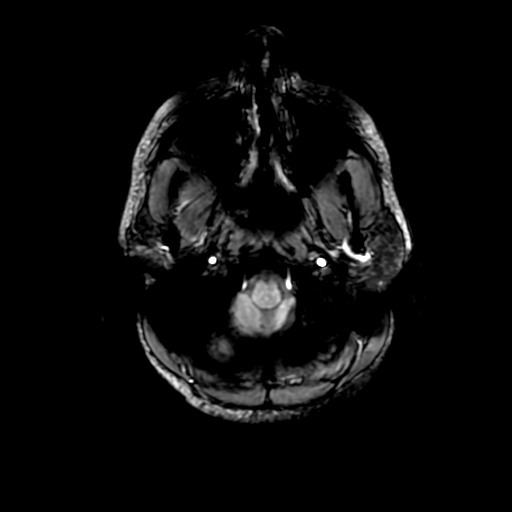
[im 32/96]
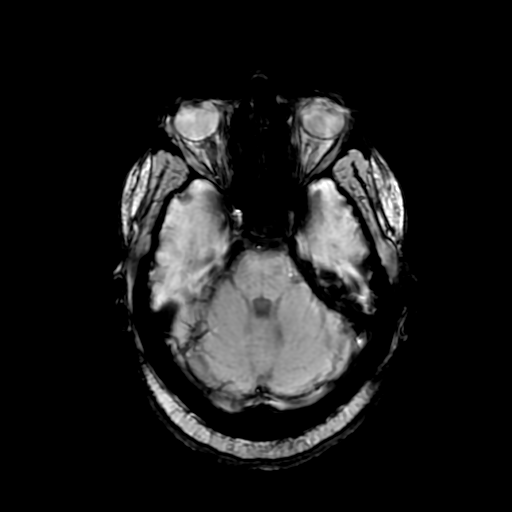

[Series 12: T2 · coronal · 5.0mm · 0.47mm/px · 3 of 26 slices shown (2 of 2)]
[im 1/26]
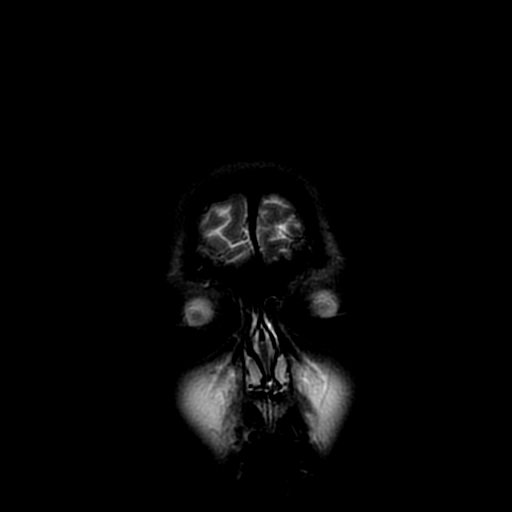
[im 13/26]
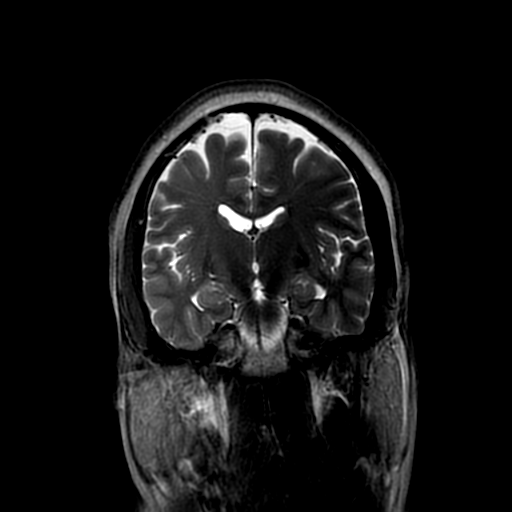
[im 26/26]
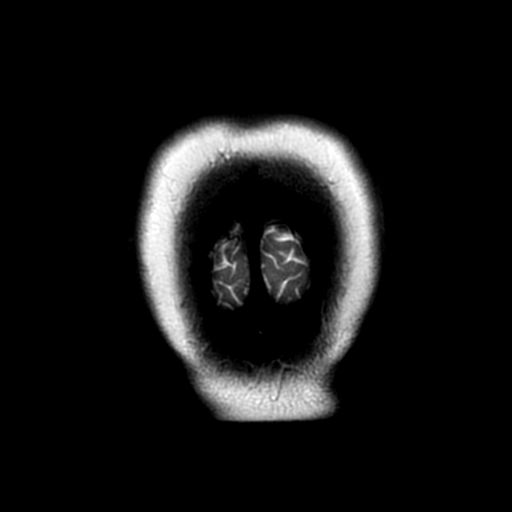

[Series 450: ADC · axial · 3.0mm · 0.94mm/px · z∈[-102,+27]mm · 5 of 45 slices shown (1 of 2)]
[im 1/45]
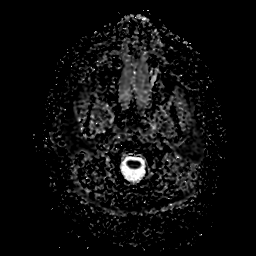
[im 12/45]
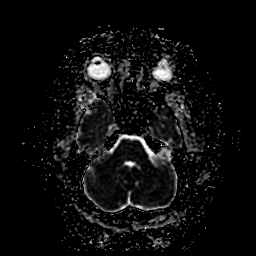
[im 23/45]
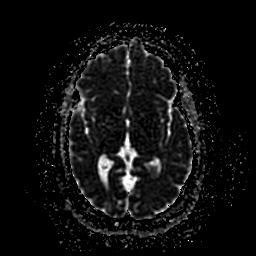
[im 34/45]
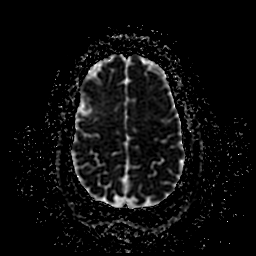
[im 45/45]
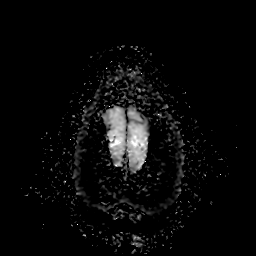

[Series 650: ADC · coronal · 4.0mm · 0.94mm/px · 3 of 33 slices shown (2 of 2)]
[im 1/33]
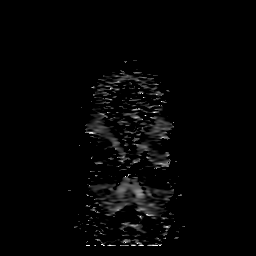
[im 17/33]
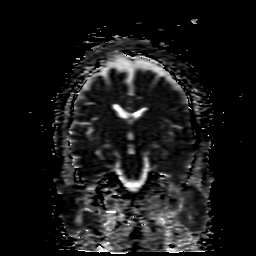
[im 33/33]
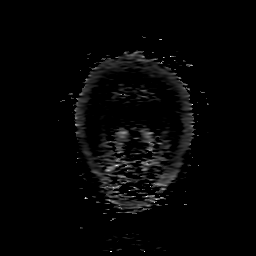

[36 of 48 positions shown; findings below may reference images not displayed]

FINDINGS: Ventricle size normal.  Cerebral volume normal.

Negative for acute infarct.

Small hyperintensity right frontal cortex compatible with chronic
ischemia is unchanged. No other areas of chronic ischemia. Cerebral
white matter otherwise normal. Brainstem and cerebellum normal.

Negative for hemorrhage or mass lesion. No shift of the midline
structures

Mild mucosal edema in the paranasal sinuses. Normal orbital
contents. Pituitary not enlarged.
IMPRESSION: No acute abnormality

Small chronic infarct right frontal lobe unchanged.

## 2017-11-23 ENCOUNTER — Observation Stay (HOSPITAL_COMMUNITY)
Admission: EM | Admit: 2017-11-23 | Discharge: 2017-11-25 | Disposition: A | Discharge status: Home / Self Care | Payer: Medicaid Other | Attending: Internal Medicine | Admitting: Internal Medicine

## 2017-11-23 ENCOUNTER — Emergency Department (HOSPITAL_COMMUNITY): Payer: Medicaid Other

## 2017-11-23 DIAGNOSIS — F419 Anxiety disorder, unspecified: Secondary | ICD-10-CM | POA: Diagnosis present

## 2017-11-23 DIAGNOSIS — Q211 Atrial septal defect: Secondary | ICD-10-CM | POA: Insufficient documentation

## 2017-11-23 DIAGNOSIS — I10 Essential (primary) hypertension: Secondary | ICD-10-CM | POA: Diagnosis not present

## 2017-11-23 DIAGNOSIS — G40909 Epilepsy, unspecified, not intractable, without status epilepticus: Secondary | ICD-10-CM | POA: Diagnosis not present

## 2017-11-23 DIAGNOSIS — I639 Cerebral infarction, unspecified: Secondary | ICD-10-CM | POA: Diagnosis present

## 2017-11-23 DIAGNOSIS — F418 Other specified anxiety disorders: Secondary | ICD-10-CM

## 2017-11-23 DIAGNOSIS — E1140 Type 2 diabetes mellitus with diabetic neuropathy, unspecified: Secondary | ICD-10-CM

## 2017-11-23 DIAGNOSIS — F319 Bipolar disorder, unspecified: Secondary | ICD-10-CM | POA: Diagnosis present

## 2017-11-23 DIAGNOSIS — G8191 Hemiplegia, unspecified affecting right dominant side: Secondary | ICD-10-CM

## 2017-11-23 DIAGNOSIS — F449 Dissociative and conversion disorder, unspecified: Principal | ICD-10-CM | POA: Insufficient documentation

## 2017-11-23 DIAGNOSIS — Z794 Long term (current) use of insulin: Secondary | ICD-10-CM | POA: Insufficient documentation

## 2017-11-23 DIAGNOSIS — I63233 Cerebral infarction due to unspecified occlusion or stenosis of bilateral carotid arteries: Secondary | ICD-10-CM | POA: Insufficient documentation

## 2017-11-23 DIAGNOSIS — I63133 Cerebral infarction due to embolism of bilateral carotid arteries: Secondary | ICD-10-CM | POA: Diagnosis not present

## 2017-11-23 DIAGNOSIS — J45909 Unspecified asthma, uncomplicated: Secondary | ICD-10-CM | POA: Insufficient documentation

## 2017-11-23 DIAGNOSIS — F1210 Cannabis abuse, uncomplicated: Secondary | ICD-10-CM | POA: Diagnosis present

## 2017-11-23 DIAGNOSIS — I69952 Hemiplegia and hemiparesis following unspecified cerebrovascular disease affecting left dominant side: Secondary | ICD-10-CM | POA: Insufficient documentation

## 2017-11-23 DIAGNOSIS — I69351 Hemiplegia and hemiparesis following cerebral infarction affecting right dominant side: Secondary | ICD-10-CM | POA: Diagnosis not present

## 2017-11-23 DIAGNOSIS — Z8673 Personal history of transient ischemic attack (TIA), and cerebral infarction without residual deficits: Secondary | ICD-10-CM

## 2017-11-23 DIAGNOSIS — I69354 Hemiplegia and hemiparesis following cerebral infarction affecting left non-dominant side: Secondary | ICD-10-CM | POA: Diagnosis not present

## 2017-11-23 DIAGNOSIS — Q2112 Patent foramen ovale: Secondary | ICD-10-CM

## 2017-11-23 DIAGNOSIS — F209 Schizophrenia, unspecified: Secondary | ICD-10-CM | POA: Diagnosis present

## 2017-11-23 DIAGNOSIS — E1149 Type 2 diabetes mellitus with other diabetic neurological complication: Secondary | ICD-10-CM | POA: Diagnosis present

## 2017-11-23 LAB — COMPREHENSIVE METABOLIC PANEL
ALK PHOS: 79 U/L (ref 38–126)
ALT: 17 U/L (ref 0–44)
ANION GAP: 11 (ref 5–15)
AST: 23 U/L (ref 15–41)
Albumin: 3.8 g/dL (ref 3.5–5.0)
BILIRUBIN TOTAL: 0.5 mg/dL (ref 0.3–1.2)
BUN: 9 mg/dL (ref 6–20)
CALCIUM: 9.4 mg/dL (ref 8.9–10.3)
CO2: 22 mmol/L (ref 22–32)
Chloride: 104 mmol/L (ref 98–111)
Creatinine, Ser: 1.44 mg/dL — ABNORMAL HIGH (ref 0.44–1.00)
GFR calc non Af Amer: 46 mL/min — ABNORMAL LOW (ref >60)
GFR, EST AFRICAN AMERICAN: 53 mL/min — AB (ref >60)
GLUCOSE: 92 mg/dL (ref 70–99)
Potassium: 3.9 mmol/L (ref 3.5–5.1)
SODIUM: 137 mmol/L (ref 135–145)
TOTAL PROTEIN: 7.2 g/dL (ref 6.5–8.1)

## 2017-11-23 LAB — I-STAT BETA HCG BLOOD, ED (MC, WL, AP ONLY): I-stat hCG, quantitative: <5 m[IU]/mL (ref <5)

## 2017-11-23 LAB — DIFFERENTIAL
Basophils Absolute: 0.1 10*3/uL (ref 0.0–0.1)
Basophils Relative: 1 %
EOS PCT: 1 %
Eosinophils Absolute: 0.1 10*3/uL (ref 0.0–0.7)
LYMPHS ABS: 1.6 10*3/uL (ref 0.7–4.0)
Lymphocytes Relative: 32 %
MONOS PCT: 9 %
Monocytes Absolute: 0.4 10*3/uL (ref 0.1–1.0)
NEUTROS ABS: 2.7 10*3/uL (ref 1.7–7.7)
NEUTROS PCT: 57 %

## 2017-11-23 LAB — CBC
HEMATOCRIT: 35.9 % — AB (ref 36.0–46.0)
HEMATOCRIT: 38.7 % (ref 36.0–46.0)
HEMOGLOBIN: 10.9 g/dL — AB (ref 12.0–15.0)
HEMOGLOBIN: 11.8 g/dL — AB (ref 12.0–15.0)
MCH: 25.4 pg — AB (ref 26.0–34.0)
MCH: 25.3 pg — AB (ref 26.0–34.0)
MCHC: 30.4 g/dL (ref 30.0–36.0)
MCHC: 30.5 g/dL (ref 30.0–36.0)
MCV: 83.7 fL (ref 78.0–100.0)
MCV: 83 fL (ref 78.0–100.0)
Platelets: 203 10*3/uL (ref 150–400)
Platelets: 208 10*3/uL (ref 150–400)
RBC: 4.29 MIL/uL (ref 3.87–5.11)
RBC: 4.66 MIL/uL (ref 3.87–5.11)
RDW: 15.9 % — ABNORMAL HIGH (ref 11.5–15.5)
RDW: 15.8 % — AB (ref 11.5–15.5)
WBC: 4.9 10*3/uL (ref 4.0–10.5)
WBC: 7.5 10*3/uL (ref 4.0–10.5)

## 2017-11-23 LAB — I-STAT CHEM 8, ED
BUN: 9 mg/dL (ref 6–20)
CHLORIDE: 101 mmol/L (ref 98–111)
Calcium, Ion: 1.16 mmol/L (ref 1.15–1.40)
Creatinine, Ser: 1.5 mg/dL — ABNORMAL HIGH (ref 0.44–1.00)
Glucose, Bld: 86 mg/dL (ref 70–99)
HEMATOCRIT: 34 % — AB (ref 36.0–46.0)
Hemoglobin: 11.6 g/dL — ABNORMAL LOW (ref 12.0–15.0)
Potassium: 3.9 mmol/L (ref 3.5–5.1)
SODIUM: 138 mmol/L (ref 135–145)
TCO2: 27 mmol/L (ref 22–32)

## 2017-11-23 LAB — CREATININE, SERUM
Creatinine, Ser: 1.49 mg/dL — ABNORMAL HIGH (ref 0.44–1.00)
GFR calc Af Amer: 51 mL/min — ABNORMAL LOW (ref >60)
GFR calc non Af Amer: 44 mL/min — ABNORMAL LOW (ref >60)

## 2017-11-23 LAB — CBG MONITORING, ED: Glucose-Capillary: 93 mg/dL (ref 70–99)

## 2017-11-23 LAB — GLUCOSE, CAPILLARY: GLUCOSE-CAPILLARY: 136 mg/dL — AB (ref 70–99)

## 2017-11-23 LAB — PROTIME-INR

## 2017-11-23 LAB — I-STAT TROPONIN, ED: Troponin i, poc: 0 ng/mL (ref 0.00–0.08)

## 2017-11-23 LAB — HEMOGLOBIN A1C
Hgb A1c MFr Bld: 5.6 % (ref 4.8–5.6)
Mean Plasma Glucose: 114.02 mg/dL

## 2017-11-23 LAB — APTT

## 2017-11-23 MED ORDER — INSULIN DETEMIR 100 UNIT/ML ~~LOC~~ SOLN
10.0000 [IU] | Freq: Every day | SUBCUTANEOUS | Status: DC
  Administered 2017-11-23: 10 [IU] via SUBCUTANEOUS
  Filled 2017-11-23 (×2): qty 0.1

## 2017-11-23 MED ORDER — FAMOTIDINE 20 MG PO TABS
20.0000 mg | ORAL_TABLET | Freq: Two times a day (BID) | ORAL | Status: DC
  Administered 2017-11-23 – 2017-11-25 (×4): 20 mg via ORAL
  Filled 2017-11-23 (×4): qty 1

## 2017-11-23 MED ORDER — ACETAMINOPHEN 650 MG RE SUPP: 650.0000 mg | Freq: Four times a day (QID) | RECTAL | Status: DC | PRN

## 2017-11-23 MED ORDER — GABAPENTIN 300 MG PO CAPS
600.0000 mg | ORAL_CAPSULE | Freq: Three times a day (TID) | ORAL | Status: DC
  Administered 2017-11-23 – 2017-11-25 (×5): 600 mg via ORAL
  Filled 2017-11-23 (×5): qty 2

## 2017-11-23 MED ORDER — RISPERIDONE 0.5 MG PO TABS
0.2500 mg | ORAL_TABLET | Freq: Two times a day (BID) | ORAL | Status: DC
  Administered 2017-11-23 – 2017-11-25 (×4): 0.25 mg via ORAL
  Filled 2017-11-23 (×4): qty 1

## 2017-11-23 MED ORDER — ACETAMINOPHEN 500 MG PO TABS: 500.0000 mg | ORAL_TABLET | Freq: Four times a day (QID) | ORAL | Status: DC | PRN

## 2017-11-23 MED ORDER — ONDANSETRON HCL 4 MG PO TABS: 4.0000 mg | ORAL_TABLET | Freq: Four times a day (QID) | ORAL | Status: DC | PRN

## 2017-11-23 MED ORDER — LABETALOL HCL 5 MG/ML IV SOLN
5.0000 mg | Freq: Once | INTRAVENOUS | Status: AC
  Administered 2017-11-23: 5 mg via INTRAVENOUS
  Filled 2017-11-23: qty 4

## 2017-11-23 MED ORDER — TRAZODONE HCL 100 MG PO TABS
150.0000 mg | ORAL_TABLET | Freq: Every day | ORAL | Status: DC
  Administered 2017-11-23 – 2017-11-24 (×2): 150 mg via ORAL
  Filled 2017-11-23 (×2): qty 1

## 2017-11-23 MED ORDER — DIPHENHYDRAMINE HCL 50 MG/ML IJ SOLN
25.0000 mg | Freq: Once | INTRAMUSCULAR | Status: AC
  Administered 2017-11-23: 25 mg via INTRAVENOUS
  Filled 2017-11-23: qty 1

## 2017-11-23 MED ORDER — IOPAMIDOL (ISOVUE-370) INJECTION 76%
50.0000 mL | Freq: Once | INTRAVENOUS | Status: AC | PRN
  Administered 2017-11-23: 50 mL via INTRAVENOUS

## 2017-11-23 MED ORDER — QUETIAPINE FUMARATE 50 MG PO TABS
300.0000 mg | ORAL_TABLET | Freq: Every day | ORAL | Status: DC
  Filled 2017-11-23: qty 6

## 2017-11-23 MED ORDER — METOCLOPRAMIDE HCL 5 MG/ML IJ SOLN
10.0000 mg | Freq: Once | INTRAMUSCULAR | Status: AC
  Administered 2017-11-23: 10 mg via INTRAVENOUS
  Filled 2017-11-23: qty 2

## 2017-11-23 MED ORDER — CLOPIDOGREL BISULFATE 75 MG PO TABS
75.0000 mg | ORAL_TABLET | Freq: Every day | ORAL | Status: DC
  Administered 2017-11-23 – 2017-11-25 (×3): 75 mg via ORAL
  Filled 2017-11-23 (×3): qty 1

## 2017-11-23 MED ORDER — AMLODIPINE BESYLATE 5 MG PO TABS
5.0000 mg | ORAL_TABLET | Freq: Every day | ORAL | Status: DC
  Administered 2017-11-23 – 2017-11-24 (×2): 5 mg via ORAL
  Filled 2017-11-23 (×2): qty 1

## 2017-11-23 MED ORDER — ENOXAPARIN SODIUM 30 MG/0.3ML ~~LOC~~ SOLN
30.0000 mg | SUBCUTANEOUS | Status: DC
  Administered 2017-11-23: 30 mg via SUBCUTANEOUS
  Filled 2017-11-23: qty 0.3

## 2017-11-23 MED ORDER — ASPIRIN EC 325 MG PO TBEC
325.0000 mg | DELAYED_RELEASE_TABLET | Freq: Every day | ORAL | Status: DC
  Administered 2017-11-23 – 2017-11-25 (×3): 325 mg via ORAL
  Filled 2017-11-23 (×3): qty 1

## 2017-11-23 MED ORDER — DEXAMETHASONE SODIUM PHOSPHATE 10 MG/ML IJ SOLN
10.0000 mg | Freq: Once | INTRAMUSCULAR | Status: AC
  Administered 2017-11-23: 10 mg via INTRAVENOUS
  Filled 2017-11-23: qty 1

## 2017-11-23 MED ORDER — ALBUTEROL SULFATE (2.5 MG/3ML) 0.083% IN NEBU: 3.0000 mL | INHALATION_SOLUTION | Freq: Four times a day (QID) | RESPIRATORY_TRACT | Status: DC | PRN

## 2017-11-23 MED ORDER — ACETAMINOPHEN 325 MG PO TABS: 650.0000 mg | ORAL_TABLET | Freq: Four times a day (QID) | ORAL | Status: DC | PRN

## 2017-11-23 MED ORDER — ALPRAZOLAM 0.5 MG PO TABS: 1.0000 mg | ORAL_TABLET | Freq: Two times a day (BID) | ORAL | Status: DC | PRN

## 2017-11-23 MED ORDER — LACTATED RINGERS IV BOLUS
1000.0000 mL | Freq: Once | INTRAVENOUS | Status: AC
  Administered 2017-11-23: 1000 mL via INTRAVENOUS

## 2017-11-23 MED ORDER — ATORVASTATIN CALCIUM 40 MG PO TABS
40.0000 mg | ORAL_TABLET | Freq: Every day | ORAL | Status: DC
  Administered 2017-11-24: 40 mg via ORAL
  Filled 2017-11-23: qty 1

## 2017-11-23 MED ORDER — ONDANSETRON HCL 4 MG/2ML IJ SOLN: 4.0000 mg | Freq: Four times a day (QID) | INTRAMUSCULAR | Status: DC | PRN

## 2017-11-23 MED ORDER — ASPIRIN EC 325 MG PO TBEC: 325.0000 mg | DELAYED_RELEASE_TABLET | Freq: Every day | ORAL | Status: DC

## 2017-11-23 MED ORDER — NITROGLYCERIN IN D5W 200-5 MCG/ML-% IV SOLN: 0.0000 ug/min | Freq: Once | INTRAVENOUS | Status: DC

## 2017-11-23 MED ORDER — INSULIN ASPART 100 UNIT/ML ~~LOC~~ SOLN
0.0000 [IU] | Freq: Three times a day (TID) | SUBCUTANEOUS | Status: DC
  Administered 2017-11-24 (×2): 1 [IU] via SUBCUTANEOUS

## 2017-11-23 NOTE — Code Documentation (Signed)
36 yo female getting off the bus today outside of the ED reported sudden onset of left sided weakness, slurred speech, and facial droop. Pt has hx of stroke and conversion disorder. Pt triaged and triage activated Code Stroke. Stroke team met patient in CT. CT Head negative. CTA/CTP negative for LVO. While completing initial NIHSS, patient was noted to have good tone in upper and lower extremities, but could not resist gravity when asked to hold extremity in the air. Assessment of facial droop was noted to have patient hold her jaw to one side. When assessing extinction, patient reported that this RN was consistently touching her right leg even when she was only touching left. Initial NIHSS 13 due answering question incorrectly, slight facial droop noted with jaw slightly to the left, left arm not resisting gravity, left leg with no effort against gravity, subjective sensory decreased on the left side, aphasia when naming objectives, some dysarthria, and extinction when asked distinctly. Concern for conversion disorder. Pt taken to MRI. No stroke noted on MRI or CTA/CTP. Handoff given to Taylor, RN 

## 2017-11-23 NOTE — Progress Notes (Signed)
Stroke Neurology Consultation Note  Consult Requested by: Dr. Clayborne Dana  Reason for Consult: code stroke  Consult Date: 11/23/17  The history was obtained from the pt.  During history and examination, all items were able to be obtained unless otherwise noted.  History of Present Illness:  Tanya Mayer is a 37 y.o. African American female with PMH of multiple strokes, large PFO, tobacco use, HTN and conversion disorders presented withacute onset left facial droop, slurry speech, left sided weakness while she was on the bus at 11:30am. She stated that she was on the bus and suddenly she had bilateral temporal HA and chest pain and then almost at the same time, she had left facial droop, slurry speech and left paralysis. She was sent to ED for evaluation.    She has extensive hx of strokes (see below) and conversion disorders. Her last admission for stroke was 09/2015 with punctate right temporal lobe infarct. However, she also had multiple presentation of conversion disorder mimicking acute stroke. Last presentation of that was 10/2015. As per record, she does have large PFO but she was not compliant with medication and with appointment. She was deemed not good candidate for PFO closure.   She continues to smoke. She admitted that she uses THC and occasionally cocaine and heroin. She had multiple social issues in the past which likely to be the stressor for her conversion disorder. She stated that she is homeless now.  Recurrent embolic strokes  August 2016 - 2 punctate left frontal white matter DWI changes  October 2016 - right MCA infarct with left frontal punctate DWI changes - found to have PFO on TEE - however, no show   November 2016 - asymptomatic left frontal punctate DWI changes  09/2015 - Punctate right temporal lobe infarct, MRA unremarkable, no DVT, LDL 97 and A1C 5.7  LSN: 11:30 am today tPA Given: No, likely conversion disorder   Past Medical History:  Diagnosis Date  . Anemia    . Anginal pain (HCC)   . Bipolar disorder (HCC)   . Bronchial asthma   . Chronic lower back pain   . DM (diabetes mellitus) type II uncontrolled, periph vascular disorder (HCC)    notes 01/03/2015  . DVT (deep venous thrombosis) (HCC) after 2008   "BLE"  . GERD (gastroesophageal reflux disease)   . Heart murmur    "born w/one"  . Hyperlipemia   . Hypertension   . Migraine    "weekly" (03/19/2015)  . PFO (patent foramen ovale)    large, per TEE 03/01/2015  . Schizophrenia (HCC)   . 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 Stroke (HCC) 04/2014; 09/2014(/notes 04/25/2015);  12/2014; 01/2015; 02/2015   "left side weakness; speech problems since" (03/19/2015)  . Stroke [facility]) 03/19/2015   "left side weakness; speech problems" (03/19/2015)    Past Surgical History:  Procedure Laterality Date  . CESAREAN SECTION  2008  . HERNIA REPAIR    . TEE WITHOUT CARDIOVERSION N/A 03/01/2015   Procedure: TRANSESOPHAGEAL ECHOCARDIOGRAM (TEE);  Surgeon: Thurmon Fair, MD;  Location: [facility] ENDOSCOPY;  Service: Cardiovascular;  Laterality: N/A;  . TUBAL LIGATION  2008  . UMBILICAL HERNIA REPAIR  "@ birth"  . VSD REPAIR      as a baby    Family History  Problem Relation Age of Onset  . Hypertension Mother   . Clotting disorder Mother        hypercoagulable disorder on mother side  . Heart attack Mother  Social History:  reports that she has been smoking cigarettes.  She has a 6.93 pack-year smoking history. She has never used smokeless tobacco. She reports that she drinks alcohol. She reports that she has current or past drug history. Drug: Marijuana.  Allergies:  Allergies  Allergen Reactions  . Carrot [Daucus Carota] Anaphylaxis  . Eggs Or Egg-Derived Products Anaphylaxis and Swelling  . Mushroom Extract Complex Anaphylaxis    No current facility-administered medications on file prior to encounter.    Current Outpatient Medications on File Prior to Encounter  Medication Sig  Dispense Refill  . acetaminophen (TYLENOL) 500 MG tablet Take 500 mg by mouth every 6 (six) hours as needed for moderate pain.    Marland Kitchen albuterol (PROVENTIL HFA;VENTOLIN HFA) 108 (90 Base) MCG/ACT inhaler Inhale 2 puffs into the lungs every 6 (six) hours as needed for wheezing or shortness of breath. 1 Inhaler 2  . ALPRAZolam (XANAX) 1 MG tablet Take 1 tablet (1 mg total) by mouth 2 (two) times daily as needed for anxiety. 20 tablet 0  . aspirin EC 325 MG EC tablet Take 1 tablet (325 mg total) by mouth daily. 90 tablet 3  . atorvastatin (LIPITOR) 40 MG tablet Take 1 tablet (40 mg total) by mouth daily at 6 PM. 90 tablet 3  . clopidogrel (PLAVIX) 75 MG tablet Take 1 tablet (75 mg total) by mouth daily. 7 tablet 0  . diphenhydramine-acetaminophen (TYLENOL PM) 25-500 MG TABS tablet Take 1 tablet by mouth at bedtime as needed (sleep).    . famotidine (PEPCID) 20 MG tablet Take 1 tablet (20 mg total) by mouth 2 (two) times daily. 14 tablet 0  . gabapentin (NEURONTIN) 300 MG capsule Take 3 capsules (900 mg total) by mouth 3 (three) times daily. 270 capsule 1  . hydrOXYzine (VISTARIL) 50 MG capsule Take 1 capsule (50 mg total) by mouth at bedtime. 30 capsule 0  . insulin detemir (LEVEMIR) 100 UNIT/ML injection Inject 0.1 mLs (10 Units total) into the skin at bedtime. (Patient taking differently: Inject 20 Units into the skin 3 (three) times daily. ) 10 mL 5  . QUEtiapine (SEROQUEL) 300 MG tablet Take 1 tablet (300 mg total) by mouth at bedtime. 10 tablet 0  . risperiDONE (RISPERDAL) 0.25 MG tablet Take 1 tablet (0.25 mg total) by mouth 2 (two) times daily. 20 tablet 0  . trazodone (DESYREL) 300 MG tablet Take 1 tablet (300 mg total) by mouth at bedtime. 10 tablet 0    Review of Systems: A full ROS was attempted today and was able to be performed.  Systems assessed include - Constitutional, Eyes,  HENT, Respiratory, Cardiovascular, Gastrointestinal, Genitourinary, Integument/breast, Hematologic/lymphatic,  Musculoskeletal, Neurological, Behavioral/Psych, Endocrine, Allergic/Immunologic - with pertinent responses as per HPI.  Physical Examination: Temp:  [98.5 F (36.9 C)] 98.5 F (36.9 C) (07/16 1206) Pulse Rate:  [65-83] 65 (07/16 1335) Resp:  [15-18] 15 (07/16 1335) BP: (172-178)/(124-131) 178/124 (07/16 1335) SpO2:  [100 %] 100 % (07/16 1335) Weight:  [109 lb (49.4 kg)-109 lb 6.4 oz (49.6 kg)] 109 lb (49.4 kg) (07/16 1339)  General - well nourished, well developed, in no apparent distress.    Ophthalmologic - fundi not visualized due to noncooperation.    Cardiovascular - regular rate and rhythm  Mental Status -  Level of arousal and orientation to place, self, age and person were intact, but state year was 2018 and month was June. Language including expression, naming, repetition, comprehension, reading, and writing was assessed and found intact,  however significant slurry speech with stuttering   Cranial Nerves II - XII - II - Vision intact OU. III, IV, VI - Extraocular movements intact. V - subjectively loss of sensation on the left. VII - face crooked with misaligned upper and lower lips. VIII - Hearing & vestibular intact bilaterally. X - Palate elevates symmetrically. XI - not moving left shoulder XII - Tongue protrusion intact.  Motor Strength - The patient's strength was normal in RUE and RLE, not moving LUE and LLE as requested. However, when release left arm suddenly, there is muscle strength to hold arm slowly drifting to bed. LLE positive hoover's sign.    Motor Tone & Bulk - Muscle tone was assessed at the neck and appendages and was normal.  Bulk was normal and fasciculations were absent.   Reflexes - The patient's reflexes were normal in all extremities and she had no pathological reflexes.  Sensory - Light touch, temperature/pinprick were assessed and were subjective loss of sensation. However, positive "yes" and "no" test indicating functional component.     Coordination - The patient had normal movements in the right hand with no ataxia or dysmetria.  Tremor was absent.  Gait and Station - deferred  Data Reviewed: I have personally reviewed the radiological images below and agree with the radiology interpretations.  Ct Angio Head W Or Wo Contrast  Result Date: 11/23/2017 CLINICAL DATA:  Slurred speech and right-sided weakness. Prior stroke. EXAM: CT ANGIOGRAPHY HEAD AND NECK TECHNIQUE: Multidetector CT imaging of the head and neck was performed using the standard protocol during bolus administration of intravenous contrast. Multiplanar CT image reconstructions and MIPs were obtained to evaluate the vascular anatomy. Carotid stenosis measurements (when applicable) are obtained utilizing NASCET criteria, using the distal internal carotid diameter as the denominator. CONTRAST:  50mL ISOVUE-370 IOPAMIDOL (ISOVUE-370) INJECTION 76% COMPARISON:  Head MRA 09/25/2015. FINDINGS: CTA NECK FINDINGS Aortic arch: Normal variant aortic arch branching pattern with common origin of the brachiocephalic and left common carotid arteries. Widely patent arch vessel origins. Right carotid system: Patent without evidence of stenosis, dissection, or significant atherosclerosis. Left carotid system: Patent without evidence of stenosis, dissection, or significant atherosclerosis. Vertebral arteries: Patent without evidence of stenosis, dissection, or significant atherosclerosis. Mildly dominant left vertebral artery. Skeleton: Mild cervical spondylosis. Other neck: No evidence of acute abnormality or mass. Upper chest: Clear lung apices. Review of the MIP images confirms the above findings CTA HEAD FINDINGS Anterior circulation: The internal carotid arteries are widely patent from skull base to carotid termini. ACAs and MCAs are patent without evidence of proximal branch occlusion or significant proximal stenosis. The right A1 segment is hypoplastic. No aneurysm is identified.  Posterior circulation: The intracranial vertebral arteries are widely patent to the basilar. Patent bilateral PICA, right AICA, and left SCA origins are visualized. The basilar artery is patent with mild narrowing distally. There is a small left posterior communicating artery. The PCAs are patent without evidence of significant stenosis. No aneurysm is identified. Venous sinuses: Patent. Anatomic variants: Hypoplastic right A1 segment. Review of the MIP images confirms the above findings IMPRESSION: 1. No emergent large vessel occlusion. 2. Patent circle of Willis without proximal branch occlusion or flow limiting proximal stenosis. 3. Mild distal basilar artery narrowing. 4. Widely patent cervical carotid and vertebral arteries. These results were communicated to Dr. Roda Shutters at 12:59 pm on 11/23/2017 by text page via the [facility] messaging system. Electronically Signed   By: Sebastian Ache M.D.   On: 11/23/2017 13:10  Ct Angio Neck W Or Wo Contrast  Result Date: 11/23/2017 CLINICAL DATA:  Slurred speech and right-sided weakness. Prior stroke. EXAM: CT ANGIOGRAPHY HEAD AND NECK TECHNIQUE: Multidetector CT imaging of the head and neck was performed using the standard protocol during bolus administration of intravenous contrast. Multiplanar CT image reconstructions and MIPs were obtained to evaluate the vascular anatomy. Carotid stenosis measurements (when applicable) are obtained utilizing NASCET criteria, using the distal internal carotid diameter as the denominator. CONTRAST:  50mL ISOVUE-370 IOPAMIDOL (ISOVUE-370) INJECTION 76% COMPARISON:  Head MRA 09/25/2015. FINDINGS: CTA NECK FINDINGS Aortic arch: Normal variant aortic arch branching pattern with common origin of the brachiocephalic and left common carotid arteries. Widely patent arch vessel origins. Right carotid system: Patent without evidence of stenosis, dissection, or significant atherosclerosis. Left carotid system: Patent without evidence of stenosis,  dissection, or significant atherosclerosis. Vertebral arteries: Patent without evidence of stenosis, dissection, or significant atherosclerosis. Mildly dominant left vertebral artery. Skeleton: Mild cervical spondylosis. Other neck: No evidence of acute abnormality or mass. Upper chest: Clear lung apices. Review of the MIP images confirms the above findings CTA HEAD FINDINGS Anterior circulation: The internal carotid arteries are widely patent from skull base to carotid termini. ACAs and MCAs are patent without evidence of proximal branch occlusion or significant proximal stenosis. The right A1 segment is hypoplastic. No aneurysm is identified. Posterior circulation: The intracranial vertebral arteries are widely patent to the basilar. Patent bilateral PICA, right AICA, and left SCA origins are visualized. The basilar artery is patent with mild narrowing distally. There is a small left posterior communicating artery. The PCAs are patent without evidence of significant stenosis. No aneurysm is identified. Venous sinuses: Patent. Anatomic variants: Hypoplastic right A1 segment. Review of the MIP images confirms the above findings IMPRESSION: 1. No emergent large vessel occlusion. 2. Patent circle of Willis without proximal branch occlusion or flow limiting proximal stenosis. 3. Mild distal basilar artery narrowing. 4. Widely patent cervical carotid and vertebral arteries. These results were communicated to Dr. Roda Shutters at 12:59 pm on 11/23/2017 by text page via the [facility] messaging system. Electronically Signed   By: Sebastian Ache M.D.   On: 11/23/2017 13:10   Mr Brain Wo Contrast  Result Date: 11/23/2017 CLINICAL DATA:  Sudden onset of left-sided weakness, slurred speech and facial droop. EXAM: MRI HEAD WITHOUT CONTRAST TECHNIQUE: Multiplanar, multiecho pulse sequences of the brain and surrounding structures were obtained without intravenous contrast. COMPARISON:  CT study same day.  MRI 11/05/2015. FINDINGS: Brain: No  sign of acute infarction, mass lesion, hemorrhage, hydrocephalus or extra-axial collection. There is an old right frontal cortical infarction. Small lacunar infarct left caudate head is unchanged. No other abnormality. Vascular: Major vessels at the base of the brain show flow. Venous sinuses appear patent. Skull and upper cervical spine: Normal. Sinuses/Orbits: Clear/normal. Other: None significant. IMPRESSION: No acute finding. No change since 2017. Old small right frontal cortical infarction. Old small lacunar infarct left caudate head. Electronically Signed   By: Paulina Fusi M.D.   On: 11/23/2017 13:36   Ct Head Code Stroke Wo Contrast  Result Date: 11/23/2017 CLINICAL DATA:  Code stroke. 37 year old female with weakness and slurred speech. Chronic right MCA territory encephalomalacia. EXAM: CT HEAD WITHOUT CONTRAST TECHNIQUE: Contiguous axial images were obtained from the base of the skull through the vertex without intravenous contrast. COMPARISON:  Brain MRI 11/05/2015, and earlier. FINDINGS: Brain: Stable small area of chronic encephalomalacia in the right middle frontal gyrus anteriorly seen on series 3, image 21  today. Gray-white matter differentiation elsewhere remains normal. For stable underlying cerebral volume, with chronic volume loss in the posterior body of the corpus callosum. No midline shift, ventriculomegaly, mass effect, evidence of mass lesion, intracranial hemorrhage or evidence of cortically based acute infarction. Vascular: No suspicious intracranial vascular hyperdensity. Skull: Stable, negative. Sinuses/Orbits: Visualized paranasal sinuses and mastoids are stable and well pneumatized. Other: Visualized orbits and scalp soft tissues are within normal limits. ASPECTS [facility]([place] Stroke Program Early CT Score) - Ganglionic level infarction (caudate, lentiform nuclei, internal capsule, insula, M1-M3 cortex): 7 - Supraganglionic infarction (M4-M6 cortex): 3 Total score (0-10 with 10 being  normal): 10 IMPRESSION: 1. No acute intracranial hemorrhage or cortically based infarct identified. 2. Stable appearance of the brain since 2017 with a small area of chronic encephalomalacia in the right middle frontal gyrus, and chronic or congenital volume loss in the posterior corpus callosum. ASPECTS is 10. 3. These results were communicated to Dr. Jerel ShepherdJ. Evertt Chouinard at 12:34 pmon 7/16/2019by text page via the [facility] messaging system. Electronically Signed   By: Odessa FlemingH  Hall M.D.   On: 11/23/2017 12:35    Assessment: 37 y.o. female PMH of multiple strokes, large PFO, tobacco use, HTN and conversion disorders presented withHA, chest pain, acute onset left facial droop, slurry speech, left sided weakness. However, there is functional component on exam. BP was high. CT no acute abnormality but old right frontal encephalomalacia, CTA head and neck unremarkable. MRI no acute infarct. Pt condition more consistent with conversion disorder. Although complicated migraine and hypertensive urgency are in DDx but with functional component, it felt less likely. Will recommend hydration, HA management, OOB.   Stroke Risk Factors - PFO and history of stroke  Plan: - pt condition consistent with conversion disorder at this time - pt does complain of HA, recommend HA management and hydration - encourage activities in ED, relaxation  - BP management as BP was high in ED. Recommend gradually decrease BP to normotensive - if pt is able to ambulate afterward, she may be discharged from neuro standpoint - if pt remains weak on the left side not able to be safely discharged, may consider admit for observation.   Thank you for this consultation and allowing us to participate in the care of this patient.  Marvel PlanJindong Dawana Asper, MD PhD Stroke Neurology 11/23/2017 2:12 PM  This patient is critically ill due to code stroke status, hypertensive urgency and at significant risk of neurological worsening, death form stroke, hemorrhage, hypertensive  encephalopathy. This patient's care requires constant monitoring of vital signs, hemodynamics, respiratory and cardiac monitoring, review of multiple databases, neurological assessment, discussion with family, other specialists and medical decision making of high complexity. I spent 55 minutes of neurocritical care time in the care of this patient.

## 2017-11-23 NOTE — ED Notes (Signed)
Neurologist at bedside. 

## 2017-11-23 NOTE — H&P (Addendum)
History and Physical    Tanya Mayer JYN:[id number] DOB: 1980-07-10 DOA: 11/23/2017  Referring MD/NP/PA: EDP PCP:  Patient coming from: bus stop  Chief Complaint:   HPI: Tanya Mayer is a 37 y.o. female with medical history significant for extensive psychiatric history with schizophrenia, bipolar disorder, conversion disorders, multiple prior strokes, large PFO, tobacco abuse, polysubstance abuse and diabetes presented to the ED with acute onset headache chest pain and dizziness left-sided weakness and left facial weakness. -Patient reports that she was on the bus when she suddenly had worsening headache followed by left-sided weakness facial droop and slurring of speech. -got off the bus and was subsequently brought to the emergency room by EMS. -patient admits noncompliance with medications and using marijuana and cocaine yesterday  ED Course: seen by neurology, MRI brain negative for acute findings, evidence for prior infarcts noted  Review of Systems: As per HPI otherwise 14 point review of systems negative.   Past Medical History:  Diagnosis Date  . Anemia   . Anginal pain (HCC)   . Bipolar disorder (HCC)   . Bronchial asthma   . Chronic lower back pain   . DM (diabetes mellitus) type II uncontrolled, periph vascular disorder (HCC)    notes 01/03/2015  . DVT (deep venous thrombosis) (HCC) after 2008   "BLE"  . GERD (gastroesophageal reflux disease)   . Heart murmur    "born w/one"  . Hyperlipemia   . Hypertension   . Migraine    "weekly" (03/19/2015)  . PFO (patent foramen ovale)    large, per TEE 03/01/2015  . Schizophrenia (HCC)   . 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 Stroke (HCC) 04/2014; 09/2014(/notes 04/25/2015);  12/2014; 01/2015; 02/2015   "left side weakness; speech problems since" (03/19/2015)  . Stroke [facility]) 03/19/2015   "left side weakness; speech problems" (03/19/2015)    Past Surgical History:  Procedure Laterality Date  . CESAREAN SECTION   2008  . HERNIA REPAIR    . TEE WITHOUT CARDIOVERSION N/A 03/01/2015   Procedure: TRANSESOPHAGEAL ECHOCARDIOGRAM (TEE);  Surgeon: Thurmon Fair, MD;  Location: [facility] ENDOSCOPY;  Service: Cardiovascular;  Laterality: N/A;  . TUBAL LIGATION  2008  . UMBILICAL HERNIA REPAIR  "@ birth"  . VSD REPAIR      as a baby     reports that she has been smoking cigarettes.  She has a 6.93 pack-year smoking history. She has never used smokeless tobacco. She reports that she drinks alcohol. She reports that she has current or past drug history. Drug: Marijuana.  Allergies  Allergen Reactions  . Carrot [Daucus Carota] Anaphylaxis  . Eggs Or Egg-Derived Products Anaphylaxis and Swelling  . Mushroom Extract Complex Anaphylaxis    Family History  Problem Relation Age of Onset  . Hypertension Mother   . Clotting disorder Mother        hypercoagulable disorder on mother side  . Heart attack Mother      Prior to Admission medications   Medication Sig Start Date End Date Taking? Authorizing Provider  acetaminophen (TYLENOL) 500 MG tablet Take 500 mg by mouth every 6 (six) hours as needed for moderate pain.    [provider]  albuterol (PROVENTIL HFA;VENTOLIN HFA) 108 (90 Base) MCG/ACT inhaler Inhale 2 puffs into the lungs every 6 (six) hours as needed for wheezing or shortness of breath. 07/02/15   Rai, Ripudeep K, MD  ALPRAZolam Prudy Feeler) 1 MG tablet Take 1 tablet (1 mg total) by mouth 2 (two) times daily  as needed for anxiety. 07/02/15   Rai, Delene Ruffini, MD  aspirin EC 325 MG EC tablet Take 1 tablet (325 mg total) by mouth daily. 09/30/15   Marvel Plan, MD  atorvastatin (LIPITOR) 40 MG tablet Take 1 tablet (40 mg total) by mouth daily at 6 PM. 09/30/15   Marvel Plan, MD  clopidogrel (PLAVIX) 75 MG tablet Take 1 tablet (75 mg total) by mouth daily. 11/05/15   Maxine Glenn, MD  diphenhydramine-acetaminophen (TYLENOL PM) 25-500 MG TABS tablet Take 1 tablet by mouth at bedtime as needed (sleep).     [provider]  famotidine (PEPCID) 20 MG tablet Take 1 tablet (20 mg total) by mouth 2 (two) times daily. 07/02/15   Rai, Delene Ruffini, MD  gabapentin (NEURONTIN) 300 MG capsule Take 3 capsules (900 mg total) by mouth 3 (three) times daily. 07/02/15   Rai, Delene Ruffini, MD  hydrOXYzine (VISTARIL) 50 MG capsule Take 1 capsule (50 mg total) by mouth at bedtime. 07/02/15   Rai, Ripudeep K, MD  insulin detemir (LEVEMIR) 100 UNIT/ML injection Inject 0.1 mLs (10 Units total) into the skin at bedtime. Patient taking differently: Inject 20 Units into the skin 3 (three) times daily.  07/02/15   Rai, Delene Ruffini, MD  QUEtiapine (SEROQUEL) 300 MG tablet Take 1 tablet (300 mg total) by mouth at bedtime. 07/02/15   Rai, Delene Ruffini, MD  risperiDONE (RISPERDAL) 0.25 MG tablet Take 1 tablet (0.25 mg total) by mouth 2 (two) times daily. 07/02/15   Rai, Delene Ruffini, MD  trazodone (DESYREL) 300 MG tablet Take 1 tablet (300 mg total) by mouth at bedtime. 07/02/15   Cathren Harsh, MD    Physical Exam: Vitals:   11/23/17 1400 11/23/17 1430 11/23/17 1500 11/23/17 1530  BP: (!) 201/134 (!) 190/128 (!) 208/121 (!) 185/119  Pulse:  62    Resp: 11 19 19 16   Temp:      TempSrc:      SpO2:  99%    Weight:      Height:          Constitutional: NAD, calm, chronically ill-appearing debilitated female, drowsy after Benadryl Vitals:   11/23/17 1400 11/23/17 1430 11/23/17 1500 11/23/17 1530  BP: (!) 201/134 (!) 190/128 (!) 208/121 (!) 185/119  Pulse:  62    Resp: 11 19 19 16   Temp:      TempSrc:      SpO2:  99%    Weight:      Height:       Eyes: PERRL, lids and conjunctivae normal ENMT: Mucous membranes are moist.  Neck: normal, supple Respiratory: clear to auscultation bilaterally Cardiovascular: Regular rate and rhythm Abdomen: soft, non tender, Bowel sounds positive.  Musculoskeletal: No joint deformity upper and lower extremities. Ext: no edema Skin: no rashes, lesions, ulcers.  Neurologic: mild  left facial droop noted, left upper extremity weakness, left lower extremity weakness both 3/5, poor effort, sensations intact DTR 2+ plantars withdrawal Psychiatric: flat affect   Labs on Admission: I have personally reviewed following labs and imaging studies  CBC: Recent Labs  Lab 11/23/17 1206 11/23/17 1305  WBC 4.9  --   NEUTROABS 2.7  --   HGB 10.9* 11.6*  HCT 35.9* 34.0*  MCV 83.7  --   PLT 203  --    Basic Metabolic Panel: Recent Labs  Lab 11/23/17 1206 11/23/17 1305  NA 137 138  K 3.9 3.9  CL 104 101  CO2 22  --  GLUCOSE 92 86  BUN 9 9  CREATININE 1.44* 1.50*  CALCIUM 9.4  --    GFR: Estimated Creatinine Clearance: 40 mL/min (A) (by C-G formula based on SCr of 1.5 mg/dL (H)). Liver Function Tests: Recent Labs  Lab 11/23/17 1206  AST 23  ALT 17  ALKPHOS 79  BILITOT 0.5  PROT 7.2  ALBUMIN 3.8   No results for input(s): LIPASE, AMYLASE in the last 168 hours. No results for input(s): AMMONIA in the last 168 hours. Coagulation Profile: Recent Labs  Lab 11/23/17 1206  INR SPECIMEN CLOTTED   Cardiac Enzymes: No results for input(s): CKTOTAL, CKMB, CKMBINDEX, TROPONINI in the last 168 hours. BNP (last 3 results) No results for input(s): PROBNP in the last 8760 hours. HbA1C: No results for input(s): HGBA1C in the last 72 hours. CBG: Recent Labs  Lab 11/23/17 1207  GLUCAP 93   Lipid Profile: No results for input(s): CHOL, HDL, LDLCALC, TRIG, CHOLHDL, LDLDIRECT in the last 72 hours. Thyroid Function Tests: No results for input(s): TSH, T4TOTAL, FREET4, T3FREE, THYROIDAB in the last 72 hours. Anemia Panel: No results for input(s): VITAMINB12, FOLATE, FERRITIN, TIBC, IRON, RETICCTPCT in the last 72 hours. Urine analysis:    Component Value Date/Time   COLORURINE YELLOW 09/25/2015 0645   APPEARANCEUR CLOUDY (A) 09/25/2015 0645   LABSPEC 1.020 09/25/2015 0645   PHURINE 6.0 09/25/2015 0645   GLUCOSEU NEGATIVE 09/25/2015 0645   HGBUR NEGATIVE  09/25/2015 0645   BILIRUBINUR NEGATIVE 09/25/2015 0645   KETONESUR NEGATIVE 09/25/2015 0645   PROTEINUR 100 (A) 09/25/2015 0645   UROBILINOGEN 0.2 02/27/2015 1146   NITRITE POSITIVE (A) 09/25/2015 0645   LEUKOCYTESUR SMALL (A) 09/25/2015 0645   Sepsis Labs: @LABRCNTIP (procalcitonin:4,lacticidven:4) )No results found for this or any previous visit (from the past 240 hour(s)).   Radiological Exams on Admission: Ct Angio Head W Or Wo Contrast  Result Date: 11/23/2017 CLINICAL DATA:  Slurred speech and right-sided weakness. Prior stroke. EXAM: CT ANGIOGRAPHY HEAD AND NECK TECHNIQUE: Multidetector CT imaging of the head and neck was performed using the standard protocol during bolus administration of intravenous contrast. Multiplanar CT image reconstructions and MIPs were obtained to evaluate the vascular anatomy. Carotid stenosis measurements (when applicable) are obtained utilizing NASCET criteria, using the distal internal carotid diameter as the denominator. CONTRAST:  50mL ISOVUE-370 IOPAMIDOL (ISOVUE-370) INJECTION 76% COMPARISON:  Head MRA 09/25/2015. FINDINGS: CTA NECK FINDINGS Aortic arch: Normal variant aortic arch branching pattern with common origin of the brachiocephalic and left common carotid arteries. Widely patent arch vessel origins. Right carotid system: Patent without evidence of stenosis, dissection, or significant atherosclerosis. Left carotid system: Patent without evidence of stenosis, dissection, or significant atherosclerosis. Vertebral arteries: Patent without evidence of stenosis, dissection, or significant atherosclerosis. Mildly dominant left vertebral artery. Skeleton: Mild cervical spondylosis. Other neck: No evidence of acute abnormality or mass. Upper chest: Clear lung apices. Review of the MIP images confirms the above findings CTA HEAD FINDINGS Anterior circulation: The internal carotid arteries are widely patent from skull base to carotid termini. ACAs and MCAs are  patent without evidence of proximal branch occlusion or significant proximal stenosis. The right A1 segment is hypoplastic. No aneurysm is identified. Posterior circulation: The intracranial vertebral arteries are widely patent to the basilar. Patent bilateral PICA, right AICA, and left SCA origins are visualized. The basilar artery is patent with mild narrowing distally. There is a small left posterior communicating artery. The PCAs are patent without evidence of significant stenosis. No aneurysm is identified. Venous sinuses:  Patent. Anatomic variants: Hypoplastic right A1 segment. Review of the MIP images confirms the above findings IMPRESSION: 1. No emergent large vessel occlusion. 2. Patent circle of Willis without proximal branch occlusion or flow limiting proximal stenosis. 3. Mild distal basilar artery narrowing. 4. Widely patent cervical carotid and vertebral arteries. These results were communicated to Dr. Roda ShuttersXu at 12:59 pm on 11/23/2017 by text page via the [facility] messaging system. Electronically Signed   By: Sebastian AcheAllen  Grady M.D.   On: 11/23/2017 13:10   Ct Angio Neck W Or Wo Contrast  Result Date: 11/23/2017 CLINICAL DATA:  Slurred speech and right-sided weakness. Prior stroke. EXAM: CT ANGIOGRAPHY HEAD AND NECK TECHNIQUE: Multidetector CT imaging of the head and neck was performed using the standard protocol during bolus administration of intravenous contrast. Multiplanar CT image reconstructions and MIPs were obtained to evaluate the vascular anatomy. Carotid stenosis measurements (when applicable) are obtained utilizing NASCET criteria, using the distal internal carotid diameter as the denominator. CONTRAST:  50mL ISOVUE-370 IOPAMIDOL (ISOVUE-370) INJECTION 76% COMPARISON:  Head MRA 09/25/2015. FINDINGS: CTA NECK FINDINGS Aortic arch: Normal variant aortic arch branching pattern with common origin of the brachiocephalic and left common carotid arteries. Widely patent arch vessel origins. Right carotid  system: Patent without evidence of stenosis, dissection, or significant atherosclerosis. Left carotid system: Patent without evidence of stenosis, dissection, or significant atherosclerosis. Vertebral arteries: Patent without evidence of stenosis, dissection, or significant atherosclerosis. Mildly dominant left vertebral artery. Skeleton: Mild cervical spondylosis. Other neck: No evidence of acute abnormality or mass. Upper chest: Clear lung apices. Review of the MIP images confirms the above findings CTA HEAD FINDINGS Anterior circulation: The internal carotid arteries are widely patent from skull base to carotid termini. ACAs and MCAs are patent without evidence of proximal branch occlusion or significant proximal stenosis. The right A1 segment is hypoplastic. No aneurysm is identified. Posterior circulation: The intracranial vertebral arteries are widely patent to the basilar. Patent bilateral PICA, right AICA, and left SCA origins are visualized. The basilar artery is patent with mild narrowing distally. There is a small left posterior communicating artery. The PCAs are patent without evidence of significant stenosis. No aneurysm is identified. Venous sinuses: Patent. Anatomic variants: Hypoplastic right A1 segment. Review of the MIP images confirms the above findings IMPRESSION: 1. No emergent large vessel occlusion. 2. Patent circle of Willis without proximal branch occlusion or flow limiting proximal stenosis. 3. Mild distal basilar artery narrowing. 4. Widely patent cervical carotid and vertebral arteries. These results were communicated to Dr. Roda ShuttersXu at 12:59 pm on 11/23/2017 by text page via the [facility] messaging system. Electronically Signed   By: Sebastian AcheAllen  Grady M.D.   On: 11/23/2017 13:10   Mr Brain Wo Contrast  Result Date: 11/23/2017 CLINICAL DATA:  Sudden onset of left-sided weakness, slurred speech and facial droop. EXAM: MRI HEAD WITHOUT CONTRAST TECHNIQUE: Multiplanar, multiecho pulse sequences of the  brain and surrounding structures were obtained without intravenous contrast. COMPARISON:  CT study same day.  MRI 11/05/2015. FINDINGS: Brain: No sign of acute infarction, mass lesion, hemorrhage, hydrocephalus or extra-axial collection. There is an old right frontal cortical infarction. Small lacunar infarct left caudate head is unchanged. No other abnormality. Vascular: Major vessels at the base of the brain show flow. Venous sinuses appear patent. Skull and upper cervical spine: Normal. Sinuses/Orbits: Clear/normal. Other: None significant. IMPRESSION: No acute finding. No change since 2017. Old small right frontal cortical infarction. Old small lacunar infarct left caudate head. Electronically Signed   By: Loraine LericheMark  Shogry M.D.   On: 11/23/2017 13:36   Ct Head Code Stroke Wo Contrast  Result Date: 11/23/2017 CLINICAL DATA:  Code stroke. 37 year old female with weakness and slurred speech. Chronic right MCA territory encephalomalacia. EXAM: CT HEAD WITHOUT CONTRAST TECHNIQUE: Contiguous axial images were obtained from the base of the skull through the vertex without intravenous contrast. COMPARISON:  Brain MRI 11/05/2015, and earlier. FINDINGS: Brain: Stable small area of chronic encephalomalacia in the right middle frontal gyrus anteriorly seen on series 3, image 21 today. Gray-white matter differentiation elsewhere remains normal. For stable underlying cerebral volume, with chronic volume loss in the posterior body of the corpus callosum. No midline shift, ventriculomegaly, mass effect, evidence of mass lesion, intracranial hemorrhage or evidence of cortically based acute infarction. Vascular: No suspicious intracranial vascular hyperdensity. Skull: Stable, negative. Sinuses/Orbits: Visualized paranasal sinuses and mastoids are stable and well pneumatized. Other: Visualized orbits and scalp soft tissues are within normal limits. ASPECTS [facility] Stroke Program Early CT Score) - Ganglionic level infarction  (caudate, lentiform nuclei, internal capsule, insula, M1-M3 cortex): 7 - Supraganglionic infarction (M4-M6 cortex): 3 Total score (0-10 with 10 being normal): 10 IMPRESSION: 1. No acute intracranial hemorrhage or cortically based infarct identified. 2. Stable appearance of the brain since 2017 with a small area of chronic encephalomalacia in the right middle frontal gyrus, and chronic or congenital volume loss in the posterior corpus callosum. ASPECTS is 10. 3. These results were communicated to Dr. Jerel Shepherd at 12:34 pmon 7/16/2019by text page via the [facility] messaging system. Electronically Signed   By: Odessa Fleming M.D.   On: 11/23/2017 12:35    EKG: Independently reviewed. LVH, nonspecific ST-T wave changes  Assessment/Plan    R hemiparesis/R facial droop -Neurology consult appreciated -suspect Worsening of prior stroke deficits in the setting of cocaine, non compliance with antiplatelet agents vs Conversion d/o vs Complicated migraine -MRI negative for new CVA -resume ASA/plavix/statin -PT/OT eval  -continue home psych regimen -inpatient Psych consult    DM -continue levemir, SSI   H/o multiple strokes/PFO -MRI negative for new CVA -Continue aspirin and Plavix   Schizophrenia/bipolar disorder -Continue home regimen of respiratory   Uncontrolled hypertension -Likely related to recent cocaine abuse, UDS pending -Will gradually lower her blood pressure, use hydralazine PRN -add amlodipine  DVT prophylaxis: lovenox Code Status: Full Code Family Communication: NO family at bedside  Disposition Plan: Home tomorrow pending Pt/OT eval Consults called: Neurology -seen  Admission status: observation   Zannie Cove MD [facility] Pager [phone]  If 7PM-7AM, please contact night-coverage [email or web address] Password [facility]  11/23/2017, 5:17 PM

## 2017-11-23 NOTE — ED Provider Notes (Signed)
Emergency Department Provider Note   I have reviewed the triage vital signs and the nursing notes.   HISTORY  Chief Complaint Code Stroke   HPI Etola Mull is a 37 y.o. female with a history of stroke with also conversion disorder, seizures and other medical problems documented below the presents emergency room today with 20 minutes of left-sided symptoms.  Patient state his neurologist and other staff that she had acute onset of left-sided weakness and slurred speech and facial droop 20 minutes prior to arrival.  On review of records she had does have a history of multiple admissions for small strokes and for conversion disorder.  She was not able to give much other history. No other associated or modifying symptoms.    Past Medical History:  Diagnosis Date  . Anemia   . Anginal pain (HCC)   . Bipolar disorder (HCC)   . Bronchial asthma   . Chronic lower back pain   . DM (diabetes mellitus) type II uncontrolled, periph vascular disorder (HCC)    notes 01/03/2015  . DVT (deep venous thrombosis) (HCC) after 2008   "BLE"  . GERD (gastroesophageal reflux disease)   . Heart murmur    "born w/one"  . Hyperlipemia   . Hypertension   . Migraine    "weekly" (03/19/2015)  . PFO (patent foramen ovale)    large, per TEE 03/01/2015  . Schizophrenia (HCC)   . 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 Stroke (HCC) 04/2014; 09/2014(/notes 04/25/2015);  12/2014; 01/2015; 02/2015   "left side weakness; speech problems since" (03/19/2015)  . Stroke [facility]) 03/19/2015   "left side weakness; speech problems" (03/19/2015)    Patient Active Problem List   Diagnosis Date Noted  . Left-sided weakness   . ICH (intracerebral hemorrhage) (HCC) 09/24/2015  . Anaphylaxis 07/01/2015  . Presence of IVC filter   . Essential hypertension, benign   . Pain in the chest   . Pain   . HLD (hyperlipidemia)   . CVA (cerebral infarction) 03/19/2015  . Chest pain   . Cerebral infarction due to  unspecified mechanism   . UTI (lower urinary tract infection)   . Cryptogenic stroke (HCC)   . Type 2 diabetes mellitus with diabetic neuropathy, with long-term current use of insulin (HCC)   . Acute thromboembolic cerebrovascular accident (CVA) (HCC)   . PFO (patent foramen ovale)   . 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 Schizophrenia (HCC)   . Bipolar I disorder (HCC)   . Seizure disorder (HCC)   . Acute encephalopathy 02/27/2015  . Stroke (HCC) 01/03/2015  . Essential hypertension 01/03/2015  . DM (diabetes mellitus) type II controlled, neurological manifestation (HCC) 01/03/2015  . Hyperlipidemia 01/03/2015  . Hemiparesis affecting left side as late effect of cerebrovascular accident (HCC) 01/03/2015  . Tobacco abuse 01/03/2015  . Marijuana abuse 01/03/2015  . Asthma 01/03/2015  . History of DVT of lower extremity 01/03/2015    Past Surgical History:  Procedure Laterality Date  . CESAREAN SECTION  2008  . HERNIA REPAIR    . TEE WITHOUT CARDIOVERSION N/A 03/01/2015   Procedure: TRANSESOPHAGEAL ECHOCARDIOGRAM (TEE);  Surgeon: Thurmon Fair, MD;  Location: [facility] ENDOSCOPY;  Service: Cardiovascular;  Laterality: N/A;  . TUBAL LIGATION  2008  . UMBILICAL HERNIA REPAIR  "@ birth"  . VSD REPAIR      as a baby    Current Outpatient Rx  . Order #: [id number] Class: Historical Med  .  Order #: [id number] Class: Print  . Order #: [id number] Class: Print  . Order #: [id number] Class: Normal  . Order #: [id number] Class: Normal  . Order #: [id number] Class: Print  . Order #: [id number] Class: Historical Med  . Order #: [id number] Class: Print  . Order #: [id number] Class: Print  . Order #: [id number] Class: Print  . Order #: [id number] Class: Print  . Order #: [id number] Class: Print  . Order #: [id number] Class: Print  . Order #: [id number] Class: Print    Allergies Carrot [daucus carota]; Eggs or egg-derived products; and Mushroom extract  complex  Family History  Problem Relation Age of Onset  . Hypertension Mother   . Clotting disorder Mother        hypercoagulable disorder on mother side  . Heart attack Mother     Social History Social History   Tobacco Use  . Smoking status: Current Every Day Smoker    Packs/day: 0.33    Years: 21.00    Pack years: 6.93    Types: Cigarettes  . Smokeless tobacco: Never Used  Substance Use Topics  . Alcohol use: Yes    Alcohol/week: 0.0 oz    Comment: 03/19/2015 "a couple times/month"  . Drug use: Yes    Types: Marijuana    Comment: 03/19/2015 "a couple times/month"    Review of Systems  All other systems negative except as documented in the HPI. All pertinent positives and negatives as reviewed in the HPI. ____________________________________________   PHYSICAL EXAM:  VITAL SIGNS: ED Triage Vitals  Enc Vitals Group     BP 11/23/17 1206 (!) 172/131     Pulse Rate 11/23/17 1206 83     Resp 11/23/17 1206 18     Temp 11/23/17 1206 98.5 F (36.9 C)     Temp Source 11/23/17 1206 Oral     SpO2 11/23/17 1206 100 %     Weight 11/23/17 1206 109 lb 6.4 oz (49.6 kg)     Height 11/23/17 1339 5\' 3"  (1.6 m)    Constitutional: Alert and oriented. Well appearing and in no acute distress. Eyes: Conjunctivae are normal. PERRL. EOMI. Head: Atraumatic. Nose: No congestion/rhinnorhea. Mouth/Throat: Mucous membranes are moist.  Oropharynx non-erythematous. Neck: No stridor.  No meningeal signs.   Cardiovascular: Normal rate, regular rhythm. Good peripheral circulation. Grossly normal heart sounds.   Respiratory: Normal respiratory effort.  No retractions. Lungs CTAB. Gastrointestinal: Soft and nontender. No distention.  Musculoskeletal: No lower extremity tenderness nor edema. No gross deformities of extremities. Neurologic: Left facial droop, left arm and leg flaccid weakness.  Not able to give a great history.  Is stuttering but no dysarthria otherwise. Skin:  Skin is warm,  dry and intact. No rash noted.  ____________________________________________   LABS (all labs ordered are listed, but only abnormal results are displayed)  Labs Reviewed  CBC - Abnormal; Notable for the following components:      Result Value   Hemoglobin 10.9 (*)    HCT 35.9 (*)    MCH 25.4 (*)    RDW 15.9 (*)    All other components within normal limits  COMPREHENSIVE METABOLIC PANEL - Abnormal; Notable for the following components:   Creatinine, Ser 1.44 (*)    GFR calc non Af Amer 46 (*)    GFR calc Af Amer 53 (*)    All other components within normal limits  I-STAT CHEM 8, ED - Abnormal; Notable for the following components:   Creatinine, Ser 1.50 (*)    Hemoglobin 11.6 (*)    HCT 34.0 (*)  All other components within normal limits  PROTIME-INR  APTT  DIFFERENTIAL  RAPID URINE DRUG SCREEN, HOSP PERFORMED  I-STAT TROPONIN, ED  CBG MONITORING, ED  I-STAT BETA HCG BLOOD, ED ([facility], [facility], [facility] ONLY)   ____________________________________________  EKG   EKG Interpretation  Date/Time:  Tuesday November 23 2017 13:35:14 EDT Ventricular Rate:  63 PR Interval:    QRS Duration: 86 QT Interval:  434 QTC Calculation: 445 R Axis:   91 Text Interpretation:  Sinus rhythm Borderline right axis deviation Left ventricular hypertrophy Nonspecific T abnrm, anterolateral leads Baseline wander in lead(s) I III aVL ST change in V3 slightly different than june 2017 Confirmed by Marily MemosMesner, Bethsaida Siegenthaler [phone]) on 11/23/2017 4:48:24 PM       ____________________________________________  RADIOLOGY  Ct Angio Head W Or Wo Contrast  Result Date: 11/23/2017 CLINICAL DATA:  Slurred speech and right-sided weakness. Prior stroke. EXAM: CT ANGIOGRAPHY HEAD AND NECK TECHNIQUE: Multidetector CT imaging of the head and neck was performed using the standard protocol during bolus administration of intravenous contrast. Multiplanar CT image reconstructions and MIPs were obtained to evaluate the vascular anatomy.  Carotid stenosis measurements (when applicable) are obtained utilizing NASCET criteria, using the distal internal carotid diameter as the denominator. CONTRAST:  50mL ISOVUE-370 IOPAMIDOL (ISOVUE-370) INJECTION 76% COMPARISON:  Head MRA 09/25/2015. FINDINGS: CTA NECK FINDINGS Aortic arch: Normal variant aortic arch branching pattern with common origin of the brachiocephalic and left common carotid arteries. Widely patent arch vessel origins. Right carotid system: Patent without evidence of stenosis, dissection, or significant atherosclerosis. Left carotid system: Patent without evidence of stenosis, dissection, or significant atherosclerosis. Vertebral arteries: Patent without evidence of stenosis, dissection, or significant atherosclerosis. Mildly dominant left vertebral artery. Skeleton: Mild cervical spondylosis. Other neck: No evidence of acute abnormality or mass. Upper chest: Clear lung apices. Review of the MIP images confirms the above findings CTA HEAD FINDINGS Anterior circulation: The internal carotid arteries are widely patent from skull base to carotid termini. ACAs and MCAs are patent without evidence of proximal branch occlusion or significant proximal stenosis. The right A1 segment is hypoplastic. No aneurysm is identified. Posterior circulation: The intracranial vertebral arteries are widely patent to the basilar. Patent bilateral PICA, right AICA, and left SCA origins are visualized. The basilar artery is patent with mild narrowing distally. There is a small left posterior communicating artery. The PCAs are patent without evidence of significant stenosis. No aneurysm is identified. Venous sinuses: Patent. Anatomic variants: Hypoplastic right A1 segment. Review of the MIP images confirms the above findings IMPRESSION: 1. No emergent large vessel occlusion. 2. Patent circle of Willis without proximal branch occlusion or flow limiting proximal stenosis. 3. Mild distal basilar artery narrowing. 4.  Widely patent cervical carotid and vertebral arteries. These results were communicated to Dr. Roda ShuttersXu at 12:59 pm on 11/23/2017 by text page via the [facility] messaging system. Electronically Signed   By: Sebastian AcheAllen  Grady M.D.   On: 11/23/2017 13:10   Ct Angio Neck W Or Wo Contrast  Result Date: 11/23/2017 CLINICAL DATA:  Slurred speech and right-sided weakness. Prior stroke. EXAM: CT ANGIOGRAPHY HEAD AND NECK TECHNIQUE: Multidetector CT imaging of the head and neck was performed using the standard protocol during bolus administration of intravenous contrast. Multiplanar CT image reconstructions and MIPs were obtained to evaluate the vascular anatomy. Carotid stenosis measurements (when applicable) are obtained utilizing NASCET criteria, using the distal internal carotid diameter as the denominator. CONTRAST:  50mL ISOVUE-370 IOPAMIDOL (ISOVUE-370) INJECTION 76% COMPARISON:  Head MRA 09/25/2015. FINDINGS:  CTA NECK FINDINGS Aortic arch: Normal variant aortic arch branching pattern with common origin of the brachiocephalic and left common carotid arteries. Widely patent arch vessel origins. Right carotid system: Patent without evidence of stenosis, dissection, or significant atherosclerosis. Left carotid system: Patent without evidence of stenosis, dissection, or significant atherosclerosis. Vertebral arteries: Patent without evidence of stenosis, dissection, or significant atherosclerosis. Mildly dominant left vertebral artery. Skeleton: Mild cervical spondylosis. Other neck: No evidence of acute abnormality or mass. Upper chest: Clear lung apices. Review of the MIP images confirms the above findings CTA HEAD FINDINGS Anterior circulation: The internal carotid arteries are widely patent from skull base to carotid termini. ACAs and MCAs are patent without evidence of proximal branch occlusion or significant proximal stenosis. The right A1 segment is hypoplastic. No aneurysm is identified. Posterior circulation: The intracranial  vertebral arteries are widely patent to the basilar. Patent bilateral PICA, right AICA, and left SCA origins are visualized. The basilar artery is patent with mild narrowing distally. There is a small left posterior communicating artery. The PCAs are patent without evidence of significant stenosis. No aneurysm is identified. Venous sinuses: Patent. Anatomic variants: Hypoplastic right A1 segment. Review of the MIP images confirms the above findings IMPRESSION: 1. No emergent large vessel occlusion. 2. Patent circle of Willis without proximal branch occlusion or flow limiting proximal stenosis. 3. Mild distal basilar artery narrowing. 4. Widely patent cervical carotid and vertebral arteries. These results were communicated to Dr. Roda Shutters at 12:59 pm on 11/23/2017 by text page via the [facility] messaging system. Electronically Signed   By: Sebastian Ache M.D.   On: 11/23/2017 13:10   Mr Brain Wo Contrast  Result Date: 11/23/2017 CLINICAL DATA:  Sudden onset of left-sided weakness, slurred speech and facial droop. EXAM: MRI HEAD WITHOUT CONTRAST TECHNIQUE: Multiplanar, multiecho pulse sequences of the brain and surrounding structures were obtained without intravenous contrast. COMPARISON:  CT study same day.  MRI 11/05/2015. FINDINGS: Brain: No sign of acute infarction, mass lesion, hemorrhage, hydrocephalus or extra-axial collection. There is an old right frontal cortical infarction. Small lacunar infarct left caudate head is unchanged. No other abnormality. Vascular: Major vessels at the base of the brain show flow. Venous sinuses appear patent. Skull and upper cervical spine: Normal. Sinuses/Orbits: Clear/normal. Other: None significant. IMPRESSION: No acute finding. No change since 2017. Old small right frontal cortical infarction. Old small lacunar infarct left caudate head. Electronically Signed   By: Paulina Fusi M.D.   On: 11/23/2017 13:36   Ct Head Code Stroke Wo Contrast  Result Date: 11/23/2017 CLINICAL DATA:   Code stroke. 37 year old female with weakness and slurred speech. Chronic right MCA territory encephalomalacia. EXAM: CT HEAD WITHOUT CONTRAST TECHNIQUE: Contiguous axial images were obtained from the base of the skull through the vertex without intravenous contrast. COMPARISON:  Brain MRI 11/05/2015, and earlier. FINDINGS: Brain: Stable small area of chronic encephalomalacia in the right middle frontal gyrus anteriorly seen on series 3, image 21 today. Gray-white matter differentiation elsewhere remains normal. For stable underlying cerebral volume, with chronic volume loss in the posterior body of the corpus callosum. No midline shift, ventriculomegaly, mass effect, evidence of mass lesion, intracranial hemorrhage or evidence of cortically based acute infarction. Vascular: No suspicious intracranial vascular hyperdensity. Skull: Stable, negative. Sinuses/Orbits: Visualized paranasal sinuses and mastoids are stable and well pneumatized. Other: Visualized orbits and scalp soft tissues are within normal limits. ASPECTS [facility] Stroke Program Early CT Score) - Ganglionic level infarction (caudate, lentiform nuclei, internal capsule, insula, M1-M3 cortex): 7 -  Supraganglionic infarction (M4-M6 cortex): 3 Total score (0-10 with 10 being normal): 10 IMPRESSION: 1. No acute intracranial hemorrhage or cortically based infarct identified. 2. Stable appearance of the brain since 2017 with a small area of chronic encephalomalacia in the right middle frontal gyrus, and chronic or congenital volume loss in the posterior corpus callosum. ASPECTS is 10. 3. These results were communicated to Dr. Jerel Shepherd at 12:34 pmon 7/16/2019by text page via the [facility] messaging system. Electronically Signed   By: Odessa Fleming M.D.   On: 11/23/2017 12:35    ____________________________________________   PROCEDURES  Procedure(s) performed:   Procedures   ____________________________________________   INITIAL IMPRESSION / ASSESSMENT AND  PLAN / ED COURSE  Headache cocktail given without resolution of symptoms.  Patient with normal work-up in the emergency room making conversion disorder more likely.  However she is persistently hypertensive even despite labetalol.  Discussed with hospitalist and will admit for observation, physical therapy consult and recheck of vital signs to ensure her blood pressures are improving and blood pressure control as indicated.  Pertinent labs & imaging results that were available during my care of the patient were reviewed by me and considered in my medical decision making (see chart for details).  ____________________________________________  FINAL CLINICAL IMPRESSION(S) / ED DIAGNOSES  Final diagnoses:  None     MEDICATIONS GIVEN DURING THIS VISIT:  Medications  iopamidol (ISOVUE-370) 76 % injection 50 mL (50 mLs Intravenous Contrast Given 11/23/17 1247)  labetalol (NORMODYNE,TRANDATE) injection 5 mg (5 mg Intravenous Given 11/23/17 1420)  metoCLOPramide (REGLAN) injection 10 mg (10 mg Intravenous Given 11/23/17 1425)  lactated ringers bolus 1,000 mL (1,000 mLs Intravenous New Bag/Given 11/23/17 1433)  diphenhydrAMINE (BENADRYL) injection 25 mg (25 mg Intravenous Given 11/23/17 1423)  dexamethasone (DECADRON) injection 10 mg (10 mg Intravenous Given 11/23/17 1428)     NEW OUTPATIENT MEDICATIONS STARTED DURING THIS VISIT:  New Prescriptions   No medications on file    Note:  This note was prepared with assistance of Dragon voice recognition software. Occasional wrong-word or sound-a-like substitutions may have occurred due to the inherent limitations of voice recognition software.   Marily Memos, MD 11/23/17 [phone]

## 2017-11-23 NOTE — ED Triage Notes (Signed)
Pt here from the bus with left side facial droop and left side weakness. Pt is able to tell me that she was normal ago.

## 2017-11-24 ENCOUNTER — Other Ambulatory Visit: Payer: Self-pay

## 2017-11-24 ENCOUNTER — Encounter (HOSPITAL_COMMUNITY): Payer: Self-pay

## 2017-11-24 DIAGNOSIS — F418 Other specified anxiety disorders: Secondary | ICD-10-CM | POA: Diagnosis not present

## 2017-11-24 DIAGNOSIS — G4700 Insomnia, unspecified: Secondary | ICD-10-CM

## 2017-11-24 DIAGNOSIS — G8191 Hemiplegia, unspecified affecting right dominant side: Secondary | ICD-10-CM | POA: Diagnosis not present

## 2017-11-24 DIAGNOSIS — I69952 Hemiplegia and hemiparesis following unspecified cerebrovascular disease affecting left dominant side: Secondary | ICD-10-CM | POA: Diagnosis not present

## 2017-11-24 DIAGNOSIS — E1140 Type 2 diabetes mellitus with diabetic neuropathy, unspecified: Secondary | ICD-10-CM

## 2017-11-24 DIAGNOSIS — F449 Dissociative and conversion disorder, unspecified: Secondary | ICD-10-CM | POA: Diagnosis not present

## 2017-11-24 DIAGNOSIS — F1290 Cannabis use, unspecified, uncomplicated: Secondary | ICD-10-CM

## 2017-11-24 DIAGNOSIS — R531 Weakness: Secondary | ICD-10-CM | POA: Diagnosis not present

## 2017-11-24 DIAGNOSIS — F17210 Nicotine dependence, cigarettes, uncomplicated: Secondary | ICD-10-CM | POA: Diagnosis not present

## 2017-11-24 DIAGNOSIS — F1420 Cocaine dependence, uncomplicated: Secondary | ICD-10-CM

## 2017-11-24 DIAGNOSIS — F209 Schizophrenia, unspecified: Secondary | ICD-10-CM | POA: Diagnosis not present

## 2017-11-24 DIAGNOSIS — Z794 Long term (current) use of insulin: Secondary | ICD-10-CM

## 2017-11-24 LAB — GLUCOSE, CAPILLARY
GLUCOSE-CAPILLARY: 95 mg/dL (ref 70–99)
GLUCOSE-CAPILLARY: 139 mg/dL — AB (ref 70–99)
Glucose-Capillary: 146 mg/dL — ABNORMAL HIGH (ref 70–99)
Glucose-Capillary: 106 mg/dL — ABNORMAL HIGH (ref 70–99)

## 2017-11-24 LAB — CBC
HEMATOCRIT: 36.3 % (ref 36.0–46.0)
Hemoglobin: 11.2 g/dL — ABNORMAL LOW (ref 12.0–15.0)
MCH: 25.6 pg — AB (ref 26.0–34.0)
MCHC: 30.9 g/dL (ref 30.0–36.0)
MCV: 82.9 fL (ref 78.0–100.0)
Platelets: 200 10*3/uL (ref 150–400)
RBC: 4.38 MIL/uL (ref 3.87–5.11)
RDW: 15.9 % — AB (ref 11.5–15.5)
WBC: 5.4 10*3/uL (ref 4.0–10.5)

## 2017-11-24 LAB — COMPREHENSIVE METABOLIC PANEL
ALBUMIN: 3.1 g/dL — AB (ref 3.5–5.0)
ALT: 13 U/L (ref 0–44)
ANION GAP: 7 (ref 5–15)
AST: 17 U/L (ref 15–41)
Alkaline Phosphatase: 76 U/L (ref 38–126)
BILIRUBIN TOTAL: 0.5 mg/dL (ref 0.3–1.2)
BUN: 19 mg/dL (ref 6–20)
CO2: 24 mmol/L (ref 22–32)
Calcium: 9 mg/dL (ref 8.9–10.3)
Chloride: 108 mmol/L (ref 98–111)
Creatinine, Ser: 1.54 mg/dL — ABNORMAL HIGH (ref 0.44–1.00)
GFR, EST AFRICAN AMERICAN: 49 mL/min — AB (ref >60)
GFR, EST NON AFRICAN AMERICAN: 42 mL/min — AB (ref >60)
GLUCOSE: 119 mg/dL — AB (ref 70–99)
POTASSIUM: 4.3 mmol/L (ref 3.5–5.1)
Sodium: 139 mmol/L (ref 135–145)
TOTAL PROTEIN: 6.5 g/dL (ref 6.5–8.1)

## 2017-11-24 LAB — RAPID URINE DRUG SCREEN, HOSP PERFORMED
Amphetamines: NOT DETECTED
Benzodiazepines: NOT DETECTED
Cocaine: POSITIVE — AB
Opiates: NOT DETECTED
TETRAHYDROCANNABINOL: POSITIVE — AB

## 2017-11-24 LAB — HIV ANTIBODY (ROUTINE TESTING W REFLEX): HIV SCREEN 4TH GENERATION: NONREACTIVE

## 2017-11-24 MED ORDER — ENOXAPARIN SODIUM 40 MG/0.4ML ~~LOC~~ SOLN
40.0000 mg | SUBCUTANEOUS | Status: DC
  Administered 2017-11-24: 40 mg via SUBCUTANEOUS
  Filled 2017-11-24: qty 0.4

## 2017-11-24 MED ORDER — TRAZODONE HCL 150 MG PO TABS: 150.0000 mg | ORAL_TABLET | Freq: Every day | ORAL | 0 refills | Status: DC

## 2017-11-24 NOTE — Progress Notes (Addendum)
With pt's permission, CSW [facility]. [facility] updating on pt's situation.   CSW provided bus passes.   Update 7:40 PM: CSW received phone call from floor. Physical therapy recommended 5' wheels for pt. No equipment ordered for pt. Pt stated that she cannot walk at time of discharge.  CSW will defer to floor RN CM.   Montine CircleKelsy Jawanda Passey, Silverio LayLCSWA [facility]  [phone]

## 2017-11-24 NOTE — Evaluation (Signed)
Occupational Therapy Evaluation Patient Details Name: Tanya Mayer MRN: [id number] DOB: 02-06-1981 Today's Date: 11/24/2017    History of Present Illness Patient is a 37 y/o female presenting with acute onset of headache, chest pain, dizziness, and L sided weakness. Patient admits noncompliance with medications and using marijuana and cocaine yesterday. No stroke noted on MRI or CTA/CTP, concern for conversion disorder (per chart review). Patient with a PMH significant for schizophrenia, bipolar disorder, conversion disorders, multiple prior strokes, large PFO, tobacco abuse, polysubstance abuse and diabetes.   Clinical Impression   PT admitted with L side weakness. Pt currently with functional limitiations due to the deficits listed below (see OT problem list). Pt currently with inconsistent L side weakness throughout the session. Recommendation made for SNf due to homeless and no transportation or housing to attend outpatient.  Pt will benefit from skilled OT to increase their independence and safety with adls and balance to allow discharge SNF.If transportation for outpatient could be arranged then agreeable to this setting. Please see recommendation for pshychology and social work. Pt needs help with community based housing and medications.      Follow Up Recommendations  SNF(pt is homeless and will not have adequate transportation OPT)    Equipment Recommendations  None recommended by OT    Recommendations for Other Services Other (comment)(psych and SW )     Precautions / Restrictions Precautions Precautions: Fall Restrictions Weight Bearing Restrictions: No      Mobility Bed Mobility Overal bed mobility: Needs Assistance Bed Mobility: Sit to Supine       Sit to supine: Supervision   General bed mobility comments: pt using R UE to lift L LE onto bed surface  Transfers Overall transfer level: Needs assistance   Transfers: Sit to/from Stand Sit to Stand: Min  guard         General transfer comment: pt with hand held (A) on L for all transfers. pt requires min (A) for transfer chair to sink level    Balance Overall balance assessment: Mild deficits observed, not formally tested                                         ADL either performed or assessed with clinical judgement   ADL Overall ADL's : Needs assistance/impaired Eating/Feeding: Set up Eating/Feeding Details (indicate cue type and reason): using R UE only Grooming: Set up;Standing Grooming Details (indicate cue type and reason): pt using mouth to open items and R Ue to complete task. pt with L UE on sink for stability     Lower Body Bathing: Supervison/ safety(standing)           Toilet Transfer: Minimal assistance;Grab Paramedic Details (indicate cue type and reason): needs help tocontrol sitting and (A) to power up  Toileting- Clothing Manipulation and Hygiene: Supervision/safety;Sitting/lateral lean       Functional mobility during ADLs: Minimal assistance General ADL Comments: pt noted to advance L LE and keep knee in extension. pt able to progress with transfer but with knee extension.      Vision         Perception     Praxis      Pertinent Vitals/Pain Pain Assessment: Faces Faces Pain Scale: Hurts even more Pain Location: headache Pain Descriptors / Indicators: Headache Pain Intervention(s): Monitored during session;Repositioned;Premedicated before session     Hand Dominance Right   Extremity/Trunk  Assessment Upper Extremity Assessment Upper Extremity Assessment: LUE deficits/detail LUE Deficits / Details: pt appears flaccid but tone noted in hard with hand held (A). pt noted to activate bicep during transfer for balance. inconsistent tone noted in arm throughout session   Lower Extremity Assessment Lower Extremity Assessment: Defer to PT evaluation   Cervical / Trunk Assessment Cervical / Trunk  Assessment: Normal   Communication Communication Communication: No difficulties   Cognition Arousal/Alertness: Awake/alert Behavior During Therapy: WFL for tasks assessed/performed Overall Cognitive Status: Within Functional Limits for tasks assessed                                     General Comments       Exercises     Shoulder Instructions      Home Living Family/patient expects to be discharged to:: Shelter/Homeless Living Arrangements: Spouse/significant other                               Additional Comments: patient and spouse reporting homelessness. pt reports x1 month homeless and shelters are full      Prior Functioning/Environment Level of Independence: Independent                 OT Problem List: Decreased strength;Decreased activity tolerance;Impaired balance (sitting and/or standing);Decreased cognition;Decreased safety awareness;Decreased knowledge of use of DME or AE;Decreased knowledge of precautions;Impaired UE functional use;Decreased range of motion      OT Treatment/Interventions: Self-care/ADL training;Therapeutic exercise;Neuromuscular education;DME and/or AE instruction;Therapeutic activities;Cognitive remediation/compensation;Patient/family education;Balance training    OT Goals(Current goals can be found in the care plan section) Acute Rehab OT Goals Patient Stated Goal: none stated OT Goal Formulation: With patient/family Time For Goal Achievement: 12/08/17 Potential to Achieve Goals: Good  OT Frequency: Min 2X/week   Barriers to D/C:    homeless- reports shelters are full. spouse off on Wednesdays per patient and present this session asleep       Co-evaluation              AM-PAC PT "6 Clicks" Daily Activity     Outcome Measure Help from another person eating meals?: A Little Help from another person taking care of personal grooming?: A Little Help from another person toileting, which includes  using toliet, bedpan, or urinal?: A Little Help from another person bathing (including washing, rinsing, drying)?: A Little Help from another person to put on and taking off regular upper body clothing?: A Little Help from another person to put on and taking off regular lower body clothing?: A Little 6 Click Score: 18   End of Session Nurse Communication: Mobility status;Precautions  Activity Tolerance: Patient tolerated treatment well Patient left: in bed;with call bell/phone within reach;with family/visitor present  OT Visit Diagnosis: Unsteadiness on feet (R26.81);Muscle weakness (generalized) (M62.81)                Time: 1100-1118 OT Time Calculation (min): 18 min Charges:  OT General Charges $OT Visit: 1 Visit OT Evaluation $OT Eval Moderate Complexity: 1 Mod G-Codes:      Mateo FlowJones, Tanya   OTR/L Pager: [phone] Office: [phone] .   Boone Mayer, Tanya Cuda B 11/24/2017, 2:38 PM

## 2017-11-24 NOTE — Discharge Summary (Signed)
Physician Discharge Summary  Tanya Mayer ZOX:[id number] DOB: Nov 09, 1980 DOA: 11/23/2017  PCP: [facility]  Admit date: 11/23/2017 Discharge date: 11/24/2017  Admitted From: home Discharge disposition: home   Recommendations for Outpatient Follow-Up:   1. Given resources for housing 2. 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  Diet recommendation: Low sodium, heart healthy.  Carbohydrate-modified  Wound care: None.  Code status: Full.   History of Present Illness:   Tanya Mayer is a 37 y.o. female with medical history significant for extensive psychiatric history with schizophrenia, bipolar disorder, conversion disorders, multiple prior strokes, large PFO, tobacco abuse, polysubstance abuse and diabetes presented to the ED with acute onset headache chest pain and dizziness left-sided weakness and left facial weakness. -Patient reports that she was on the bus when she suddenly had worsening headache followed by left-sided weakness facial droop and slurring of speech. -got off the bus and was subsequently brought to the emergency room by EMS. -patient admits noncompliance with medications and using marijuana and cocaine yesterday      Hospital Course by Problem:     R hemiparesis/R facial droop -Neurology consult  appreciated-? Conversion -worked with PT-- walked 70 feet-- also has been walking to the bathroom -MRI negative for new CVA -resume ASA/plavix/statin -continue home psych regimen -inpatient Psych consult appreciated-- follow up with [facility]    DM -continue levemir, SSI   H/o multiple strokes/PFO -MRI negative for new CVA -Continue aspirin and Plavix   Schizophrenia/bipolar disorder -Continue home regimen -has outpatient follow up   Uncontrolled hypertension -Likely related to recent cocaine abuse -normalized      Medical Consultants:   Neuro psych   Discharge Exam:   Vitals:   11/24/17 1220 11/24/17 1624  BP: 126/84 117/75  Pulse: 87 77  Resp: 20 18  Temp:    SpO2: 100% 100%   Vitals:   11/24/17 0642 11/24/17 0827 11/24/17 1220 11/24/17 1624  BP: (!) 137/95  126/84 117/75  Pulse: 73  87 77  Resp: 14 16 20 18   Temp:      TempSrc:      SpO2: 100%  100% 100%  Weight:      Height:         The results of significant diagnostics from this hospitalization (including imaging, microbiology, ancillary and laboratory) are listed below for reference.     Procedures and Diagnostic Studies:   Ct Angio Head W Or Wo Contrast  Result Date: 11/23/2017 CLINICAL DATA:  Slurred speech and right-sided weakness. Prior stroke. EXAM: CT ANGIOGRAPHY HEAD AND NECK TECHNIQUE: Multidetector CT imaging of the head and neck was performed using the standard protocol during bolus administration of intravenous contrast. Multiplanar CT image reconstructions and MIPs were obtained to evaluate the vascular anatomy. Carotid stenosis measurements (when applicable) are obtained utilizing NASCET criteria, using the distal internal carotid  diameter as the denominator. CONTRAST:  50mL ISOVUE-370 IOPAMIDOL (ISOVUE-370) INJECTION 76% COMPARISON:  Head MRA 09/25/2015. FINDINGS: CTA NECK FINDINGS Aortic arch: Normal variant aortic arch branching pattern with common origin of the brachiocephalic  and left common carotid arteries. Widely patent arch vessel origins. Right carotid system: Patent without evidence of stenosis, dissection, or significant atherosclerosis. Left carotid system: Patent without evidence of stenosis, dissection, or significant atherosclerosis. Vertebral arteries: Patent without evidence of stenosis, dissection, or significant atherosclerosis. Mildly dominant left vertebral artery. Skeleton: Mild cervical spondylosis. Other neck: No evidence of acute abnormality or mass. Upper chest: Clear lung apices. Review of the MIP images confirms the above findings CTA HEAD FINDINGS Anterior circulation: The internal carotid arteries are widely patent from skull base to carotid termini. ACAs and MCAs are patent without evidence of proximal branch occlusion or significant proximal stenosis. The right A1 segment is hypoplastic. No aneurysm is identified. Posterior circulation: The intracranial vertebral arteries are widely patent to the basilar. Patent bilateral PICA, right AICA, and left SCA origins are visualized. The basilar artery is patent with mild narrowing distally. There is a small left posterior communicating artery. The PCAs are patent without evidence of significant stenosis. No aneurysm is identified. Venous sinuses: Patent. Anatomic variants: Hypoplastic right A1 segment. Review of the MIP images confirms the above findings IMPRESSION: 1. No emergent large vessel occlusion. 2. Patent circle of Willis without proximal branch occlusion or flow limiting proximal stenosis. 3. Mild distal basilar artery narrowing. 4. Widely patent cervical carotid and vertebral arteries. These results were communicated to Dr. Roda Shutters at 12:59 pm on 11/23/2017 by text page via the [facility] messaging system. Electronically Signed   By: Sebastian Ache M.D.   On: 11/23/2017 13:10   Ct Angio Neck W Or Wo Contrast  Result Date: 11/23/2017 CLINICAL DATA:  Slurred speech and right-sided weakness. Prior stroke. EXAM: CT  ANGIOGRAPHY HEAD AND NECK TECHNIQUE: Multidetector CT imaging of the head and neck was performed using the standard protocol during bolus administration of intravenous contrast. Multiplanar CT image reconstructions and MIPs were obtained to evaluate the vascular anatomy. Carotid stenosis measurements (when applicable) are obtained utilizing NASCET criteria, using the distal internal carotid diameter as the denominator. CONTRAST:  50mL ISOVUE-370 IOPAMIDOL (ISOVUE-370) INJECTION 76% COMPARISON:  Head MRA 09/25/2015. FINDINGS: CTA NECK FINDINGS Aortic arch: Normal variant aortic arch branching pattern with common origin of the brachiocephalic and left common carotid arteries. Widely patent arch vessel origins. Right carotid system: Patent without evidence of stenosis, dissection, or significant atherosclerosis. Left carotid system: Patent without evidence of stenosis, dissection, or significant atherosclerosis. Vertebral arteries: Patent without evidence of stenosis, dissection, or significant atherosclerosis. Mildly dominant left vertebral artery. Skeleton: Mild cervical spondylosis. Other neck: No evidence of acute abnormality or mass. Upper chest: Clear lung apices. Review of the MIP images confirms the above findings CTA HEAD FINDINGS Anterior circulation: The internal carotid arteries are widely patent from skull base to carotid termini. ACAs and MCAs are patent without evidence of proximal branch occlusion or significant proximal stenosis. The right A1 segment is hypoplastic. No aneurysm is identified. Posterior circulation: The intracranial vertebral arteries are widely patent to the basilar. Patent bilateral PICA, right AICA, and left SCA origins are visualized. The basilar artery is patent with mild narrowing distally. There is a small left posterior communicating artery. The PCAs are patent without evidence of significant stenosis. No aneurysm is identified. Venous sinuses: Patent. Anatomic variants:  Hypoplastic right A1 segment. Review of the MIP images confirms the  above findings IMPRESSION: 1. No emergent large vessel occlusion. 2. Patent circle of Willis without proximal branch occlusion or flow limiting proximal stenosis. 3. Mild distal basilar artery narrowing. 4. Widely patent cervical carotid and vertebral arteries. These results were communicated to Dr. Roda Shutters at 12:59 pm on 11/23/2017 by text page via the [facility] messaging system. Electronically Signed   By: Sebastian Ache M.D.   On: 11/23/2017 13:10   Mr Brain Wo Contrast  Result Date: 11/23/2017 CLINICAL DATA:  Sudden onset of left-sided weakness, slurred speech and facial droop. EXAM: MRI HEAD WITHOUT CONTRAST TECHNIQUE: Multiplanar, multiecho pulse sequences of the brain and surrounding structures were obtained without intravenous contrast. COMPARISON:  CT study same day.  MRI 11/05/2015. FINDINGS: Brain: No sign of acute infarction, mass lesion, hemorrhage, hydrocephalus or extra-axial collection. There is an old right frontal cortical infarction. Small lacunar infarct left caudate head is unchanged. No other abnormality. Vascular: Major vessels at the base of the brain show flow. Venous sinuses appear patent. Skull and upper cervical spine: Normal. Sinuses/Orbits: Clear/normal. Other: None significant. IMPRESSION: No acute finding. No change since 2017. Old small right frontal cortical infarction. Old small lacunar infarct left caudate head. Electronically Signed   By: Paulina Fusi M.D.   On: 11/23/2017 13:36   Ct Head Code Stroke Wo Contrast  Result Date: 11/23/2017 CLINICAL DATA:  Code stroke. 37 year old female with weakness and slurred speech. Chronic right MCA territory encephalomalacia. EXAM: CT HEAD WITHOUT CONTRAST TECHNIQUE: Contiguous axial images were obtained from the base of the skull through the vertex without intravenous contrast. COMPARISON:  Brain MRI 11/05/2015, and earlier. FINDINGS: Brain: Stable small area of chronic  encephalomalacia in the right middle frontal gyrus anteriorly seen on series 3, image 21 today. Gray-white matter differentiation elsewhere remains normal. For stable underlying cerebral volume, with chronic volume loss in the posterior body of the corpus callosum. No midline shift, ventriculomegaly, mass effect, evidence of mass lesion, intracranial hemorrhage or evidence of cortically based acute infarction. Vascular: No suspicious intracranial vascular hyperdensity. Skull: Stable, negative. Sinuses/Orbits: Visualized paranasal sinuses and mastoids are stable and well pneumatized. Other: Visualized orbits and scalp soft tissues are within normal limits. ASPECTS [facility] Stroke Program Early CT Score) - Ganglionic level infarction (caudate, lentiform nuclei, internal capsule, insula, M1-M3 cortex): 7 - Supraganglionic infarction (M4-M6 cortex): 3 Total score (0-10 with 10 being normal): 10 IMPRESSION: 1. No acute intracranial hemorrhage or cortically based infarct identified. 2. Stable appearance of the brain since 2017 with a small area of chronic encephalomalacia in the right middle frontal gyrus, and chronic or congenital volume loss in the posterior corpus callosum. ASPECTS is 10. 3. These results were communicated to Dr. Jerel Shepherd at 12:34 pmon 7/16/2019by text page via the [facility] messaging system. Electronically Signed   By: Odessa Fleming M.D.   On: 11/23/2017 12:35     Labs:   Basic Metabolic Panel: Recent Labs  Lab 11/23/17 1206 11/23/17 1305 11/23/17 2104 11/24/17 0410  NA 137 138  --  139  K 3.9 3.9  --  4.3  CL 104 101  --  108  CO2 22  --   --  24  GLUCOSE 92 86  --  119*  BUN 9 9  --  19  CREATININE 1.44* 1.50* 1.49* 1.54*  CALCIUM 9.4  --   --  9.0   GFR Estimated Creatinine Clearance: 38.1 mL/min (A) (by C-G formula based on SCr of 1.54 mg/dL (H)). Liver Function Tests: Recent Labs  Lab 11/23/17 1206 11/24/17 0410  AST 23 17  ALT 17 13  ALKPHOS 79 76  BILITOT 0.5 0.5  PROT 7.2  6.5  ALBUMIN 3.8 3.1*   No results for input(s): LIPASE, AMYLASE in the last 168 hours. No results for input(s): AMMONIA in the last 168 hours. Coagulation profile Recent Labs  Lab 11/23/17 1206  INR SPECIMEN CLOTTED    CBC: Recent Labs  Lab 11/23/17 1206 11/23/17 1305 11/23/17 2104 11/24/17 0410  WBC 4.9  --  7.5 5.4  NEUTROABS 2.7  --   --   --   HGB 10.9* 11.6* 11.8* 11.2*  HCT 35.9* 34.0* 38.7 36.3  MCV 83.7  --  83.0 82.9  PLT 203  --  208 200   Cardiac Enzymes: No results for input(s): CKTOTAL, CKMB, CKMBINDEX, TROPONINI in the last 168 hours. BNP: Invalid input(s): POCBNP CBG: Recent Labs  Lab 11/23/17 1207 11/23/17 2125 11/24/17 0623 11/24/17 1139 11/24/17 1623  GLUCAP 93 136* 95 139* 146*   D-Dimer No results for input(s): DDIMER in the last 72 hours. Hgb A1c Recent Labs    11/23/17 2104  HGBA1C 5.6   Lipid Profile No results for input(s): CHOL, HDL, LDLCALC, TRIG, CHOLHDL, LDLDIRECT in the last 72 hours. Thyroid function studies No results for input(s): TSH, T4TOTAL, T3FREE, THYROIDAB in the last 72 hours.  Invalid input(s): FREET3 Anemia work up No results for input(s): VITAMINB12, FOLATE, FERRITIN, TIBC, IRON, RETICCTPCT in the last 72 hours. Microbiology No results found for this or any previous visit (from the past 240 hour(s)).   Discharge Instructions:   Discharge Instructions    Ambulatory referral to Physical Therapy   Complete by:  As directed    Diet - low sodium heart healthy   Complete by:  As directed    Diet Carb Modified   Complete by:  As directed    Discharge instructions   Complete by:  As directed    Needs 24 hour supervision Follow up with social work resources Follow up with [employer]  Stop cocaine   Increase activity slowly   Complete by:  As directed      Allergies as of 11/24/2017      Reactions   Daucus [place] Anaphylaxis, Itching   Eggs Or Egg-derived Products Anaphylaxis, Itching, Swelling   Mushroom  Extract Complex Anaphylaxis, Itching      Medication List    STOP taking these medications   ALPRAZolam 1 MG tablet Commonly known as:  XANAX   diphenhydramine-acetaminophen 25-500 MG Tabs tablet Commonly known as:  TYLENOL PM   hydrOXYzine 50 MG capsule Commonly known as:  VISTARIL     TAKE these medications   acetaminophen 500 MG tablet Commonly known as:  TYLENOL Take 500 mg by mouth every 6 (six) hours as needed for moderate pain.   albuterol 108 (90 Base) MCG/ACT inhaler Commonly known as:  PROVENTIL HFA;VENTOLIN HFA Inhale 2 puffs into the lungs every 6 (six) hours as needed for wheezing or shortness of breath.   aspirin 325 MG EC tablet Take 1 tablet (325 mg total) by mouth daily.   atorvastatin 40 MG tablet Commonly known as:  LIPITOR Take 1 tablet (40 mg total) by mouth daily at 6 PM.   clopidogrel 75 MG tablet Commonly known as:  PLAVIX Take 1 tablet (75 mg total) by mouth daily.   famotidine 20 MG tablet Commonly known as:  PEPCID Take 1 tablet (20 mg total) by mouth 2 (two) times daily.  gabapentin 300 MG capsule Commonly known as:  NEURONTIN Take 3 capsules (900 mg total) by mouth 3 (three) times daily.   insulin detemir 100 UNIT/ML injection Commonly known as:  LEVEMIR Inject 0.1 mLs (10 Units total) into the skin at bedtime. What changed:    how much to take  when to take this   QUEtiapine 300 MG tablet Commonly known as:  SEROQUEL Take 1 tablet (300 mg total) by mouth at bedtime.   risperiDONE 0.25 MG tablet Commonly known as:  RISPERDAL Take 1 tablet (0.25 mg total) by mouth 2 (two) times daily.   traZODone 150 MG tablet Commonly known as:  DESYREL Take 1 tablet (150 mg total) by mouth at bedtime. What changed:    medication strength  how much to take      Follow-up Information    [facility] Follow up in 1 week(s).   Specialty:  Internal Medicine Contact information: [address] [place]  [id number] [phone]        [facility] Follow up.   [facility]  [facility] Contact information: [address] [place] [address] [phone]            Time coordinating discharge: 25 min  Signed:  Joseph ArtJessica U Wilmetta Speiser  [facility] 11/24/2017, 6:39 PM

## 2017-11-24 NOTE — Care Management Note (Signed)
Case Management Note  Patient Details  Name: Tanya Mayer MRN: [id number] Date of Birth: 03/25/1981  Subjective/Objective:                 Patient admitted with stroke symptoms, -MRI, lives at home w spouse, patient admits noncompliance with medications and using marijuana and cocaine yesterday.  PCP [facility] Medicaid PT OT evals pending   Action/Plan:   Expected Discharge Date:                  Expected Discharge Plan:  Home/Self Care  In-House Referral:     Discharge planning Services  CM Consult  Post Acute Care Choice:    Choice offered to:     DME Arranged:    DME Agency:     HH Arranged:    HH Agency:     Status of Service:  In process, will continue to follow  If discussed at Long Length of Stay Meetings, dates discussed:    Additional Comments:  Lawerance SabalDebbie Vansh Reckart, RN 11/24/2017, 5:17 PM

## 2017-11-24 NOTE — Progress Notes (Signed)
Progress Note    Tanya Mayer  ZOX:[id number] DOB: 1981/02/09  DOA: 11/23/2017 PCP: [facility]    Brief Narrative:     Medical records reviewed and are as summarized below:  Tanya Mayer is an 37 y.o. female with medical history significant for extensive psychiatric history with schizophrenia, bipolar disorder, conversion disorders, multiple prior strokes, large PFO, tobacco abuse, polysubstance abuse and diabetes presented to the ED with acute onset headache chest pain and dizziness left-sided weakness and left facial weakness. -Patient reports that she was on the bus when she suddenly had worsening headache followed by left-sided weakness facial droop and slurring of speech. -got off the bus and was subsequently brought to the emergency room by EMS. 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 -psych consult appreciated    DM -continue levemir, SSI  CKD stage III -appears to be at baseline   H/o multiple strokes/PFO -MRI negative for new CVA -Continue aspirin and Plavix   Schizophrenia/bipolar disorder -Continue home regimen -follows with [facility]   Uncontrolled hypertension -add amlodipine   Body mass index is 18.82 kg/m.   Family Communication/Anticipated D/C date and plan/Code Status    Disposition Plan: social work consult   Medical Consultants:    Psych  neuro  Subjective:   Has been home less for 1-2 months that has been causing her stress  Objective:    Vitals:   11/24/17 0642 11/24/17 0827 11/24/17 1220 11/24/17 1624  BP: (!) 137/95  126/84 117/75  Pulse: 73  87 77  Resp: 14 16 20 18   Temp:      TempSrc:      SpO2: 100%  100% 100%  Weight:      Height:        Intake/Output Summary (Last 24 hours) at 11/24/2017 1805 Last data filed at 11/24/2017 1300 Gross per 24 hour  Intake 1120 ml  Output 1200 ml  Net -80 ml   Filed Weights   11/23/17 1206 11/23/17 1339 11/23/17 2043  Weight: 49.6 kg (109 lb 6.4 oz) 49.4 kg (109 lb) 48.2 kg (106 lb 4.2 oz)    Exam: In bed, forced sluring of speech  Data Reviewed:   I have personally reviewed following labs and imaging studies:  Labs: Labs show the following:   Basic Metabolic Panel: Recent Labs  Lab 11/23/17 1206 11/23/17 1305 11/23/17 2104 11/24/17 0410  NA 137 138  --  139  K 3.9 3.9  --  4.3  CL 104 101  --  108  CO2 22  --   --  24  GLUCOSE 92 86  --  119*  BUN 9 9  --  19  CREATININE 1.44* 1.50* 1.49* 1.54*  CALCIUM 9.4  --   --  9.0   GFR Estimated Creatinine Clearance: 38.1 mL/min (A) (by C-G formula based on SCr of 1.54 mg/dL (H)). Liver Function Tests: Recent Labs  Lab 11/23/17 1206 11/24/17 0410  AST 23 17  ALT 17 13  ALKPHOS 79 76  BILITOT 0.5 0.5  PROT 7.2 6.5  ALBUMIN 3.8 3.1*   No results for input(s): LIPASE, AMYLASE in the last 168 hours. No results for input(s): AMMONIA in the last 168 hours. Coagulation profile Recent Labs  Lab 11/23/17 1206  INR SPECIMEN CLOTTED    CBC: Recent Labs  Lab 11/23/17 1206 11/23/17 1305  11/23/17 2104 11/24/17 0410  WBC 4.9  --  7.5 5.4  NEUTROABS 2.7  --   --   --   HGB 10.9* 11.6* 11.8* 11.2*  HCT 35.9* 34.0* 38.7 36.3  MCV 83.7  --  83.0 82.9  PLT 203  --  208 200   Cardiac Enzymes: No results for input(s): CKTOTAL, CKMB, CKMBINDEX, TROPONINI in the last 168 hours. BNP (last 3 results) No results for input(s): PROBNP in the last 8760 hours. CBG: Recent Labs  Lab 11/23/17 1207 11/23/17 2125 11/24/17 0623 11/24/17 1139 11/24/17 1623  GLUCAP 93 136* 95 139* 146*   D-Dimer: No results for input(s): DDIMER in the last 72 hours. Hgb A1c: Recent Labs    11/23/17 2104  HGBA1C 5.6   Lipid Profile: No results for input(s): CHOL, HDL, LDLCALC, TRIG, CHOLHDL, LDLDIRECT in the last 72 hours. Thyroid function studies: No results for input(s): TSH, T4TOTAL, T3FREE, THYROIDAB in the last 72 hours.  Invalid input(s): FREET3 Anemia work up: No results for input(s): VITAMINB12, FOLATE, FERRITIN, TIBC, IRON, RETICCTPCT in the last 72 hours. Sepsis Labs: Recent Labs  Lab 11/23/17 1206 11/23/17 2104 11/24/17 0410  WBC 4.9 7.5 5.4    Microbiology No results found for this or any previous visit (from the past 240 hour(s)).  Procedures and diagnostic studies:  Ct Angio Head W Or Wo Contrast  Result Date: 11/23/2017 CLINICAL DATA:  Slurred speech and right-sided weakness. Prior stroke. EXAM: CT ANGIOGRAPHY HEAD AND NECK TECHNIQUE: Multidetector CT imaging of the head and neck was performed using the standard protocol during bolus administration of intravenous contrast. Multiplanar CT image reconstructions and MIPs were obtained to evaluate the vascular anatomy. Carotid stenosis measurements (when applicable) are obtained utilizing NASCET criteria, using the distal internal carotid diameter as the denominator. CONTRAST:  50mL ISOVUE-370 IOPAMIDOL (ISOVUE-370) INJECTION 76% COMPARISON:  Head MRA 09/25/2015. FINDINGS: CTA NECK FINDINGS Aortic arch: Normal variant aortic  arch branching pattern with common origin of the brachiocephalic and left common carotid arteries. Widely patent arch vessel origins. Right carotid system: Patent without evidence of stenosis, dissection, or significant atherosclerosis. Left carotid system: Patent without evidence of stenosis, dissection, or significant atherosclerosis. Vertebral arteries: Patent without evidence of stenosis, dissection, or significant atherosclerosis. Mildly dominant left vertebral artery. Skeleton: Mild cervical spondylosis. Other neck: No evidence of acute abnormality or mass. Upper chest: Clear lung apices. Review of the MIP images confirms the above findings CTA HEAD FINDINGS Anterior circulation: The internal carotid arteries are widely patent from skull base to carotid termini. ACAs and MCAs are patent without evidence of proximal branch occlusion or significant proximal stenosis. The right A1 segment is hypoplastic. No aneurysm is identified. Posterior circulation: The intracranial vertebral arteries are widely patent to the basilar. Patent bilateral PICA, right AICA, and left SCA origins are visualized.  The basilar artery is patent with mild narrowing distally. There is a small left posterior communicating artery. The PCAs are patent without evidence of significant stenosis. No aneurysm is identified. Venous sinuses: Patent. Anatomic variants: Hypoplastic right A1 segment. Review of the MIP images confirms the above findings IMPRESSION: 1. No emergent large vessel occlusion. 2. Patent circle of Willis without proximal branch occlusion or flow limiting proximal stenosis. 3. Mild distal basilar artery narrowing. 4. Widely patent cervical carotid and vertebral arteries. These results were communicated to Dr. Roda Shutters at 12:59 pm on 11/23/2017 by text page via the [facility] messaging system. Electronically Signed   By: Sebastian Ache M.D.   On: 11/23/2017 13:10   Ct Angio Neck W Or Wo Contrast  Result Date: 11/23/2017 CLINICAL DATA:   Slurred speech and right-sided weakness. Prior stroke. EXAM: CT ANGIOGRAPHY HEAD AND NECK TECHNIQUE: Multidetector CT imaging of the head and neck was performed using the standard protocol during bolus administration of intravenous contrast. Multiplanar CT image reconstructions and MIPs were obtained to evaluate the vascular anatomy. Carotid stenosis measurements (when applicable) are obtained utilizing NASCET criteria, using the distal internal carotid diameter as the denominator. CONTRAST:  50mL ISOVUE-370 IOPAMIDOL (ISOVUE-370) INJECTION 76% COMPARISON:  Head MRA 09/25/2015. FINDINGS: CTA NECK FINDINGS Aortic arch: Normal variant aortic arch branching pattern with common origin of the brachiocephalic and left common carotid arteries. Widely patent arch vessel origins. Right carotid system: Patent without evidence of stenosis, dissection, or significant atherosclerosis. Left carotid system: Patent without evidence of stenosis, dissection, or significant atherosclerosis. Vertebral arteries: Patent without evidence of stenosis, dissection, or significant atherosclerosis. Mildly dominant left vertebral artery. Skeleton: Mild cervical spondylosis. Other neck: No evidence of acute abnormality or mass. Upper chest: Clear lung apices. Review of the MIP images confirms the above findings CTA HEAD FINDINGS Anterior circulation: The internal carotid arteries are widely patent from skull base to carotid termini. ACAs and MCAs are patent without evidence of proximal branch occlusion or significant proximal stenosis. The right A1 segment is hypoplastic. No aneurysm is identified. Posterior circulation: The intracranial vertebral arteries are widely patent to the basilar. Patent bilateral PICA, right AICA, and left SCA origins are visualized. The basilar artery is patent with mild narrowing distally. There is a small left posterior communicating artery. The PCAs are patent without evidence of significant stenosis. No aneurysm  is identified. Venous sinuses: Patent. Anatomic variants: Hypoplastic right A1 segment. Review of the MIP images confirms the above findings IMPRESSION: 1. No emergent large vessel occlusion. 2. Patent circle of Willis without proximal branch occlusion or flow limiting proximal stenosis. 3. Mild distal basilar artery narrowing. 4. Widely patent cervical carotid and vertebral arteries. These results were communicated to Dr. Roda Shutters at 12:59 pm on 11/23/2017 by text page via the [facility] messaging system. Electronically Signed   By: Sebastian Ache M.D.   On: 11/23/2017 13:10   Mr Brain Wo Contrast  Result Date: 11/23/2017 CLINICAL DATA:  Sudden onset of left-sided weakness, slurred speech and facial droop. EXAM: MRI HEAD WITHOUT CONTRAST TECHNIQUE: Multiplanar, multiecho pulse sequences of the brain and surrounding structures were obtained without intravenous contrast. COMPARISON:  CT study same day.  MRI 11/05/2015. FINDINGS: Brain: No sign of acute infarction, mass lesion, hemorrhage, hydrocephalus or extra-axial collection. There is an old right frontal cortical infarction. Small lacunar infarct left caudate head is unchanged. No other abnormality. Vascular: Major vessels at the base of the brain show flow. Venous sinuses appear patent. Skull and upper cervical spine: Normal. Sinuses/Orbits:  Clear/normal. Other: None significant. IMPRESSION: No acute finding. No change since 2017. Old small right frontal cortical infarction. Old small lacunar infarct left caudate head. Electronically Signed   By: Paulina FusiMark  Shogry M.D.   On: 11/23/2017 13:36   Ct Head Code Stroke Wo Contrast  Result Date: 11/23/2017 CLINICAL DATA:  Code stroke. 37 year old female with weakness and slurred speech. Chronic right MCA territory encephalomalacia. EXAM: CT HEAD WITHOUT CONTRAST TECHNIQUE: Contiguous axial images were obtained from the base of the skull through the vertex without intravenous contrast. COMPARISON:  Brain MRI 11/05/2015, and  earlier. FINDINGS: Brain: Stable small area of chronic encephalomalacia in the right middle frontal gyrus anteriorly seen on series 3, image 21 today. Gray-white matter differentiation elsewhere remains normal. For stable underlying cerebral volume, with chronic volume loss in the posterior body of the corpus callosum. No midline shift, ventriculomegaly, mass effect, evidence of mass lesion, intracranial hemorrhage or evidence of cortically based acute infarction. Vascular: No suspicious intracranial vascular hyperdensity. Skull: Stable, negative. Sinuses/Orbits: Visualized paranasal sinuses and mastoids are stable and well pneumatized. Other: Visualized orbits and scalp soft tissues are within normal limits. ASPECTS [facility]([place] Stroke Program Early CT Score) - Ganglionic level infarction (caudate, lentiform nuclei, internal capsule, insula, M1-M3 cortex): 7 - Supraganglionic infarction (M4-M6 cortex): 3 Total score (0-10 with 10 being normal): 10 IMPRESSION: 1. No acute intracranial hemorrhage or cortically based infarct identified. 2. Stable appearance of the brain since 2017 with a small area of chronic encephalomalacia in the right middle frontal gyrus, and chronic or congenital volume loss in the posterior corpus callosum. ASPECTS is 10. 3. These results were communicated to Dr. Jerel ShepherdJ. Xu at 12:34 pmon 7/16/2019by text page via the [facility] messaging system. Electronically Signed   By: Odessa FlemingH  Hall M.D.   On: 11/23/2017 12:35    Medications:   . amLODipine  5 mg Oral Daily  . aspirin  325 mg Oral Daily  . atorvastatin  40 mg Oral q1800  . clopidogrel  75 mg Oral Daily  . enoxaparin (LOVENOX) injection  40 mg Subcutaneous Q24H  . famotidine  20 mg Oral BID  . gabapentin  600 mg Oral TID  . insulin aspart  0-9 Units Subcutaneous TID WC  . insulin detemir  10 Units Subcutaneous QHS  . QUEtiapine  300 mg Oral QHS  . risperiDONE  0.25 mg Oral BID  . trazodone  150 mg Oral QHS   Continuous Infusions:   LOS: 0  days   Joseph ArtJessica U Gurtaj Ruz  [facility]   *Please refer to [email or web address], password [email or web address] to get updated schedule on who will round on this patient, as hospitalists switch teams weekly. If 7PM-7AM, please contact night-coverage at [email or web address], password [email or web address] for any overnight needs.  11/24/2017, 6:05 PM

## 2017-11-24 NOTE — Progress Notes (Signed)
Staff attempted to discharge pt. Pt is refusing d/c stating that she would be receiving a wheelchair on d/c. PT recommended a walker at time of discharge; which pt did not have available. PT is stating that she is not able to walk. RN notified K Shorr by text page to cancel d/c since pt is refusing discharge; since she states she is unable to walk and does not have proper equipment. Charge nurse notified and social worker notified.

## 2017-11-24 NOTE — Consult Note (Signed)
[facility] Face-to-Face Psychiatry Consult   Reason for Consult:  Conversion disorder Referring Physician:  Dr. Joseph Patient Identification: Tanya Mayer MRN:  [id number] Principal Diagnosis: Situational anxiety Diagnosis:   Patient Active Problem List   Diagnosis Date Noted  . Right hemiparesis (HCC) [G81.91] 11/23/2017  . Left-sided weakness [R53.1]   . ICH (intracerebral hemorrhage) (HCC) [I61.9] 09/24/2015  . Anaphylaxis [T78.2XXA] 07/01/2015  . Presence of IVC filter [Z95.828]   . Essential hypertension, benign [I10]   . Pain in the chest [R07.9]   . Pain [R52]   . HLD (hyperlipidemia) [E78.5]   . CVA (cerebral infarction) [I63.9] 03/19/2015  . Chest pain [R07.9]   . Cerebral infarction due to unspecified mechanism [I63.9]   . UTI (lower urinary tract infection) [N39.0]   . 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 Type 2 diabetes mellitus with diabetic neuropathy, with long-term current use of insulin (HCC) [E11.40, Z79.4]   . Acute thromboembolic cerebrovascular accident (CVA) (HCC) [I63.9]   . PFO (patent foramen ovale) [Q21.1]   . 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 Schizophrenia (HCC) [F20.9]   . Bipolar I disorder (HCC) [F31.9]   . Seizure disorder (HCC) [G40.909]   . Acute encephalopathy [G93.40] 02/27/2015  . Stroke (HCC) [I63.9] 01/03/2015  . Essential hypertension [I10] 01/03/2015  . DM (diabetes mellitus) type II controlled, neurological manifestation (HCC) [E11.49] 01/03/2015  . Hyperlipidemia [E78.5] 01/03/2015  . Hemiparesis affecting left side as late effect of cerebrovascular accident (HCC) [I69.354] 01/03/2015  . Tobacco abuse [Z72.0] 01/03/2015  . Marijuana abuse [F12.10] 01/03/2015  . Asthma [J45.909] 01/03/2015  . History of DVT of lower extremity [Z86.718] 01/03/2015    Total Time spent with patient: 1 hour  Subjective:   Tanya Mayer is a 36 y.o. female patient admitted with right  hemiparesis and right facial droop.  HPI:   Per chart review, patient was admitted with right hemiparesis and right facial droop. She experienced worsening headache followed by left sided weakness with facial droop and slurred speech while on a bus. She has a history of multiple strokes in setting of PFO. Primary team suspects worsening of prior stroke deficits in the setting of cocaine use and medication noncompliance with antiplatelet agents versus conversion disorder. Brain MRI with no acute findings on admission. She was evaluated by PT. Her presentation was noted to be inconsistent due to bearing full weight and forward progress of her left lower extremity during gait without buckling or loss of balance although she is unable to lift her left lower extremity against gravity while supine. She is recommended for 24/7 supervision at home. She has a history of schizophrenia, bipolar disorder and conversion disorder. Home medications include Risperdal 0.25 mg BID, Trazodone 300 mg qhs, Seroquel 300 mg qhs, Atarax 50 mg qhs, Gabapentin 900 mg TID and Xanax 1 mg BID PRN.   On interview, Tanya Mayer is difficult to understand at times due to slurred speech.  She reports that her weakness and speech have worsened since admission.  She appears to be more clear at times when speaking as though she volitionally slurs her speech.  She reports that she was riding the bus to house hunt prior to admission.  She reports that she has been homeless for the past 2 months.  She reports noncompliance with her medications due to not being able to afford them.  She has not taking Keppra or her psychiatric medications for several months.  She reports having   a seizure prior to admission.  She smokes marijuana for migraines and seizures.  She also reports cocaine use.  She reports her last use was over a week ago.  She reports that her mood has been okay although current stressors include homelessness.  She denies SI, HI or AVH.  She  denies problems with appetite.  She enjoys cooking, writing and poetry.  She reports poor sleep due to problems initiating sleep.  Past Psychiatric History: Polysubstance abuse (cocaine and marijuana use), bipolar disorder and schizophrenia.   Risk to Self:  None. Denies SI.   Risk to Others:  None. Denies HI.  Prior Inpatient Therapy:  She was hospitalized last year for hallucinations. She reports a history of multiple hospitalizations.  Prior Outpatient Therapy:  Denies   Past Medical History:  Past Medical History:  Diagnosis Date  . Anemia   . Anginal pain (HCC)   . Bipolar disorder (HCC)   . Bronchial asthma   . Chronic lower back pain   . DM (diabetes mellitus) type II uncontrolled, periph vascular disorder (HCC)    notes 01/03/2015  . DVT (deep venous thrombosis) (HCC) after 2008   "BLE"  . GERD (gastroesophageal reflux disease)   . Heart murmur    "born w/one"  . Hyperlipemia   . Hypertension   . Migraine    "weekly" (03/19/2015)  . PFO (patent foramen ovale)    large, per TEE 03/01/2015  . Schizophrenia (HCC)   . 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 Stroke (HCC) 04/2014; 09/2014(/notes 04/25/2015);  12/2014; 01/2015; 02/2015   "left side weakness; speech problems since" (03/19/2015)  . Stroke (HCC) 03/19/2015   "left side weakness; speech problems" (03/19/2015)    Past Surgical History:  Procedure Laterality Date  . CESAREAN SECTION  2008  . HERNIA REPAIR    . TEE WITHOUT CARDIOVERSION N/A 03/01/2015   Procedure: TRANSESOPHAGEAL ECHOCARDIOGRAM (TEE);  Surgeon: Mihai Croitoru, MD;  Location: [facility];  Service: Cardiovascular;  Laterality: N/A;  . TUBAL LIGATION  2008  . UMBILICAL HERNIA REPAIR  "@ birth"  . VSD REPAIR      as a baby   Family History:  Family History  Problem Relation Age of Onset  . Hypertension Mother   . Clotting disorder Mother        hypercoagulable disorder on mother side  . Heart attack Mother    Family Psychiatric   History: Maternal grandmother-bipolar disorder and schizophrenia.   Social History:  Social History   Substance and Sexual Activity  Alcohol Use Yes  . Alcohol/week: 0.0 oz   Comment: 03/19/2015 "a couple times/month"     Social History   Substance and Sexual Activity  Drug Use Yes  . Types: Marijuana   Comment: 03/19/2015 "a couple times/month"    Social History   Socioeconomic History  . Marital status: Married    Spouse name: Not on file  . Number of children: Not on file  . Years of education: Not on file  . Highest education level: Not on file  Occupational History  . Not on file  Social Needs  . Financial resource strain: Not on file  . Food insecurity:    Worry: Not on file    Inability: Not on file  . Transportation needs:    Medical: Not on file    Non-medical: Not on file  Tobacco Use  . Smoking status: Current Every Day Smoker    Packs/day: 0.33    Years: 21.00      Pack years: 6.93    Types: Cigarettes  . Smokeless tobacco: Never Used  Substance and Sexual Activity  . Alcohol use: Yes    Alcohol/week: 0.0 oz    Comment: 03/19/2015 "a couple times/month"  . Drug use: Yes    Types: Marijuana    Comment: 03/19/2015 "a couple times/month"  . Sexual activity: Yes  Lifestyle  . Physical activity:    Days per week: Not on file    Minutes per session: Not on file  . Stress: Not on file  Relationships  . Social connections:    Talks on phone: Not on file    Gets together: Not on file    Attends religious service: Not on file    Active member of club or organization: Not on file    Attends meetings of clubs or organizations: Not on file    Relationship status: Not on file  Other Topics Concern  . Not on file  Social History Narrative  . Not on file   Additional Social History: She is homeless. She is married. She receives disability. She reports regular marijuana use and episodic cocaine use. She reports last using cocaine over a week ago. She denies  alcohol use.     Allergies:   Allergies  Allergen Reactions  . Daucus [place]ota Anaphylaxis and Itching  . Eggs Or Egg-Derived Products Anaphylaxis, Itching and Swelling  . Mushroom Extract Complex Anaphylaxis and Itching    Labs:  Results for orders placed or performed during the hospital encounter of 11/23/17 (from the past 48 hour(s))  Protime-INR     Status: None   Collection Time: 11/23/17 12:06 PM  Result Value Ref Range   Prothrombin Time SPECIMEN CLOTTED 11.4 - 15.2 seconds    Comment: K.MORRIS,RN 11/23/17 1302 DAVISB CORRECTED ON 07/16 AT 1302: PREVIOUSLY REPORTED AS 13.5    INR SPECIMEN CLOTTED     Comment: K.MORRIS,RN 11/23/17 1302 DAVISB Performed at [facility] [address], [place], [place] [address] CORRECTED ON 07/16 AT 1302: PREVIOUSLY REPORTED AS 1.04   APTT     Status: None   Collection Time: 11/23/17 12:06 PM  Result Value Ref Range   aPTT SPECIMEN CLOTTED 24 - 36 seconds    Comment: K.MORRIS,RN 11/23/17 1302 DAVISB Performed at [facility] [address], [place] [address] CORRECTED ON 07/16 AT 1302: PREVIOUSLY REPORTED AS 20   CBC     Status: Abnormal   Collection Time: 11/23/17 12:06 PM  Result Value Ref Range   WBC 4.9 4.0 - 10.5 K/uL    Comment: WHITE COUNT CONFIRMED ON SMEAR   RBC 4.29 3.87 - 5.11 MIL/uL   Hemoglobin 10.9 (L) 12.0 - 15.0 g/dL   HCT 35.9 (L) 36.0 - 46.0 %   MCV 83.7 78.0 - 100.0 fL   MCH 25.4 (L) 26.0 - 34.0 pg   MCHC 30.4 30.0 - 36.0 g/dL   RDW 15.9 (H) 11.5 - 15.5 %   Platelets 203 150 - 400 K/uL    Comment: PLATELET COUNT CONFIRMED BY SMEAR Performed at [facility], [place] [address], [place],  [address]   Differential     Status: None   Collection Time: 11/23/17 12:06 PM  Result Value Ref Range   Neutrophils Relative % 57 %   Lymphocytes Relative 32 %   Monocytes Relative 9 %   Eosinophils Relative 1 %   Basophils Relative 1 %   Neutro Abs 2.7 1.7 - 7.7  K/uL   Lymphs Abs 1.6 0.7 -  4.0 K/uL   Monocytes Absolute 0.4 0.1 - 1.0 K/uL   Eosinophils Absolute 0.1 0.0 - 0.7 K/uL   Basophils Absolute 0.1 0.0 - 0.1 K/uL   Smear Review MORPHOLOGY UNREMARKABLE     Comment: Performed at [facility] [address] [place], [place] [address]  Comprehensive metabolic panel     Status: Abnormal   Collection Time: 11/23/17 12:06 PM  Result Value Ref Range   Sodium 137 135 - 145 mmol/L   Potassium 3.9 3.5 - 5.1 mmol/L   Chloride 104 98 - 111 mmol/L    Comment: Please note change in reference range.   CO2 22 22 - 32 mmol/L   Glucose, Bld 92 70 - 99 mg/dL    Comment: Please note change in reference range.   BUN 9 6 - 20 mg/dL    Comment: Please note change in reference range.   Creatinine, Ser 1.44 (H) 0.44 - 1.00 mg/dL   Calcium 9.4 8.9 - 10.3 mg/dL   Total Protein 7.2 6.5 - 8.1 g/dL   Albumin 3.8 3.5 - 5.0 g/dL   AST 23 15 - 41 U/L   ALT 17 0 - 44 U/L    Comment: Please note change in reference range.   Alkaline Phosphatase 79 38 - 126 U/L   Total Bilirubin 0.5 0.3 - 1.2 mg/dL   GFR calc non Af Amer 46 (L) >60 mL/min   GFR calc Af Amer 53 (L) >60 mL/min    Comment: (NOTE) The eGFR has been calculated using the CKD EPI equation. This calculation has not been validated in all clinical situations. eGFR's persistently <60 mL/min signify possible Chronic Kidney Disease.    Anion gap 11 5 - 15    Comment: Performed at [facility] [address] [place], [place] [address]  CBG monitoring, ED     Status: None   Collection Time: 11/23/17 12:07 PM  Result Value Ref Range   Glucose-Capillary 93 70 - 99 mg/dL   Comment 1 Notify RN    Comment 2 Document in Chart   I-stat troponin, ED     Status: None   Collection Time: 11/23/17  1:03 PM  Result Value Ref Range   Troponin i, poc 0.00 0.00 - 0.08 ng/mL   Comment 3            Comment: Due to the release kinetics of cTnI, a negative result within the first hours of the onset of symptoms does not rule  out myocardial infarction with certainty. If myocardial infarction is still suspected, repeat the test at appropriate intervals.   I-Stat beta hCG blood, ED     Status: None   Collection Time: 11/23/17  1:03 PM  Result Value Ref Range   I-stat hCG, quantitative <5.0 <5 mIU/mL   Comment 3            Comment:   GEST. AGE      CONC.  (mIU/mL)   <=1 WEEK        5 - 50     2 WEEKS       50 - 500     3 WEEKS       100 - 10,000     4 WEEKS     1,000 - 30,000        FEMALE AND NON-PREGNANT FEMALE:     LESS THAN 5 mIU/mL   I-Stat Chem 8,  ED     Status: Abnormal   Collection Time: 11/23/17  1:05 PM  Result Value Ref Range   Sodium 138 135 - 145 mmol/L   Potassium 3.9 3.5 - 5.1 mmol/L   Chloride 101 98 - 111 mmol/L   BUN 9 6 - 20 mg/dL   Creatinine, Ser 1.50 (H) 0.44 - 1.00 mg/dL   Glucose, Bld 86 70 - 99 mg/dL   Calcium, Ion 1.16 1.15 - 1.40 mmol/L   TCO2 27 22 - 32 mmol/L   Hemoglobin 11.6 (L) 12.0 - 15.0 g/dL   HCT 34.0 (L) 36.0 - 46.0 %  CBC     Status: Abnormal   Collection Time: 11/23/17  9:04 PM  Result Value Ref Range   WBC 7.5 4.0 - 10.5 K/uL   RBC 4.66 3.87 - 5.11 MIL/uL   Hemoglobin 11.8 (L) 12.0 - 15.0 g/dL   HCT 38.7 36.0 - 46.0 %   MCV 83.0 78.0 - 100.0 fL   MCH 25.3 (L) 26.0 - 34.0 pg   MCHC 30.5 30.0 - 36.0 g/dL   RDW 15.8 (H) 11.5 - 15.5 %   Platelets 208 150 - 400 K/uL    Comment: Performed at [facility], [address] [place], [place] [address]  Creatinine, serum     Status: Abnormal   Collection Time: 11/23/17  9:04 PM  Result Value Ref Range   Creatinine, Ser 1.49 (H) 0.44 - 1.00 mg/dL   GFR calc non Af Amer 44 (L) >60 mL/min   GFR calc Af Amer 51 (L) >60 mL/min    Comment: (NOTE) The eGFR has been calculated using the CKD EPI equation. This calculation has not been validated in all clinical situations. eGFR's persistently <60 mL/min signify possible Chronic Kidney Disease. Performed at [facility], [address] [place],  [place] [address]   Hemoglobin A1c     Status: None   Collection Time: 11/23/17  9:04 PM  Result Value Ref Range   Hgb A1c MFr Bld 5.6 4.8 - 5.6 %    Comment: (NOTE) Pre diabetes:          5.7%-6.4% Diabetes:              >6.4% Glycemic control for   <7.0% adults with diabetes    Mean Plasma Glucose 114.02 mg/dL    Comment: Performed at [facility], [address] [place], [place] [address]  Glucose, capillary     Status: Abnormal   Collection Time: 11/23/17  9:25 PM  Result Value Ref Range   Glucose-Capillary 136 (H) 70 - 99 mg/dL  CBC     Status: Abnormal   Collection Time: 11/24/17  4:10 AM  Result Value Ref Range   WBC 5.4 4.0 - 10.5 K/uL   RBC 4.38 3.87 - 5.11 MIL/uL   Hemoglobin 11.2 (L) 12.0 - 15.0 g/dL   HCT 36.3 36.0 - 46.0 %   MCV 82.9 78.0 - 100.0 fL   MCH 25.6 (L) 26.0 - 34.0 pg   MCHC 30.9 30.0 - 36.0 g/dL   RDW 15.9 (H) 11.5 - 15.5 %   Platelets 200 150 - 400 K/uL    Comment: Performed at [facility], [address] [place], [place] [address]  Comprehensive metabolic panel     Status: Abnormal   Collection Time: 11/24/17  4:10 AM  Result Value Ref Range   Sodium 139 135 - 145 mmol/L   Potassium 4.3 3.5 -   5.1 mmol/L   Chloride 108 98 - 111 mmol/L    Comment: Please note change in reference range.   CO2 24 22 - 32 mmol/L   Glucose, Bld 119 (H) 70 - 99 mg/dL    Comment: Please note change in reference range.   BUN 19 6 - 20 mg/dL    Comment: Please note change in reference range.   Creatinine, Ser 1.54 (H) 0.44 - 1.00 mg/dL   Calcium 9.0 8.9 - 10.3 mg/dL   Total Protein 6.5 6.5 - 8.1 g/dL   Albumin 3.1 (L) 3.5 - 5.0 g/dL   AST 17 15 - 41 U/L   ALT 13 0 - 44 U/L    Comment: Please note change in reference range.   Alkaline Phosphatase 76 38 - 126 U/L   Total Bilirubin 0.5 0.3 - 1.2 mg/dL   GFR calc non Af Amer 42 (L) >60 mL/min   GFR calc Af Amer 49 (L) >60 mL/min    Comment: (NOTE) The eGFR has been calculated using the CKD EPI equation. This  calculation has not been validated in all clinical situations. eGFR's persistently <60 mL/min signify possible Chronic Kidney Disease.    Anion gap 7 5 - 15    Comment: Performed at [facility] [address], [place], [place] [address]  Glucose, capillary     Status: None   Collection Time: 11/24/17  6:23 AM  Result Value Ref Range   Glucose-Capillary 95 70 - 99 mg/dL   Comment 1 Notify RN    Comment 2 Document in Chart   Glucose, capillary     Status: Abnormal   Collection Time: 11/24/17 11:39 AM  Result Value Ref Range   Glucose-Capillary 139 (H) 70 - 99 mg/dL   Comment 1 QC Due     Current Facility-Administered Medications  Medication Dose Route Frequency Provider Last Rate Last Dose  . acetaminophen (TYLENOL) tablet 650 mg  650 mg Oral Q6H PRN Domenic Polite, MD       Or  . acetaminophen (TYLENOL) suppository 650 mg  650 mg Rectal Q6H PRN Domenic Polite, MD      . albuterol (PROVENTIL) (2.5 MG/3ML) 0.083% nebulizer solution 3 mL  3 mL Inhalation Q6H PRN Domenic Polite, MD      . amLODipine (NORVASC) tablet 5 mg  5 mg Oral Daily Domenic Polite, MD   5 mg at 11/24/17 1042  . aspirin EC tablet 325 mg  325 mg Oral Daily Domenic Polite, MD   325 mg at 11/24/17 1042  . atorvastatin (LIPITOR) tablet 40 mg  40 mg Oral q1800 Domenic Polite, MD      . clopidogrel (PLAVIX) tablet 75 mg  75 mg Oral Daily Domenic Polite, MD   75 mg at 11/24/17 1043  . enoxaparin (LOVENOX) injection 40 mg  40 mg Subcutaneous Q24H Vann, Jessica U, DO      . famotidine (PEPCID) tablet 20 mg  20 mg Oral BID Domenic Polite, MD   20 mg at 11/24/17 1043  . gabapentin (NEURONTIN) capsule 600 mg  600 mg Oral TID Domenic Polite, MD   600 mg at 11/24/17 1042  . insulin aspart (novoLOG) injection 0-9 Units  0-9 Units Subcutaneous TID WC Domenic Polite, MD   1 Units at 11/24/17 1204  . insulin detemir (LEVEMIR) injection 10 Units  10 Units Subcutaneous QHS Domenic Polite, MD   10 Units at 11/23/17 2232   . ondansetron (ZOFRAN) tablet 4 mg  4 mg  Oral Q6H PRN Domenic Polite, MD       Or  . ondansetron [facility]) injection 4 mg  4 mg Intravenous Q6H PRN Domenic Polite, MD      . QUEtiapine (SEROQUEL) tablet 300 mg  300 mg Oral QHS Domenic Polite, MD   Stopped at 11/23/17 2237  . risperiDONE (RISPERDAL) tablet 0.25 mg  0.25 mg Oral BID Domenic Polite, MD   0.25 mg at 11/24/17 1043  . traZODone (DESYREL) tablet 150 mg  150 mg Oral QHS Domenic Polite, MD   150 mg at 11/23/17 2231    Musculoskeletal: Strength & Muscle Tone: decreased with left sided weakness.  Gait & Station: unable to stand Patient leans: N/A  Psychiatric Specialty Exam: Physical Exam  Nursing note and vitals reviewed. Constitutional: She is oriented to person, place, and time. She appears well-developed and well-nourished.  HENT:  Head: Normocephalic and atraumatic.  Neck: Normal range of motion.  Respiratory: Effort normal.  Musculoskeletal: Normal range of motion.  Neurological: She is alert and oriented to person, place, and time.  Skin: No rash noted.  Psychiatric: She has a normal mood and affect. Her speech is normal and behavior is normal. Judgment and thought content normal. Cognition and memory are normal.    Review of Systems  Constitutional: Negative for chills and fever.  Cardiovascular: Negative for chest pain.  Gastrointestinal: Negative for abdominal pain, constipation, diarrhea, nausea and vomiting.  Psychiatric/Behavioral: Positive for substance abuse. Negative for depression, hallucinations and suicidal ideas. The patient is nervous/anxious and has insomnia.   All other systems reviewed and are negative.   Blood pressure 126/84, pulse 87, temperature 97.8 F (36.6 C), temperature source Oral, resp. rate 20, height 5' 3" (1.6 m), weight 48.2 kg (106 lb 4.2 oz), SpO2 100 %.Body mass index is 18.82 kg/m.  General Appearance: Fairly Groomed, young, African American female, wearing a hospital gown and  lying in bed with her eyes closed. NAD.   Eye Contact:  Poor since eyes closed.  Speech:  Slurred  Volume:  Decreased  Mood:  "Okay"  Affect:  Constricted  Thought Process:  Goal Directed, Linear and Descriptions of Associations: Intact  Orientation:  Full (Time, Place, and Person)  Thought Content:  Logical  Suicidal Thoughts:  No  Homicidal Thoughts:  No  Memory:  Immediate;   Good Recent;   Good Remote;   Good  Judgement:  Fair  Insight:  Fair  Psychomotor Activity:  Decreased  Concentration:  Concentration: Good and Attention Span: Good  Recall:  Good  Fund of Knowledge:  Good  Language:  Good  Akathisia:  No  Handed:  Right  AIMS (if indicated):   N/A  Assets:  Communication Skills Desire for Improvement Financial Resources/Insurance Housing Intimacy Social Support  ADL's:  Intact  Cognition:  WNL  Sleep:   Poor   Assessment:  Tanya Mayer is a 37 y.o. female who was admitted with right hemiparesis and right facial droop with concern for worsening of prior stroke deficits in setting of cocaine use and medication noncompliance with antiplatelet agents versus functional neurological symptom disorder. She endorses recent stressors including homelessness for 2 months. She denies SI, HI or AVH. It is possible that her symptoms are secondary to functional neurological symptom disorder given inconsistent exam with PT in setting of recent stressors. Recommend continuing medication management for mood and anxiety and follow up with outpatient psychiatrist to develop healthy coping skills to manage stressors.   Treatment Plan Summary: -Continue Seroquel 300  mg qhs for mood stabilization, Trazodone 150 mg qhs for insomnia, Risperdal 0.25 mg BID for psychosis and Gabapentin 600 mg TID for pain/anxiety.  -Would not restart Xanax since not indicated for chronic treatment of anxiety and in setting of substance abuse. PMP also does not have record of prescriptions filled for Xanax.  There is one short supply prescription for Norco in 03/2016.  -Please have SW provide patient with resources for psychiatrists. Patient may qualify for sliding scale services based on income at [facility]. She may also benefit from establishing care with a therapist to develop healthy coping skills to manage stressors. May also be beneficial to provide patient with substance abuse resources.  -Psychiatry will sign off on patient at this time. Please consult psychiatry again as needed.   Disposition: No evidence of imminent risk to self or others at present.   Patient does not meet criteria for psychiatric inpatient admission.   J , DO 11/24/2017 2:18 PM 
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
## 2017-11-24 NOTE — Progress Notes (Signed)
Nurse reports that patient has been seen by Child psychotherapistsocial worker and given resources, nothing more to offer per social work. Marlin CanaryJessica Rionna Feltes DO

## 2017-11-24 NOTE — Evaluation (Signed)
Physical Therapy Evaluation Patient Details Name: Tanya Mayer MRN: [id number] DOB: 05/18/1980 Today's Date: 11/24/2017   History of Present Illness  Patient is a 37 y/o female presenting with acute onset of headache, chest pain, dizziness, and L sided weakness. Patient admits noncompliance with medications and using marijuana and cocaine yesterday. No stroke noted on MRI or CTA/CTP, concern for conversion disorder (per chart review). Patient with a PMH significant for schizophrenia, bipolar disorder, conversion disorders, multiple prior strokes, large PFO, tobacco abuse, polysubstance abuse and diabetes.    Clinical Impression  Ms. Heuer admitted with the above listed diagnosis. Patient independent with mobility prior to admission. Patient today reporting inability to functionally use L LE/UE, however performing transfers and gait with Min guard for general safety. Inconsistent presentation with patient unable to lift L LE against gravity while supine, but able to bear full weight and forward progress L LE during gait without buckling or LOB. Education with patient regarding call nursing staff for restroom use to promote increased mobility. PT to continue to follow acutely to maximize mobility prior to d/c.     Follow Up Recommendations Supervision/Assistance - 24 hour;Outpatient PT    Equipment Recommendations  Rolling walker with 5" wheels    Recommendations for Other Services       Precautions / Restrictions Precautions Precautions: Fall Restrictions Weight Bearing Restrictions: No      Mobility  Bed Mobility Overal bed mobility: Needs Assistance Bed Mobility: Supine to Sit     Supine to sit: Min assist     General bed mobility comments: for LE management towards EOB; able to sit EOB x 5 min without LOB; able to scoot bedside for repositioning independently  Transfers Overall transfer level: Needs assistance Equipment used: Rolling walker (2 wheeled) Transfers: Sit  to/from Stand Sit to Stand: Min guard         General transfer comment: min guard from bedside (lowest position), pulls up from RW but wihtout LOB  Ambulation/Gait Ambulation/Gait assistance: Min guard Gait Distance (Feet): 70 Feet Assistive device: Rolling walker (2 wheeled) Gait Pattern/deviations: Step-to pattern;Decreased stride length;Narrow base of support Gait velocity: decreased   General Gait Details: drags L LE across floor, reports she can not lift LE but does not require physical assist for forward progression of LE. no instances of L knee buckling with gait with full weight bearing on L LE  Stairs            Wheelchair Mobility    Modified Rankin (Stroke Patients Only)       Balance Overall balance assessment: Mild deficits observed, not formally tested                                           Pertinent Vitals/Pain Pain Assessment: 0-10 Pain Score: 10-Worst pain ever Pain Location: headache Pain Intervention(s): Limited activity within patient's tolerance;Monitored during session    Home Living                   Additional Comments: patient and spouse reporting homelessness    Prior Function Level of Independence: Independent               Hand Dominance        Extremity/Trunk Assessment   Upper Extremity Assessment Upper Extremity Assessment: Defer to OT evaluation    Lower Extremity Assessment Lower Extremity Assessment: RLE deficits/detail;LLE deficits/detail RLE Deficits /  Details: grossly 4-5/5 LLE Deficits / Details: in bed unable to lift against gravity, but able to bear full weight with knee buckling and able to forwardly progress L LE without physical assist during gait    Cervical / Trunk Assessment Cervical / Trunk Assessment: Normal  Communication   Communication: No difficulties  Cognition Arousal/Alertness: Awake/alert Behavior During Therapy: WFL for tasks assessed/performed Overall  Cognitive Status: Within Functional Limits for tasks assessed                                        General Comments      Exercises     Assessment/Plan    PT Assessment Patient needs continued PT services  PT Problem List Decreased strength;Decreased activity tolerance;Decreased mobility;Decreased balance;Decreased knowledge of use of DME;Decreased safety awareness       PT Treatment Interventions DME instruction;Gait training;Stair training;Functional mobility training;Therapeutic activities;Therapeutic exercise;Balance training;Neuromuscular re-education;Patient/family education    PT Goals (Current goals can be found in the Care Plan section)  Acute Rehab PT Goals Patient Stated Goal: none stated PT Goal Formulation: With patient Time For Goal Achievement: 12/08/17 Potential to Achieve Goals: Fair    Frequency Min 3X/week   Barriers to discharge        Co-evaluation               AM-PAC PT "6 Clicks" Daily Activity  Outcome Measure Difficulty turning over in bed (including adjusting bedclothes, sheets and blankets)?: A Little Difficulty moving from lying on back to sitting on the side of the bed? : A Little Difficulty sitting down on and standing up from a chair with arms (e.g., wheelchair, bedside commode, etc,.)?: Unable Help needed moving to and from a bed to chair (including a wheelchair)?: A Little Help needed walking in hospital room?: A Little Help needed climbing 3-5 steps with a railing? : A Lot 6 Click Score: 15    End of Session Equipment Utilized During Treatment: Gait belt Activity Tolerance: Patient tolerated treatment well Patient left: in chair;with call bell/phone within reach;with family/visitor present Nurse Communication: Mobility status PT Visit Diagnosis: Unsteadiness on feet (R26.81);Other abnormalities of gait and mobility (R26.89);Muscle weakness (generalized) (M62.81)    Time: [id number] PT Time Calculation (min)  (ACUTE ONLY): 34 min   Charges:   PT Evaluation $PT Eval Moderate Complexity: 1 Mod PT Treatments $Gait Training: 8-22 mins   PT G Codes:        Kipp LaurenceStephanie R Kristee Angus, PT, DPT 11/24/17 10:32 AM Pager: [phone]

## 2017-11-24 NOTE — Clinical Social Work Note (Addendum)
Clinical Social Work Assessment  Patient Details  Name: Tanya Mayer MRN: [id number] Date of Birth: Feb 10, 1981  Date of referral:  11/24/17               Reason for consult:  Housing Concerns/Homelessness                Permission sought to share information with:  Case Manager, Family Supports, Other Permission granted to share information::  Yes, Verbal Permission Granted  Name::        Agency::  IRC: Administrator, sports  Relationship::     Contact Information:     Housing/Transportation Living arrangements for the past 2 months:  Homeless(Recently moved from [place] ) Source of Information:  Patient, Spouse Patient Interpreter Needed:  None Criminal Activity/Legal Involvement Pertinent to Current Situation/Hospitalization:    Significant Relationships:  Warehouse manager, Spouse Lives with:  Spouse Do you feel safe going back to the place where you live?  Yes Need for family participation in patient care:  Yes (Comment)  Care giving concerns: CSW consulted due to homelessness.   Social Worker assessment / plan:  ED CSW received consult. ED CSW met with pt at pt's bedside. Pt informed CSW that she and her husband recently moved from [place], [place]. Pt stated that she and her husband have been staying outside. Pt has been actively going to the [facility]. Pt has been assigned a PATH case worker with the [facility], [name]. Pt stated she sees [place] the nurse at the [facility]. Pt stated that she and her husband have no where to go tonight. CSW explained that all the shelters are full. CSW called all the local and surrounding county shelters. Pt stated that she does can only go to a coed shelter in [place] because she does not want to separate from her husband. Pt stated she does not have any family members or friends in the area to help pay for a hotel room or where they can stay. CSW explained that pt is medically cleared and will be discharged from the hospital. CSW reiterated multiple times  that pt is up for discharged. CSW provided pt with resources and steps pt and CSW will take in reaching out to community resources that can follow up  With her at the [facility].   CSW will follow up with [name] at the [facility].  CSW will contact rapid rehousing to see what services may be available to pt. CSW reiterated that according to medical team, pt is cleared for discharge and outside community housing agencies can follow up with her through the [facility].   Pt questioned CSW about obtaining a wheelchair. CSW explained to pt that medical equipment is handled by another department and PT did not state pt needs a wheelchair and it is unlikely that insurance will cover it.   CSW will reach out to [facility] Coordinator, [name] about pt. Pt approved this.   Employment status:  Disabled (Comment on whether or not currently receiving Disability)(Currently receiving disability) Insurance information:  Medicaid In [place] PT Recommendations:  Outpatient Therapies Information / Referral to community resources:  Shelter, Other (Comment Required)([employer])  Patient/Family's Response to care:  CSW repeatedly explained what CSW can do tonight for pt. CSW explained that pt cannot be given housing tonight through the hospital.   Patient/Family's Understanding of and Emotional Response to Diagnosis, Current Treatment, and Prognosis:  Pt agreeable to following up at the [facility] for housing resources. Pt did not have any questions  for CSW about diagnosis or current treatment. Pt did have questions about wheelchair. No PT or CM notes in chart stating pt qualifies for wheelchair. CSW explained that to pt.   Emotional Assessment Appearance:  Appears stated age Attitude/Demeanor/Rapport:    Affect (typically observed):  Accepting, Appropriate, Calm Orientation:  Oriented to Self, Oriented to Place, Oriented to  Time, Oriented to Situation Alcohol / Substance use:    Psych involvement (Current and /or in  the community):  No (Comment)  Discharge Needs  Concerns to be addressed:  Homelessness Readmission within the last 30 days:  No Current discharge risk:  Homeless Barriers to Discharge:  Homeless with medical needs   [name], LCSW 11/24/2017, 6:36 PM

## 2017-11-25 LAB — GLUCOSE, CAPILLARY: Glucose-Capillary: 104 mg/dL — ABNORMAL HIGH (ref 70–99)

## 2017-11-25 MED ORDER — QUETIAPINE FUMARATE 300 MG PO TABS: 300.0000 mg | ORAL_TABLET | Freq: Every day | ORAL | 0 refills | Status: DC

## 2017-11-25 MED ORDER — ALBUTEROL SULFATE HFA 108 (90 BASE) MCG/ACT IN AERS: 2.0000 | INHALATION_SPRAY | Freq: Four times a day (QID) | RESPIRATORY_TRACT | 0 refills | Status: DC | PRN

## 2017-11-25 MED ORDER — CLOPIDOGREL BISULFATE 75 MG PO TABS: 75.0000 mg | ORAL_TABLET | Freq: Every day | ORAL | 0 refills | Status: DC

## 2017-11-25 MED ORDER — FAMOTIDINE 20 MG PO TABS: 20.0000 mg | ORAL_TABLET | Freq: Two times a day (BID) | ORAL | 0 refills | Status: DC

## 2017-11-25 MED ORDER — TRAZODONE HCL 150 MG PO TABS: 150.0000 mg | ORAL_TABLET | Freq: Every day | ORAL | 0 refills | Status: DC

## 2017-11-25 MED ORDER — ATORVASTATIN CALCIUM 40 MG PO TABS: 40.0000 mg | ORAL_TABLET | Freq: Every day | ORAL | 0 refills | Status: DC

## 2017-11-25 MED ORDER — GABAPENTIN 300 MG PO CAPS: 900.0000 mg | ORAL_CAPSULE | Freq: Three times a day (TID) | ORAL | 0 refills | Status: DC

## 2017-11-25 MED ORDER — RISPERIDONE 0.25 MG PO TABS: 0.2500 mg | ORAL_TABLET | Freq: Two times a day (BID) | ORAL | 0 refills | Status: DC

## 2017-11-25 MED ORDER — INSULIN DETEMIR 100 UNIT/ML ~~LOC~~ SOLN: 10.0000 [IU] | Freq: Every day | SUBCUTANEOUS | 5 refills | Status: DC

## 2017-11-25 NOTE — Congregational Nurse Program (Signed)
Congregational Nurse Program Note  Date of Encounter: 11/15/2017  Past Medical History: Past Medical History:  Diagnosis Date  . Anemia   . Anginal pain (HCC)   . Bipolar disorder (HCC)   . Bronchial asthma   . Chronic lower back pain   . DM (diabetes mellitus) type II uncontrolled, periph vascular disorder (HCC)    notes 01/03/2015  . DVT (deep venous thrombosis) (HCC) after 2008   "BLE"  . GERD (gastroesophageal reflux disease)   . Heart murmur    "born w/one"  . Hyperlipemia   . Hypertension   . Migraine    "weekly" (03/19/2015)  . PFO (patent foramen ovale)    large, per TEE 03/01/2015  . Schizophrenia (HCC)   . 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 Stroke (HCC) 04/2014; 09/2014(/notes 04/25/2015);  12/2014; 01/2015; 02/2015   "left side weakness; speech problems since" (03/19/2015)  . Stroke [facility](HCC) 03/19/2015   "left side weakness; speech problems" (03/19/2015)    Encounter Details: CNP Questionnaire - 11/15/17 1659      Questionnaire   Patient Status  Not Applicable    Race  Black or African American    Location Patient Served At  Not Applicable    Insurance  Medicaid;Medicare    Uninsured  Not Applicable    Food  Yes, have food insecurities;Within past 12 months, worried food would run out with no money to buy more;Within past 12 months, food ran out with no money to buy more    Housing/Utilities  No permanent housing    Transportation  Yes, need transportation assistance;Within past 12 months, lack of transportation negatively impacted life    Interpersonal Safety  No, do not feel physically and emotionally safe where you currently live    Medication  Yes, have medication insecurities    Medical Provider  No    Referrals  Primary Care Provider/Clinic;Behavioral/Mental Health Provider    ED Visit Averted  Not Applicable    Life-Saving Intervention Made  Not Applicable      states she arrived in the [place] area a few days ago.  States has been off all  of her medications and "wants to get back on them"  Is on multiple medications, including Plavix and insulin.  Appointment made with [facility] for August 1 at 2:30

## 2017-11-29 MED FILL — traZODone HCL 150 MG TABS: 150 | 30 days supply | Qty: 30 | Fill #0

## 2017-11-29 MED FILL — risperiDONE 0.25 MG TABS: 0.25 | 30 days supply | Qty: 60 | Fill #0

## 2017-11-29 MED FILL — CLOPIDOGREL 75 MG TABLET: 75 | 30 days supply | Qty: 30 | Fill #0

## 2017-11-29 MED FILL — FAMOTIDINE 20 MG TABLET: 20 | 30 days supply | Qty: 60 | Fill #0

## 2017-11-29 MED FILL — ATORVASTATIN 40 MG TABLET: 40 | 30 days supply | Qty: 30 | Fill #0

## 2017-11-29 MED FILL — GABAPENTIN 300 MG CAPSULE: 300 | 30 days supply | Qty: 90 | Fill #0

## 2017-12-08 NOTE — Congregational Nurse Program (Signed)
Congregational Nurse Program Note  Date of Encounter: 11/29/2017  Past Medical History: Past Medical History:  Diagnosis Date  . Anemia   . Anginal pain (HCC)   . Bipolar disorder (HCC)   . Bronchial asthma   . Chronic lower back pain   . DM (diabetes mellitus) type II uncontrolled, periph vascular disorder (HCC)    notes 01/03/2015  . DVT (deep venous thrombosis) (HCC) after 2008   "BLE"  . GERD (gastroesophageal reflux disease)   . Heart murmur    "born w/one"  . Hyperlipemia   . Hypertension   . Migraine    "weekly" (03/19/2015)  . PFO (patent foramen ovale)    large, per TEE 03/01/2015  . Schizophrenia (HCC)   . 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 Stroke (HCC) 04/2014; 09/2014(/notes 04/25/2015);  12/2014; 01/2015; 02/2015   "left side weakness; speech problems since" (03/19/2015)  . Stroke [facility](HCC) 03/19/2015   "left side weakness; speech problems" (03/19/2015)    Encounter Details: CNP Questionnaire - 11/29/17 1059      Questionnaire   Patient Status  Not Applicable    Race  Black or African American    Location Patient Served At  Not Applicable    Insurance  Medicaid;Medicare    Uninsured  Not Applicable    Food  Yes, have food insecurities;Within past 12 months, worried food would run out with no money to buy more;Within past 12 months, food ran out with no money to buy more    Housing/Utilities  No permanent housing    Transportation  Yes, need transportation assistance;Within past 12 months, lack of transportation negatively impacted life    Interpersonal Safety  No, do not feel physically and emotionally safe where you currently live    Medication  Yes, have medication insecurities;Provided medication assistance    Medical Provider  No    Referrals  Primary Care Provider/Clinic;Behavioral/Mental Health Provider    ED Visit Averted  Not Applicable    Life-Saving Intervention Made  Not Applicable      Client states lost her scripts provided by the ED.   Medications called in by Dr. Shan LevansPatrick Wright.

## 2017-12-08 NOTE — Congregational Nurse Program (Signed)
Congregational Nurse Program Note  Date of Encounter: 11/30/2017  Past Medical History: Past Medical History:  Diagnosis Date  . Anemia   . Anginal pain (HCC)   . Bipolar disorder (HCC)   . Bronchial asthma   . Chronic lower back pain   . DM (diabetes mellitus) type II uncontrolled, periph vascular disorder (HCC)    notes 01/03/2015  . DVT (deep venous thrombosis) (HCC) after 2008   "BLE"  . GERD (gastroesophageal reflux disease)   . Heart murmur    "born w/one"  . Hyperlipemia   . Hypertension   . Migraine    "weekly" (03/19/2015)  . PFO (patent foramen ovale)    large, per TEE 03/01/2015  . Schizophrenia (HCC)   . 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 Stroke (HCC) 04/2014; 09/2014(/notes 04/25/2015);  12/2014; 01/2015; 02/2015   "left side weakness; speech problems since" (03/19/2015)  . Stroke [facility](HCC) 03/19/2015   "left side weakness; speech problems" (03/19/2015)    Encounter Details: CNP Questionnaire - 11/30/17 1102      Questionnaire   Patient Status  Not Applicable    Race  Black or African American    Location Patient Served At  Not Applicable    Insurance  Medicaid;Medicare    Uninsured  Not Applicable    Food  Yes, have food insecurities;Within past 12 months, worried food would run out with no money to buy more;Within past 12 months, food ran out with no money to buy more    Housing/Utilities  No permanent housing    Transportation  Yes, need transportation assistance;Within past 12 months, lack of transportation negatively impacted life    Interpersonal Safety  No, do not feel physically and emotionally safe where you currently live    Medication  Yes, have medication insecurities    Medical Provider  No    Referrals  Primary Care Provider/Clinic;Behavioral/Mental Health Provider    ED Visit Averted  Not Applicable    Life-Saving Intervention Made  Not Applicable       Medications delivered to client.  Reminded client about upcoming appointment with  [facility] August 1.  Reminded her to RTC before then for bus passes

## 2017-12-09 ENCOUNTER — Ambulatory Visit (INDEPENDENT_AMBULATORY_CARE_PROVIDER_SITE_OTHER): Payer: Self-pay | Admitting: Physician Assistant

## 2018-01-03 NOTE — Congregational Nurse Program (Signed)
Request[place].  List of medications provided

## 2020-01-11 ENCOUNTER — Emergency Department (HOSPITAL_COMMUNITY)
Admission: EM | Admit: 2020-01-11 | Discharge: 2020-01-12 | Disposition: A | Discharge status: Home / Self Care | Payer: Medicaid - Out of State | Attending: Emergency Medicine | Admitting: Emergency Medicine

## 2020-01-11 ENCOUNTER — Emergency Department (HOSPITAL_COMMUNITY): Payer: Medicaid - Out of State

## 2020-01-11 ENCOUNTER — Encounter (HOSPITAL_COMMUNITY): Payer: Self-pay

## 2020-01-11 DIAGNOSIS — Y999 Unspecified external cause status: Secondary | ICD-10-CM | POA: Insufficient documentation

## 2020-01-11 DIAGNOSIS — Y929 Unspecified place or not applicable: Secondary | ICD-10-CM | POA: Insufficient documentation

## 2020-01-11 DIAGNOSIS — I10 Essential (primary) hypertension: Secondary | ICD-10-CM | POA: Diagnosis not present

## 2020-01-11 DIAGNOSIS — F17210 Nicotine dependence, cigarettes, uncomplicated: Secondary | ICD-10-CM | POA: Diagnosis not present

## 2020-01-11 DIAGNOSIS — J45909 Unspecified asthma, uncomplicated: Secondary | ICD-10-CM | POA: Insufficient documentation

## 2020-01-11 DIAGNOSIS — Z760 Encounter for issue of repeat prescription: Secondary | ICD-10-CM | POA: Insufficient documentation

## 2020-01-11 DIAGNOSIS — Z7951 Long term (current) use of inhaled steroids: Secondary | ICD-10-CM | POA: Diagnosis not present

## 2020-01-11 DIAGNOSIS — E119 Type 2 diabetes mellitus without complications: Secondary | ICD-10-CM | POA: Insufficient documentation

## 2020-01-11 DIAGNOSIS — M25561 Pain in right knee: Secondary | ICD-10-CM | POA: Diagnosis present

## 2020-01-11 DIAGNOSIS — Z7901 Long term (current) use of anticoagulants: Secondary | ICD-10-CM | POA: Insufficient documentation

## 2020-01-11 DIAGNOSIS — Z794 Long term (current) use of insulin: Secondary | ICD-10-CM | POA: Insufficient documentation
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
## 2020-01-12 LAB — CBG MONITORING, ED: Glucose-Capillary: 141 mg/dL — ABNORMAL HIGH (ref 70–99)

## 2020-01-12 MED ORDER — CLOPIDOGREL BISULFATE 75 MG PO TABS
75.0000 mg | ORAL_TABLET | Freq: Every day | ORAL | Status: DC
  Filled 2020-01-12: qty 1

## 2020-01-12 MED ORDER — CLOPIDOGREL BISULFATE 75 MG PO TABS: 75.0000 mg | ORAL_TABLET | Freq: Every day | ORAL | 0 refills | Status: AC

## 2020-01-12 MED ORDER — FAMOTIDINE 20 MG PO TABS
20.0000 mg | ORAL_TABLET | Freq: Two times a day (BID) | ORAL | Status: DC
  Filled 2020-01-12: qty 1

## 2020-01-12 MED ORDER — INSULIN DETEMIR 100 UNIT/ML ~~LOC~~ SOLN
10.0000 [IU] | Freq: Every day | SUBCUTANEOUS | Status: DC
  Filled 2020-01-12: qty 0.1

## 2020-01-12 MED ORDER — ALBUTEROL SULFATE HFA 108 (90 BASE) MCG/ACT IN AERS: 2.0000 | INHALATION_SPRAY | Freq: Four times a day (QID) | RESPIRATORY_TRACT | Status: DC | PRN

## 2020-01-12 MED ORDER — QUETIAPINE FUMARATE 300 MG PO TABS
300.0000 mg | ORAL_TABLET | Freq: Every day | ORAL | Status: DC
  Filled 2020-01-12: qty 1

## 2020-01-12 MED ORDER — QUETIAPINE FUMARATE 300 MG PO TABS: 300.0000 mg | ORAL_TABLET | Freq: Every day | ORAL | 0 refills | Status: AC

## 2020-01-12 MED ORDER — FAMOTIDINE 20 MG PO TABS: 20.0000 mg | ORAL_TABLET | Freq: Two times a day (BID) | ORAL | 0 refills | Status: AC

## 2020-01-12 MED ORDER — INSULIN DETEMIR 100 UNIT/ML ~~LOC~~ SOLN: 10.0000 [IU] | Freq: Every day | SUBCUTANEOUS | 0 refills | Status: AC

## 2020-01-12 MED ORDER — ASPIRIN 325 MG PO TBEC: 325.0000 mg | DELAYED_RELEASE_TABLET | Freq: Every day | ORAL | 0 refills | Status: AC

## 2020-01-12 MED ORDER — RISPERIDONE 0.25 MG PO TABS: 0.2500 mg | ORAL_TABLET | Freq: Two times a day (BID) | ORAL | 0 refills | Status: AC

## 2020-01-12 MED ORDER — ATORVASTATIN CALCIUM 40 MG PO TABS: 40.0000 mg | ORAL_TABLET | Freq: Every day | ORAL | 0 refills | Status: AC

## 2020-01-12 MED ORDER — ATORVASTATIN CALCIUM 40 MG PO TABS: 40.0000 mg | ORAL_TABLET | Freq: Every day | ORAL | Status: DC

## 2020-01-12 MED ORDER — TRAZODONE HCL 50 MG PO TABS: 150.0000 mg | ORAL_TABLET | Freq: Every day | ORAL | Status: DC

## 2020-01-12 MED ORDER — ACETAMINOPHEN 500 MG PO TABS
500.0000 mg | ORAL_TABLET | Freq: Four times a day (QID) | ORAL | Status: DC | PRN
  Filled 2020-01-12: qty 1

## 2020-01-12 MED ORDER — GABAPENTIN 300 MG PO CAPS: 900.0000 mg | ORAL_CAPSULE | Freq: Three times a day (TID) | ORAL | 0 refills | Status: AC

## 2020-01-12 MED ORDER — TRAZODONE HCL 150 MG PO TABS: 150.0000 mg | ORAL_TABLET | Freq: Every day | ORAL | 0 refills | Status: AC

## 2020-01-12 MED ORDER — RISPERIDONE 0.25 MG PO TABS
0.2500 mg | ORAL_TABLET | Freq: Two times a day (BID) | ORAL | Status: DC
  Filled 2020-01-12 (×3): qty 1

## 2020-01-12 MED ORDER — GABAPENTIN 300 MG PO CAPS
900.0000 mg | ORAL_CAPSULE | Freq: Three times a day (TID) | ORAL | Status: DC
  Filled 2020-01-12: qty 3

## 2020-01-12 MED ORDER — ASPIRIN EC 325 MG PO TBEC
325.0000 mg | DELAYED_RELEASE_TABLET | Freq: Every day | ORAL | Status: DC
  Filled 2020-01-12: qty 1

## 2020-01-12 NOTE — ED Notes (Signed)
Pt ate breakfast and then asked for sandwich and OJ I gave it to pt.

## 2020-01-12 NOTE — ED Notes (Signed)
Pt has complaints of left hand pain, which is a symptom form a previous stroke. Pt is requesting a hand brace.  PA made aware.

## 2020-01-12 NOTE — Discharge Instructions (Signed)
Continue taking all medications as prescribed.  I gave you a 1 month supply of all your medicines, you will need to follow-up with a primary care doctor for further refills.   Take Tylenol or ibuprofen as needed for pain for your knee.  Return to the emergency room with any new, worsening, concerning symptoms.
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
## 2020-01-17 ENCOUNTER — Telehealth (HOSPITAL_COMMUNITY): Payer: Self-pay | Admitting: Student

## 2020-01-17 MED ORDER — ALBUTEROL SULFATE HFA 108 (90 BASE) MCG/ACT IN AERS: 2.0000 | INHALATION_SPRAY | Freq: Four times a day (QID) | RESPIRATORY_TRACT | 0 refills | Status: AC | PRN

## 2020-01-17 NOTE — Telephone Encounter (Signed)
Pt called the ED asking for a refill of albuterol inhaler, stating she lost the one given while in the ED last week. I personally saw pt on 9/3. She has a h/o asthma. [place] issues. Will call in refill of albuterol inhaler to [facility] in [place], per pt request.
